# Patient Record
Sex: Female | Born: 1955 | Race: White | Hispanic: No | Marital: Married | Smoking: Never smoker
Health system: Southern US, Community
[De-identification: ages and names within clinical notes are randomized; demographics above are authoritative.]

## PROBLEM LIST (undated history)

## (undated) DIAGNOSIS — R87619 Unspecified abnormal cytological findings in specimens from cervix uteri: Secondary | ICD-10-CM

## (undated) DIAGNOSIS — I10 Essential (primary) hypertension: Secondary | ICD-10-CM

## (undated) DIAGNOSIS — E785 Hyperlipidemia, unspecified: Secondary | ICD-10-CM

## (undated) DIAGNOSIS — K861 Other chronic pancreatitis: Secondary | ICD-10-CM

## (undated) DIAGNOSIS — I491 Atrial premature depolarization: Secondary | ICD-10-CM

## (undated) DIAGNOSIS — K219 Gastro-esophageal reflux disease without esophagitis: Secondary | ICD-10-CM

## (undated) HISTORY — DX: Hyperlipidemia, unspecified: E78.5

## (undated) HISTORY — DX: Gastro-esophageal reflux disease without esophagitis: K21.9

## (undated) HISTORY — DX: Other chronic pancreatitis: K86.1

## (undated) HISTORY — PX: CERVICAL CONE BIOPSY: SUR198

## (undated) HISTORY — DX: Essential (primary) hypertension: I10

## (undated) HISTORY — DX: Atrial premature depolarization: I49.1

## (undated) HISTORY — DX: Unspecified abnormal cytological findings in specimens from cervix uteri: R87.619

---

## 1998-12-12 ENCOUNTER — Encounter: Admission: RE | Admit: 1998-12-12 | Discharge: 1998-12-12 | Payer: Self-pay | Admitting: Family Medicine

## 1998-12-12 ENCOUNTER — Encounter: Payer: Self-pay | Admitting: Family Medicine

## 2001-04-17 ENCOUNTER — Encounter: Admission: RE | Admit: 2001-04-17 | Discharge: 2001-04-17 | Payer: Self-pay | Admitting: Family Medicine

## 2001-04-17 ENCOUNTER — Encounter: Payer: Self-pay | Admitting: Family Medicine

## 2002-04-26 ENCOUNTER — Encounter: Payer: Self-pay | Admitting: Family Medicine

## 2002-04-26 ENCOUNTER — Encounter: Admission: RE | Admit: 2002-04-26 | Discharge: 2002-04-26 | Payer: Self-pay | Admitting: Family Medicine

## 2003-05-09 ENCOUNTER — Other Ambulatory Visit: Admission: RE | Admit: 2003-05-09 | Discharge: 2003-05-09 | Payer: Self-pay | Admitting: Family Medicine

## 2003-05-31 ENCOUNTER — Encounter: Admission: RE | Admit: 2003-05-31 | Discharge: 2003-05-31 | Payer: Self-pay | Admitting: Family Medicine

## 2004-01-30 ENCOUNTER — Ambulatory Visit (HOSPITAL_COMMUNITY): Admission: RE | Admit: 2004-01-30 | Discharge: 2004-01-30 | Payer: Self-pay | Admitting: Gastroenterology

## 2004-01-30 ENCOUNTER — Encounter (INDEPENDENT_AMBULATORY_CARE_PROVIDER_SITE_OTHER): Payer: Self-pay | Admitting: Specialist

## 2004-07-05 ENCOUNTER — Other Ambulatory Visit: Admission: RE | Admit: 2004-07-05 | Discharge: 2004-07-05 | Payer: Self-pay | Admitting: Gynecology

## 2005-03-12 ENCOUNTER — Encounter: Admission: RE | Admit: 2005-03-12 | Discharge: 2005-03-12 | Payer: Self-pay | Admitting: Family Medicine

## 2005-09-04 ENCOUNTER — Other Ambulatory Visit: Admission: RE | Admit: 2005-09-04 | Discharge: 2005-09-04 | Payer: Self-pay | Admitting: Family Medicine

## 2006-03-18 ENCOUNTER — Encounter: Admission: RE | Admit: 2006-03-18 | Discharge: 2006-03-18 | Payer: Self-pay | Admitting: Family Medicine

## 2006-09-02 ENCOUNTER — Other Ambulatory Visit: Admission: RE | Admit: 2006-09-02 | Discharge: 2006-09-02 | Payer: Self-pay | Admitting: Family Medicine

## 2007-04-21 ENCOUNTER — Encounter: Admission: RE | Admit: 2007-04-21 | Discharge: 2007-04-21 | Payer: Self-pay | Admitting: Family Medicine

## 2007-07-09 ENCOUNTER — Encounter: Admission: RE | Admit: 2007-07-09 | Discharge: 2007-07-09 | Payer: Self-pay | Admitting: Gastroenterology

## 2007-07-13 ENCOUNTER — Ambulatory Visit (HOSPITAL_COMMUNITY): Admission: RE | Admit: 2007-07-13 | Discharge: 2007-07-13 | Payer: Self-pay | Admitting: Gastroenterology

## 2007-11-05 ENCOUNTER — Other Ambulatory Visit: Admission: RE | Admit: 2007-11-05 | Discharge: 2007-11-05 | Payer: Self-pay | Admitting: Family Medicine

## 2008-04-26 ENCOUNTER — Encounter: Admission: RE | Admit: 2008-04-26 | Discharge: 2008-04-26 | Payer: Self-pay | Admitting: Family Medicine

## 2008-12-01 ENCOUNTER — Encounter: Admission: RE | Admit: 2008-12-01 | Discharge: 2008-12-01 | Payer: Self-pay | Admitting: Family Medicine

## 2009-01-16 ENCOUNTER — Other Ambulatory Visit: Admission: RE | Admit: 2009-01-16 | Discharge: 2009-01-16 | Payer: Self-pay | Admitting: Family Medicine

## 2009-05-17 ENCOUNTER — Encounter: Admission: RE | Admit: 2009-05-17 | Discharge: 2009-05-17 | Payer: Self-pay | Admitting: Family Medicine

## 2009-10-09 ENCOUNTER — Emergency Department (HOSPITAL_BASED_OUTPATIENT_CLINIC_OR_DEPARTMENT_OTHER): Admission: EM | Admit: 2009-10-09 | Discharge: 2009-10-09 | Payer: Self-pay | Admitting: Emergency Medicine

## 2010-04-02 ENCOUNTER — Other Ambulatory Visit: Payer: Self-pay | Admitting: Dermatology

## 2010-04-24 ENCOUNTER — Other Ambulatory Visit: Payer: Self-pay | Admitting: Family Medicine

## 2010-04-24 DIAGNOSIS — Z1231 Encounter for screening mammogram for malignant neoplasm of breast: Secondary | ICD-10-CM

## 2010-05-10 ENCOUNTER — Ambulatory Visit
Admission: RE | Admit: 2010-05-10 | Discharge: 2010-05-10 | Disposition: A | Discharge status: Home / Self Care | Payer: Commercial Indemnity | Source: Ambulatory Visit | Attending: Family Medicine | Admitting: Family Medicine

## 2010-05-10 DIAGNOSIS — Z1231 Encounter for screening mammogram for malignant neoplasm of breast: Secondary | ICD-10-CM

## 2010-05-11 LAB — DIFFERENTIAL
Basophils Absolute: 0 10*3/uL (ref 0.0–0.1)
Basophils Relative: 1 % (ref 0–1)
Eosinophils Absolute: 0 10*3/uL (ref 0.0–0.7)
Eosinophils Relative: 1 % (ref 0–5)
Lymphocytes Relative: 39 % (ref 12–46)
Lymphs Abs: 1.4 10*3/uL (ref 0.7–4.0)
Monocytes Absolute: 0.3 10*3/uL (ref 0.1–1.0)
Monocytes Relative: 7 % (ref 3–12)
Neutro Abs: 2 10*3/uL (ref 1.7–7.7)
Neutrophils Relative %: 53 % (ref 43–77)

## 2010-05-11 LAB — BASIC METABOLIC PANEL
BUN: 15 mg/dL (ref 6–23)
CO2: 20 mEq/L (ref 19–32)
Calcium: 9.7 mg/dL (ref 8.4–10.5)
Chloride: 107 mEq/L (ref 96–112)
Creatinine, Ser: 0.9 mg/dL (ref 0.4–1.2)
GFR calc Af Amer: >60 mL/min (ref >60)
GFR calc non Af Amer: >60 mL/min (ref >60)
Glucose, Bld: 92 mg/dL (ref 70–99)
Potassium: 3.2 mEq/L — ABNORMAL LOW (ref 3.5–5.1)
Sodium: 145 mEq/L (ref 135–145)

## 2010-05-11 LAB — CBC
HCT: 37.9 % (ref 36.0–46.0)
Hemoglobin: 13.1 g/dL (ref 12.0–15.0)
MCH: 31.4 pg (ref 26.0–34.0)
MCHC: 34.6 g/dL (ref 30.0–36.0)
MCV: 90.9 fL (ref 78.0–100.0)
Platelets: 148 10*3/uL — ABNORMAL LOW (ref 150–400)
RBC: 4.17 MIL/uL (ref 3.87–5.11)
RDW: 12.4 % (ref 11.5–15.5)
WBC: 3.7 10*3/uL — ABNORMAL LOW (ref 4.0–10.5)

## 2010-05-11 LAB — POCT CARDIAC MARKERS
CKMB, poc: <1 ng/mL — ABNORMAL LOW (ref 1.0–8.0)
CKMB, poc: <1 ng/mL — ABNORMAL LOW (ref 1.0–8.0)
Myoglobin, poc: 50.4 ng/mL (ref 12–200)
Myoglobin, poc: 62.8 ng/mL (ref 12–200)
Troponin i, poc: <0.05 ng/mL (ref 0.00–0.09)
Troponin i, poc: <0.05 ng/mL (ref 0.00–0.09)

## 2010-05-11 LAB — GLUCOSE, CAPILLARY: Glucose-Capillary: 100 mg/dL — ABNORMAL HIGH (ref 70–99)

## 2010-05-11 LAB — D-DIMER, QUANTITATIVE: D-Dimer, Quant: 0.32 ug/mL-FEU (ref 0.00–0.48)

## 2010-05-31 ENCOUNTER — Other Ambulatory Visit: Payer: Self-pay | Admitting: Family Medicine

## 2010-05-31 ENCOUNTER — Other Ambulatory Visit (HOSPITAL_COMMUNITY)
Admission: RE | Admit: 2010-05-31 | Discharge: 2010-05-31 | Disposition: A | Discharge status: Home / Self Care | Payer: Commercial Indemnity | Source: Ambulatory Visit | Attending: Family Medicine | Admitting: Family Medicine

## 2010-05-31 DIAGNOSIS — Z1159 Encounter for screening for other viral diseases: Secondary | ICD-10-CM | POA: Insufficient documentation

## 2010-05-31 DIAGNOSIS — Z124 Encounter for screening for malignant neoplasm of cervix: Secondary | ICD-10-CM | POA: Insufficient documentation

## 2010-06-12 ENCOUNTER — Other Ambulatory Visit: Payer: Self-pay | Admitting: Family Medicine

## 2010-06-12 ENCOUNTER — Ambulatory Visit
Admission: RE | Admit: 2010-06-12 | Discharge: 2010-06-12 | Disposition: A | Discharge status: Home / Self Care | Payer: Commercial Indemnity | Source: Ambulatory Visit | Attending: Family Medicine | Admitting: Family Medicine

## 2010-06-28 ENCOUNTER — Other Ambulatory Visit: Payer: Self-pay | Admitting: Family Medicine

## 2010-06-28 DIAGNOSIS — R1013 Epigastric pain: Secondary | ICD-10-CM

## 2010-07-03 ENCOUNTER — Ambulatory Visit
Admission: RE | Admit: 2010-07-03 | Discharge: 2010-07-03 | Disposition: A | Discharge status: Home / Self Care | Payer: Commercial Indemnity | Source: Ambulatory Visit | Attending: Family Medicine | Admitting: Family Medicine

## 2010-07-03 DIAGNOSIS — R1013 Epigastric pain: Secondary | ICD-10-CM

## 2010-07-03 MED ORDER — IOHEXOL 300 MG/ML  SOLN
100.0000 mL | Freq: Once | INTRAMUSCULAR | Status: AC | PRN
  Administered 2010-07-03: 100 mL via INTRAVENOUS

## 2010-07-04 ENCOUNTER — Other Ambulatory Visit (HOSPITAL_COMMUNITY): Payer: Self-pay | Admitting: Family Medicine

## 2010-07-04 DIAGNOSIS — R109 Unspecified abdominal pain: Secondary | ICD-10-CM

## 2010-07-10 NOTE — Op Note (Signed)
Felicia Obrien, Felicia Obrien           ACCOUNT NO.:  [id number]   MEDICAL RECORD NO.:  [id number]          PATIENT TYPE:  AMB   LOCATION:  ENDO                         FACILITY:  [facility]   PHYSICIAN:  Graylin Shiver, M.D.   DATE OF BIRTH:  [birth year]   DATE OF PROCEDURE:  07/13/2007  DATE OF DISCHARGE:                               OPERATIVE REPORT   INDICATIONS FOR PROCEDURE:  Dysphagia to solid foods, abnormal barium  swallow showing a Schatzki's ring which obstructed a 13-mm barium  tablet.   Informed consent was obtained after explanation of the risks of  bleeding, infection and perforation.   PREMEDICATION:  1. Fentanyl 100 mcg IV.  2. Versed 10 mg IV.   PROCEDURE:  With the patient in the left lateral decubitus position, the  Pentax gastroscope passed into the esophagus.  It was advanced down the  esophagus and then into the stomach and into the duodenum.  The second  portion and bulb of the duodenum looked normal.  The stomach mucosa  looked normal.  The esophagus was inspected.  There was a Schatzki's  ring.  This was at about 36 cm.  An endoscopic balloon dilator was  advanced down the scope.  It was appropriately placed at the level of  the ring.  It was dilated to 15 then 16.5 then 18 mm.  The balloon was  held in place at each level for 1 minute.  The balloon was then  deflated.  There was some blood noted at the site of the dilatation but  no other abnormalities.  The rest of the esophagus looked normal.  She  tolerated the procedure well without complications.   IMPRESSION:  Distal esophageal Schatzki's ring dilated to 18 mm.   PLAN:  We will observe the response to the dilatation.           ______________________________  Graylin Shiver, M.D.     SFG/MEDQ  D:  07/13/2007  T:  07/13/2007  Job:  [id number]   cc:   Caryn Bee L. Little, M.D.  Fax: [phone]

## 2010-07-13 NOTE — Op Note (Signed)
NAMELAKEIDRA, Felicia Obrien           ACCOUNT NO.:  [id number]   MEDICAL RECORD NO.:  [id number]          PATIENT TYPE:  AMB   LOCATION:  ENDO                         FACILITY:  [facility]   PHYSICIAN:  Graylin Shiver, M.D.   DATE OF BIRTH:  Dec 27, 1955   DATE OF PROCEDURE:  01/30/2004  DATE OF DISCHARGE:                                 OPERATIVE REPORT   PROCEDURE:  Colonoscopy with polypectomy and biopsy.   SURGEON:   INDICATIONS FOR PROCEDURE:  Rectal bleeding and family history of colon  polyps.   Informed consent was obtained after explanation of the risks of bleeding,  infection and perforation.   PREMEDICATION:  Fentanyl 100 mcg IV and Versed 10 mg IV.   DESCRIPTION OF PROCEDURE:  With the patient in the left lateral decubitus  position, a rectal examination was performed.  No masses were felt.  There  were external hemorrhoidal tags.  The Olympus colonoscope was inserted into  the rectum and advanced into the sigmoid colon.  In the mid sigmoid colon,  there was a 5 mm sessile polyp which was snared with a mini snare and  removed by snare cautery technique.  Cautery site looked good and the polyp  was retrieved.  In the proximal sigmoid, there was a small 3 mm polyp  biopsied off with cold forceps.  The scope was advanced around  the colon to  the cecum.  The cecal landmarks were identified.  The cecum and ascending  colon looked normal.  The transverse colon looked normal.  The descending  colon looked normal.  The rectum looked normal.  The scope was retroflexed  in the rectum.  No abnormalities were seen.  The scope was removed.  The  patient tolerated the procedure well without complications.   IMPRESSION:  1.  External hemorrhoids.  2.  Two small polyps in the sigmoid which were removed.   PLAN:  The pathology will be checked.      SFG/MEDQ  D:  01/30/2004  T:  01/30/2004  Job:  [id number]   cc:   Caryn Bee L. Little, M.D.  [address]  [place]  [place]  [address]  Fax: [phone]

## 2010-07-24 ENCOUNTER — Other Ambulatory Visit (HOSPITAL_COMMUNITY): Payer: Commercial Indemnity

## 2011-06-04 ENCOUNTER — Other Ambulatory Visit: Payer: Self-pay | Admitting: Family Medicine

## 2011-06-04 DIAGNOSIS — Z1231 Encounter for screening mammogram for malignant neoplasm of breast: Secondary | ICD-10-CM

## 2011-07-03 ENCOUNTER — Ambulatory Visit: Payer: Commercial Indemnity

## 2011-07-16 ENCOUNTER — Ambulatory Visit
Admission: RE | Admit: 2011-07-16 | Discharge: 2011-07-16 | Disposition: A | Discharge status: Home / Self Care | Payer: Commercial Indemnity | Source: Ambulatory Visit | Attending: Family Medicine | Admitting: Family Medicine

## 2011-07-16 DIAGNOSIS — Z1231 Encounter for screening mammogram for malignant neoplasm of breast: Secondary | ICD-10-CM

## 2012-07-23 ENCOUNTER — Other Ambulatory Visit: Payer: Self-pay

## 2012-07-23 DIAGNOSIS — Z1231 Encounter for screening mammogram for malignant neoplasm of breast: Secondary | ICD-10-CM

## 2012-08-26 ENCOUNTER — Ambulatory Visit
Admission: RE | Admit: 2012-08-26 | Discharge: 2012-08-26 | Disposition: A | Discharge status: Home / Self Care | Payer: Commercial Indemnity | Source: Ambulatory Visit

## 2012-08-26 DIAGNOSIS — Z1231 Encounter for screening mammogram for malignant neoplasm of breast: Secondary | ICD-10-CM

## 2013-06-01 ENCOUNTER — Encounter: Payer: Commercial Indemnity | Admitting: Interventional Cardiology

## 2013-08-18 ENCOUNTER — Other Ambulatory Visit: Payer: Self-pay | Admitting: Family Medicine

## 2013-08-18 DIAGNOSIS — N644 Mastodynia: Secondary | ICD-10-CM

## 2013-08-26 ENCOUNTER — Ambulatory Visit
Admission: RE | Admit: 2013-08-26 | Discharge: 2013-08-26 | Disposition: A | Discharge status: Home / Self Care | Payer: Managed Care, Other (non HMO) | Source: Ambulatory Visit | Attending: Family Medicine | Admitting: Family Medicine

## 2013-08-26 ENCOUNTER — Encounter (INDEPENDENT_AMBULATORY_CARE_PROVIDER_SITE_OTHER): Payer: Self-pay

## 2013-08-26 DIAGNOSIS — N644 Mastodynia: Secondary | ICD-10-CM

## 2013-12-24 ENCOUNTER — Other Ambulatory Visit: Payer: Self-pay | Admitting: Family Medicine

## 2013-12-24 DIAGNOSIS — N63 Unspecified lump in unspecified breast: Secondary | ICD-10-CM

## 2014-03-01 ENCOUNTER — Ambulatory Visit
Admission: RE | Admit: 2014-03-01 | Discharge: 2014-03-01 | Disposition: A | Discharge status: Home / Self Care | Payer: Managed Care, Other (non HMO) | Source: Ambulatory Visit | Attending: Family Medicine | Admitting: Family Medicine

## 2014-03-01 DIAGNOSIS — N63 Unspecified lump in unspecified breast: Secondary | ICD-10-CM

## 2014-12-22 ENCOUNTER — Other Ambulatory Visit: Payer: Self-pay

## 2014-12-22 DIAGNOSIS — Z1231 Encounter for screening mammogram for malignant neoplasm of breast: Secondary | ICD-10-CM

## 2015-01-13 ENCOUNTER — Ambulatory Visit
Admission: RE | Admit: 2015-01-13 | Discharge: 2015-01-13 | Disposition: A | Discharge status: Home / Self Care | Payer: Managed Care, Other (non HMO) | Source: Ambulatory Visit

## 2015-01-13 DIAGNOSIS — Z1231 Encounter for screening mammogram for malignant neoplasm of breast: Secondary | ICD-10-CM

## 2015-02-02 ENCOUNTER — Emergency Department (HOSPITAL_BASED_OUTPATIENT_CLINIC_OR_DEPARTMENT_OTHER): Payer: Managed Care, Other (non HMO)

## 2015-02-02 ENCOUNTER — Emergency Department (HOSPITAL_BASED_OUTPATIENT_CLINIC_OR_DEPARTMENT_OTHER)
Admission: EM | Admit: 2015-02-02 | Discharge: 2015-02-02 | Disposition: A | Discharge status: Home / Self Care | Payer: Managed Care, Other (non HMO) | Attending: Emergency Medicine | Admitting: Emergency Medicine

## 2015-02-02 ENCOUNTER — Encounter (HOSPITAL_BASED_OUTPATIENT_CLINIC_OR_DEPARTMENT_OTHER): Payer: Self-pay

## 2015-02-02 DIAGNOSIS — L729 Follicular cyst of the skin and subcutaneous tissue, unspecified: Secondary | ICD-10-CM

## 2015-02-02 DIAGNOSIS — Z8719 Personal history of other diseases of the digestive system: Secondary | ICD-10-CM | POA: Diagnosis not present

## 2015-02-02 DIAGNOSIS — Z7982 Long term (current) use of aspirin: Secondary | ICD-10-CM | POA: Insufficient documentation

## 2015-02-02 DIAGNOSIS — Z79899 Other long term (current) drug therapy: Secondary | ICD-10-CM | POA: Insufficient documentation

## 2015-02-02 DIAGNOSIS — M79605 Pain in left leg: Secondary | ICD-10-CM | POA: Diagnosis present

## 2015-02-02 DIAGNOSIS — E785 Hyperlipidemia, unspecified: Secondary | ICD-10-CM | POA: Diagnosis not present

## 2015-02-02 DIAGNOSIS — I10 Essential (primary) hypertension: Secondary | ICD-10-CM | POA: Insufficient documentation

## 2015-02-02 LAB — CBC WITH DIFFERENTIAL/PLATELET
Basophils Absolute: 0 10*3/uL (ref 0.0–0.1)
Basophils Relative: 0 %
Eosinophils Absolute: 0 10*3/uL (ref 0.0–0.7)
Eosinophils Relative: 1 %
HCT: 37.5 % (ref 36.0–46.0)
Hemoglobin: 12.4 g/dL (ref 12.0–15.0)
Lymphocytes Relative: 32 %
Lymphs Abs: 1.4 10*3/uL (ref 0.7–4.0)
MCH: 30.5 pg (ref 26.0–34.0)
MCHC: 33.1 g/dL (ref 30.0–36.0)
MCV: 92.1 fL (ref 78.0–100.0)
Monocytes Absolute: 0.3 10*3/uL (ref 0.1–1.0)
Monocytes Relative: 7 %
Neutro Abs: 2.5 10*3/uL (ref 1.7–7.7)
Neutrophils Relative %: 60 %
Platelets: 146 10*3/uL — ABNORMAL LOW (ref 150–400)
RBC: 4.07 MIL/uL (ref 3.87–5.11)
RDW: 12.2 % (ref 11.5–15.5)
WBC: 4.2 10*3/uL (ref 4.0–10.5)

## 2015-02-02 LAB — BASIC METABOLIC PANEL
Anion gap: 6 (ref 5–15)
BUN: 17 mg/dL (ref 6–20)
CO2: 26 mmol/L (ref 22–32)
Calcium: 9.4 mg/dL (ref 8.9–10.3)
Chloride: 108 mmol/L (ref 101–111)
Creatinine, Ser: 0.72 mg/dL (ref 0.44–1.00)
GFR calc Af Amer: >60 mL/min (ref >60)
GFR calc non Af Amer: >60 mL/min (ref >60)
Glucose, Bld: 98 mg/dL (ref 65–99)
Potassium: 3.9 mmol/L (ref 3.5–5.1)
Sodium: 140 mmol/L (ref 135–145)

## 2015-02-02 MED ORDER — DOXYCYCLINE HYCLATE 100 MG PO CAPS: 100.0000 mg | ORAL_CAPSULE | Freq: Two times a day (BID) | ORAL | Status: DC

## 2015-02-02 NOTE — ED Provider Notes (Signed)
CSN: [id number]     Arrival date & time 02/02/15  1558 History   First MD Initiated Contact with Patient 02/02/15 1627     Chief Complaint  Patient presents with  . Leg Pain     (Consider location/radiation/quality/duration/timing/severity/associated sxs/prior Treatment) Patient is a 59 y.o. female presenting with leg pain. The history is provided by the patient.  Leg Pain Associated symptoms: no fever    patient with a complaint of 2 bumps or nodules on her left leg one on the anterior part of the leg get a part on the medial part of the leg. Once been present for over a week the others been present for about 4 days. Denies any foot or ankle swelling. There is some redness to the 2 bumps. Patient is not sure what they're from. Never anything like this before. Denies any fevers. No joint aches. Mildly uncomfortable but no severe pain. In general to an ache and occasionally is sharp pain. No shortness of breath no chest pain.  Past Medical History  Diagnosis Date  . Hyperlipidemia   . PAC (premature atrial contraction)   . Hypertension   . GERD (gastroesophageal reflux disease)   . Abnormal Pap smear of cervix    Past Surgical History  Procedure Laterality Date  . Cervical cone biopsy     Family History  Problem Relation Age of Onset  . Cancer Mother   . Hyperlipidemia Father   . Hypertension Father   . Hyperlipidemia Sister   . Hyperlipidemia Brother    Social History  Substance Use Topics  . Smoking status: Never Smoker   . Smokeless tobacco: None  . Alcohol Use: No   OB History    No data available     Review of Systems  Constitutional: Negative for fever.  HENT: Negative for congestion.   Eyes: Negative for redness.  Cardiovascular: Positive for leg swelling. Negative for chest pain.  Gastrointestinal: Negative for abdominal pain.  Genitourinary: Negative for dysuria.  Musculoskeletal: Negative for joint swelling.  Skin: Negative for rash.  Neurological:  Negative for headaches.  Hematological: Does not bruise/bleed easily.  Psychiatric/Behavioral: Negative for confusion.      Allergies  Atorvastatin  Home Medications   Prior to Admission medications   Medication Sig Start Date End Date Taking? Authorizing Provider  ALPRAZolam Prudy Feeler) 0.25 MG tablet Take 0.25 mg by mouth at bedtime as needed for anxiety.    Historical Provider, MD  aspirin 81 MG tablet Take 81 mg by mouth daily.    Historical Provider, MD  atenolol (TENORMIN) 50 MG tablet Take 50 mg by mouth daily.    Historical Provider, MD  atorvastatin (LIPITOR) 10 MG tablet Take 10 mg by mouth daily.    Historical Provider, MD  Cholecalciferol (VITAMIN D) 2000 UNITS CAPS Take by mouth.    Historical Provider, MD  doxycycline (VIBRAMYCIN) 100 MG capsule Take 1 capsule (100 mg total) by mouth 2 (two) times daily. 02/02/15   Vanetta Mulders, MD  RA KRILL OIL CAPS Take by mouth.    Historical Provider, MD   BP 156/67 mmHg  Pulse 78  Temp(Src) 97.9 F (36.6 C) (Oral)  Resp 18  Ht  (1.575 m)  Wt 66.679 kg  BMI 26.88 kg/m2  SpO2 100% Physical Exam  Constitutional: She is oriented to person, place, and time. She appears well-developed and well-nourished. No distress.  HENT:  Head: Normocephalic and atraumatic.  Mouth/Throat: Oropharynx is clear and moist.  Eyes: Conjunctivae and EOM  are normal. Pupils are equal, round, and reactive to light.  Neck: Normal range of motion.  Cardiovascular: Normal rate, regular rhythm and normal heart sounds.   No murmur heard. Pulmonary/Chest: Effort normal and breath sounds normal. No respiratory distress.  Abdominal: Soft. Bowel sounds are normal. There is no tenderness.  Musculoskeletal: Normal range of motion. She exhibits tenderness.  2 areas of erythema measuring about 2 cm one is anterior shin the other is medial aspect of the calf. Both have a 1 cm nodule no fluctuance. Slight tenderness. Slight increase in warmth. No evidence of any  varicose veins. Dorsalis pedis pulse distally on the left leg is 2+. Sensation intact. No foot or ankle swelling.  Neurological: She is alert and oriented to person, place, and time. No cranial nerve deficit. She exhibits normal muscle tone. Coordination normal.  Skin: Skin is warm. There is erythema.  Nursing note and vitals reviewed.   ED Course  Procedures (including critical care time) Labs Review Labs Reviewed  CBC WITH DIFFERENTIAL/PLATELET - Abnormal; Notable for the following:    Platelets 146 (*)    All other components within normal limits  BASIC METABOLIC PANEL    Imaging Review [place] Venous Img Lower Unilateral Left  02/02/2015  CLINICAL DATA:  Two new lumps noted on left lower extremity. Initial encounter. EXAM: LEFT LOWER EXTREMITY VENOUS DOPPLER ULTRASOUND TECHNIQUE: Gray-scale sonography with graded compression, as well as color Doppler and duplex ultrasound were performed to evaluate the lower extremity deep venous systems from the level of the common femoral vein and including the common femoral, femoral, profunda femoral, popliteal and calf veins including the posterior tibial, peroneal and gastrocnemius veins when visible. The superficial great saphenous vein was also interrogated. Spectral Doppler was utilized to evaluate flow at rest and with distal augmentation maneuvers in the common femoral, femoral and popliteal veins. COMPARISON:  None. FINDINGS: Contralateral Common Femoral Vein: Respiratory phasicity is normal and symmetric with the symptomatic side. No evidence of thrombus. Normal compressibility. Common Femoral Vein: No evidence of thrombus. Normal compressibility, respiratory phasicity and response to augmentation. Saphenofemoral Junction: No evidence of thrombus. Normal compressibility and flow on color Doppler imaging. Profunda Femoral Vein: No evidence of thrombus. Normal compressibility and flow on color Doppler imaging. Femoral Vein: No evidence of thrombus. Normal  compressibility, respiratory phasicity and response to augmentation. Popliteal Vein: No evidence of thrombus. Normal compressibility, respiratory phasicity and response to augmentation. Calf Veins: No evidence of thrombus. Normal compressibility and flow on color Doppler imaging. Superficial Great Saphenous Vein: No evidence of thrombus. Normal compressibility and flow on color Doppler imaging. Venous Reflux:  None. Other Findings: There are two small mildly heterogeneous hypoechoic masses noted within the subcutaneous fat at the distal anterior lower leg and at the medial aspect of the ankle, measuring 0.6 cm and 0.5 cm. These are nonspecific and demonstrate no associated blood flow on limited Doppler evaluation. IMPRESSION: 1. No evidence of deep venous thrombosis. 2. Two small mildly heterogeneous hypoechoic masses noted within the subcutaneous fat at the distal anterior lower leg and at the medial aspect of the ankle, measuring 0.6 cm and 0.5 cm. These are nonspecific and demonstrate no associated blood flow. Somewhat complex lipomas could have such an appearance, or small pustules could also present in this manner. If the patient's symptoms persist or worsen, MRI could be considered for further evaluation. Electronically Signed   By: Roanna RaiderJeffery  Chang M.D.   On: 02/02/2015 19:12   I have personally reviewed and evaluated these  images and lab results as part of my medical decision-making.   EKG Interpretation None      MDM   Final diagnoses:  Skin cyst    Ultrasound shows no evidence of any deep vein problems or even superficial vein problems. The areas of erythema and nodular areas seem to be in the skin. Would recommend a seven-day course of doxycycline to see if it clears up. And follow-up with regular doctor. To create needs follow-up if they do not resolve or if they get worse. Labs also today without any significant abnormalities.  The nodules may be secondarily infected. Could represent an  inflamed skin cyst. Mild cellulitis noted nothing extensive. No fluctuance.    Vanetta Mulders, MD 02/02/15 [phone]

## 2015-02-02 NOTE — ED Notes (Signed)
Patient transported to Ultrasound 

## 2015-02-02 NOTE — ED Notes (Signed)
C/o 2 "bumps" to left LE-one area x 1-2 weeks-the other 4 days-NAD-steady gait in heels

## 2015-02-02 NOTE — Discharge Instructions (Signed)
Ultrasound of the left leg shows no evidence of any deep vein thrombosis or deep vein abnormalities also shows no evidence of any superficial vein problems at the location of the nodules. They feel that they're in the skin. Could be some mild secondary infection will treat with an antibiotic. Follow-up with your regular doctor would be important. Return for any new or worse symptoms.

## 2015-10-23 ENCOUNTER — Other Ambulatory Visit: Payer: Self-pay | Admitting: Gastroenterology

## 2015-10-23 DIAGNOSIS — R1013 Epigastric pain: Secondary | ICD-10-CM

## 2015-10-24 ENCOUNTER — Other Ambulatory Visit: Payer: Self-pay | Admitting: Gastroenterology

## 2015-10-24 ENCOUNTER — Ambulatory Visit
Admission: RE | Admit: 2015-10-24 | Discharge: 2015-10-24 | Disposition: A | Discharge status: Home / Self Care | Payer: Managed Care, Other (non HMO) | Source: Ambulatory Visit | Attending: Gastroenterology | Admitting: Gastroenterology

## 2015-10-24 DIAGNOSIS — K8689 Other specified diseases of pancreas: Secondary | ICD-10-CM

## 2015-10-24 DIAGNOSIS — R1013 Epigastric pain: Secondary | ICD-10-CM

## 2015-10-26 ENCOUNTER — Other Ambulatory Visit (HOSPITAL_COMMUNITY)
Admission: RE | Admit: 2015-10-26 | Discharge: 2015-10-26 | Disposition: A | Discharge status: Home / Self Care | Payer: Managed Care, Other (non HMO) | Source: Ambulatory Visit | Attending: Family Medicine | Admitting: Family Medicine

## 2015-10-26 ENCOUNTER — Other Ambulatory Visit: Payer: Self-pay | Admitting: Family Medicine

## 2015-10-26 DIAGNOSIS — Z124 Encounter for screening for malignant neoplasm of cervix: Secondary | ICD-10-CM | POA: Diagnosis present

## 2015-10-26 DIAGNOSIS — Z1151 Encounter for screening for human papillomavirus (HPV): Secondary | ICD-10-CM | POA: Diagnosis present

## 2015-10-27 LAB — CYTOLOGY - PAP

## 2015-11-05 ENCOUNTER — Ambulatory Visit
Admission: RE | Admit: 2015-11-05 | Discharge: 2015-11-05 | Disposition: A | Discharge status: Home / Self Care | Payer: Managed Care, Other (non HMO) | Source: Ambulatory Visit | Attending: Gastroenterology | Admitting: Gastroenterology

## 2015-11-05 DIAGNOSIS — K8689 Other specified diseases of pancreas: Secondary | ICD-10-CM

## 2015-11-05 MED ORDER — GADOBENATE DIMEGLUMINE 529 MG/ML IV SOLN
14.0000 mL | Freq: Once | INTRAVENOUS | Status: AC | PRN
  Administered 2015-11-05: 14 mL via INTRAVENOUS

## 2015-11-08 ENCOUNTER — Other Ambulatory Visit: Payer: Managed Care, Other (non HMO)

## 2015-12-20 ENCOUNTER — Inpatient Hospital Stay (HOSPITAL_COMMUNITY)
Admission: EM | Admit: 2015-12-20 | Discharge: 2015-12-25 | DRG: 393 | Disposition: A | Discharge status: Home / Self Care | Payer: Managed Care, Other (non HMO) | Attending: Family Medicine | Admitting: Family Medicine

## 2015-12-20 ENCOUNTER — Encounter (HOSPITAL_COMMUNITY): Payer: Self-pay

## 2015-12-20 ENCOUNTER — Emergency Department (HOSPITAL_COMMUNITY): Payer: Managed Care, Other (non HMO)

## 2015-12-20 DIAGNOSIS — R079 Chest pain, unspecified: Secondary | ICD-10-CM | POA: Diagnosis present

## 2015-12-20 DIAGNOSIS — R0789 Other chest pain: Secondary | ICD-10-CM | POA: Diagnosis present

## 2015-12-20 DIAGNOSIS — I10 Essential (primary) hypertension: Secondary | ICD-10-CM | POA: Diagnosis present

## 2015-12-20 DIAGNOSIS — R1011 Right upper quadrant pain: Secondary | ICD-10-CM

## 2015-12-20 DIAGNOSIS — Z7982 Long term (current) use of aspirin: Secondary | ICD-10-CM

## 2015-12-20 DIAGNOSIS — Z8249 Family history of ischemic heart disease and other diseases of the circulatory system: Secondary | ICD-10-CM

## 2015-12-20 DIAGNOSIS — R001 Bradycardia, unspecified: Secondary | ICD-10-CM | POA: Diagnosis not present

## 2015-12-20 DIAGNOSIS — Y838 Other surgical procedures as the cause of abnormal reaction of the patient, or of later complication, without mention of misadventure at the time of the procedure: Secondary | ICD-10-CM | POA: Diagnosis present

## 2015-12-20 DIAGNOSIS — E785 Hyperlipidemia, unspecified: Secondary | ICD-10-CM | POA: Diagnosis present

## 2015-12-20 DIAGNOSIS — K859 Acute pancreatitis without necrosis or infection, unspecified: Secondary | ICD-10-CM | POA: Diagnosis present

## 2015-12-20 DIAGNOSIS — Q453 Other congenital malformations of pancreas and pancreatic duct: Secondary | ICD-10-CM

## 2015-12-20 DIAGNOSIS — Z881 Allergy status to other antibiotic agents status: Secondary | ICD-10-CM

## 2015-12-20 DIAGNOSIS — I959 Hypotension, unspecified: Secondary | ICD-10-CM | POA: Diagnosis not present

## 2015-12-20 DIAGNOSIS — K9189 Other postprocedural complications and disorders of digestive system: Secondary | ICD-10-CM | POA: Diagnosis not present

## 2015-12-20 DIAGNOSIS — Z79899 Other long term (current) drug therapy: Secondary | ICD-10-CM

## 2015-12-20 DIAGNOSIS — Z86010 Personal history of colonic polyps: Secondary | ICD-10-CM

## 2015-12-20 DIAGNOSIS — K219 Gastro-esophageal reflux disease without esophagitis: Secondary | ICD-10-CM | POA: Diagnosis present

## 2015-12-20 DIAGNOSIS — K858 Other acute pancreatitis without necrosis or infection: Secondary | ICD-10-CM | POA: Diagnosis not present

## 2015-12-20 LAB — URINALYSIS, ROUTINE W REFLEX MICROSCOPIC
Bilirubin Urine: NEGATIVE
Glucose, UA: NEGATIVE mg/dL
Hgb urine dipstick: NEGATIVE
Ketones, ur: 40 mg/dL — AB
Leukocytes, UA: NEGATIVE
Nitrite: NEGATIVE
Protein, ur: NEGATIVE mg/dL
Specific Gravity, Urine: >1.046 — ABNORMAL HIGH (ref 1.005–1.030)
pH: 6 (ref 5.0–8.0)

## 2015-12-20 LAB — COMPREHENSIVE METABOLIC PANEL
ALT: 45 U/L (ref 14–54)
AST: 61 U/L — ABNORMAL HIGH (ref 15–41)
Albumin: 4 g/dL (ref 3.5–5.0)
Alkaline Phosphatase: 57 U/L (ref 38–126)
Anion gap: 11 (ref 5–15)
BUN: 9 mg/dL (ref 6–20)
CO2: 21 mmol/L — ABNORMAL LOW (ref 22–32)
Calcium: 9.7 mg/dL (ref 8.9–10.3)
Chloride: 108 mmol/L (ref 101–111)
Creatinine, Ser: 0.85 mg/dL (ref 0.44–1.00)
GFR calc Af Amer: >60 mL/min (ref >60)
GFR calc non Af Amer: >60 mL/min (ref >60)
Glucose, Bld: 107 mg/dL — ABNORMAL HIGH (ref 65–99)
Potassium: 3.7 mmol/L (ref 3.5–5.1)
Sodium: 140 mmol/L (ref 135–145)
Total Bilirubin: 1.1 mg/dL (ref 0.3–1.2)
Total Protein: 6.9 g/dL (ref 6.5–8.1)

## 2015-12-20 LAB — CBC
HCT: 37.4 % (ref 36.0–46.0)
Hemoglobin: 13 g/dL (ref 12.0–15.0)
MCH: 31.2 pg (ref 26.0–34.0)
MCHC: 34.8 g/dL (ref 30.0–36.0)
MCV: 89.7 fL (ref 78.0–100.0)
Platelets: 132 10*3/uL — ABNORMAL LOW (ref 150–400)
RBC: 4.17 MIL/uL (ref 3.87–5.11)
RDW: 12.6 % (ref 11.5–15.5)
WBC: 6.2 10*3/uL (ref 4.0–10.5)

## 2015-12-20 LAB — I-STAT TROPONIN, ED
Troponin i, poc: 0 ng/mL (ref 0.00–0.08)
Troponin i, poc: 0 ng/mL (ref 0.00–0.08)

## 2015-12-20 LAB — LIPASE, BLOOD: Lipase: 373 U/L — ABNORMAL HIGH (ref 11–51)

## 2015-12-20 LAB — TROPONIN I: Troponin I: <0.03 ng/mL (ref <0.03)

## 2015-12-20 MED ORDER — ALPRAZOLAM 0.25 MG PO TABS
0.2500 mg | ORAL_TABLET | Freq: Every evening | ORAL | Status: DC | PRN
Start: 2015-12-20 — End: 2015-12-25

## 2015-12-20 MED ORDER — ASPIRIN EC 81 MG PO TBEC: 81.0000 mg | DELAYED_RELEASE_TABLET | Freq: Every day | ORAL | Status: DC

## 2015-12-20 MED ORDER — ACETAMINOPHEN 325 MG PO TABS: 650.0000 mg | ORAL_TABLET | ORAL | Status: DC | PRN

## 2015-12-20 MED ORDER — IOPAMIDOL (ISOVUE-300) INJECTION 61%
INTRAVENOUS | Status: AC
  Administered 2015-12-20: 100 mL
  Filled 2015-12-20: qty 100

## 2015-12-20 MED ORDER — MORPHINE SULFATE (PF) 4 MG/ML IV SOLN
2.0000 mg | Freq: Once | INTRAVENOUS | Status: AC
  Administered 2015-12-20: 2 mg via INTRAVENOUS
  Filled 2015-12-20: qty 1

## 2015-12-20 MED ORDER — ONDANSETRON HCL 4 MG/2ML IJ SOLN
4.0000 mg | Freq: Once | INTRAMUSCULAR | Status: AC
  Administered 2015-12-20: 4 mg via INTRAVENOUS
  Filled 2015-12-20: qty 2

## 2015-12-20 MED ORDER — SODIUM CHLORIDE 0.9 % IV SOLN
INTRAVENOUS | Status: DC
  Administered 2015-12-20 – 2015-12-23 (×3): via INTRAVENOUS

## 2015-12-20 MED ORDER — ORAL CARE MOUTH RINSE
15.0000 mL | Freq: Two times a day (BID) | OROMUCOSAL | Status: DC
  Administered 2015-12-21 – 2015-12-23 (×3): 15 mL via OROMUCOSAL

## 2015-12-20 MED ORDER — MORPHINE SULFATE (PF) 2 MG/ML IV SOLN: 2.0000 mg | INTRAVENOUS | Status: DC | PRN

## 2015-12-20 MED ORDER — SODIUM CHLORIDE 0.9 % IV BOLUS (SEPSIS)
1000.0000 mL | Freq: Once | INTRAVENOUS | Status: AC
  Administered 2015-12-20: 1000 mL via INTRAVENOUS

## 2015-12-20 MED ORDER — ONDANSETRON HCL 4 MG/2ML IJ SOLN
4.0000 mg | Freq: Four times a day (QID) | INTRAMUSCULAR | Status: DC | PRN
  Administered 2015-12-22 (×3): 4 mg via INTRAVENOUS
  Filled 2015-12-20 (×3): qty 2

## 2015-12-20 MED ORDER — CHLORHEXIDINE GLUCONATE 0.12 % MT SOLN
15.0000 mL | Freq: Two times a day (BID) | OROMUCOSAL | Status: DC
  Administered 2015-12-21 – 2015-12-24 (×3): 15 mL via OROMUCOSAL
  Filled 2015-12-20 (×3): qty 15

## 2015-12-20 MED ORDER — ATORVASTATIN CALCIUM 10 MG PO TABS
10.0000 mg | ORAL_TABLET | Freq: Every day | ORAL | Status: DC
Start: 2015-12-21 — End: 2015-12-22
  Administered 2015-12-22: 10 mg via ORAL
  Filled 2015-12-20: qty 1

## 2015-12-20 NOTE — ED Notes (Signed)
Attempted to call report

## 2015-12-20 NOTE — ED Provider Notes (Signed)
By signing my name below, I, Octavia Heirrianna Nassar, attest that this documentation has been prepared under the direction and in the presence of Glynn OctaveStephen Rancour, MD.  Electronically Signed: Octavia HeirArianna Nassar, ED Scribe. 12/20/15. 5:13 PM.   HPI Comments: Felicia PieriniBarbara Obrien is a 60 y.o. female who has a PMhx of HTN and high cholesterol presents to the Emergency Department complaining of sudden onset, gradual worsening, moderate RUQ and epigastric abdominal pain ~ 3 hours ago. She has been having associated nausea and central chest pressure intermittently for ~ 1 hour. Pt had an ERCP done this morning by Dr[facility] Sh[facility]uteBurbridge at [facility] due to an abnormality on her MRI after having intermittent abdominal pain for a long period of time. Pt said her abdominal pain started ~ 2 hours after her procedure was done. She was told she had a possible pancreatic divisum. Denies fever, diarrhea, vomiting, abnormal vaginal discharge, vaginal bleeding, hx of cholecystectomy, hx of appendectomy, hx of MI, or hx of stents placed. Pt is not on any anticoagulants.    PE:  CTAB RRR Abdomen: RLQ and epigastric tenderness with guarding  Patient with epigastric and right upper quadrant pain with nausea after having [facility]CP done [facility] at [facility]. Denies vomiting or fever. Ultrasound done in August shows no gallstones.  Will treat symptoms, hydrate. Obtain imaging and d/w GI.  I personally performed the services described in this documentation, which was scribed in my presence. The recorded information has been reviewed and is accurate.    Glynn OctaveStephen Rancour, MD 12/20/15 [phone]

## 2015-12-20 NOTE — H&P (Signed)
Felicia Obrien ZOX:[id number] DOB: [birth year] DOA: 12/20/2015     PCP:  [facility]:Gi at [facility]   Dr. Madilyn Fireman Patient coming from:  home Lives  With family    Chief Complaint:  Epigastric pain and nausea  HPI: Felicia Obrien is a 60 y.o. female with medical history significant of HTN, HLD, Colon polyps, Pancreatic divisum (on MRICP).      Presented with epigastric pain severe after undergoing ERCP at [facility] today. Patient was sent to evaluation today [facility] by Dr. Madilyn Fireman in consultation for management of pancreatic Divisum. After undergoing procedure today patient developed nausea vomiting and epigastric abdominal pain she [facility] pain seems to be worse with eating. Nothing seems to make it better.   Review of notes appears to be that cannulation of minor pancreatic duct was unsuccessful. Results are as follows:         patient was discharged home on pain medication and antibiotics she was told there is 10-20% of acute pancreatitis following procedure patient's husband called GI at [facility] who recommended patient to come to [facility] patient having no bowel movements she's been endorse intermittent chest pressure and left not associated shortness of breath or coughing no fevers or chills no back pain no headaches. She reports episode of chest tightness when her abdominal pain was the worse it lasted 15 min. She reports having stress test done many years ago thinks it was negative.      Regarding pertinent Chronic problems: Patient has been seen by GI in the past secondary to intermittent epigastric pain Her workup in the past included an EGD and a Colonoscopy. He have had in the past elevated amylase and lipase level When her pain came back MRI was ordered that suggest pancreatic Divisum that . Examination has persistent lipase elevation above 1100 She has histories of the  esophageal dilatation past has been doing  well    IN ER:  Temp (24hrs), Avg:98.2 F (36.8 C), Min:98.2 F (36.8 C), Max:98.2 F (36.8 C)   Respirations 21 pulse 49 blood pressure 106/63 Troponin 0.00 Lipase 373 Cr  0.85 WBC 6.2 hemoglobin 13  CT of abdomen showing suspected pancreatic divisum out prominence of the dorsal pancreatic duct mass or atrophy Following Medications were ordered in ER: Medications  sodium chloride 0.9 % bolus 1,000 mL (0 mLs Intravenous Stopped 12/20/15 2012)  ondansetron (ZOFRAN) injection 4 mg (4 mg Intravenous Given 12/20/15 1713)  morphine 4 MG/ML injection 2 mg (2 mg Intravenous Given 12/20/15 1714)  iopamidol (ISOVUE-300) 61 % injection (100 mLs  Contrast Given 12/20/15 1733)     ER provider discussed case with:  [facility], [facility]  Hospitalist was called for admission for epigastric pain/possibly pancreatitis and chest pain  Review of Systems:    Pertinent positives include:  chest pain abdominal pain, nausea,  Constitutional:  No weight loss, night sweats, Fevers, chills, fatigue, weight loss  HEENT:  No headaches, Difficulty swallowing,Tooth/dental problems,Sore throat,  No sneezing, itching, ear ache, nasal congestion, post nasal drip,  Cardio-vascular:  No, Orthopnea, PND, anasarca, dizziness, palpitations.no Bilateral lower extremity swelling  GI:  No heartburn, indigestion,  vomiting, diarrhea, change in bowel habits, loss of appetite, melena, blood in stool, hematemesis Resp:  no shortness of breath at rest. No dyspnea on exertion, No excess mucus, no productive cough, No non-productive cough, No coughing up of blood.No change in color of mucus.No wheezing. Skin:  no rash or lesions. No jaundice GU:  no dysuria, change in color of urine, no urgency or frequency. No straining to urinate.  No flank pain.  Musculoskeletal:  No joint pain or no joint swelling. No decreased range of motion. No back pain.  Psych:  No change in mood or affect. No depression or anxiety. No  memory loss.  Neuro: no localizing neurological complaints, no tingling, no weakness, no double vision, no gait abnormality, no slurred speech, no confusion  As per HPI otherwise 10 point review of systems negative.   Past Medical History: Past Medical History:  Diagnosis Date  . Abnormal Pap smear of cervix   . GERD (gastroesophageal reflux disease)   . Hyperlipidemia   . Hypertension   . PAC (premature atrial contraction)    Past Surgical History:  Procedure Laterality Date  . CERVICAL CONE BIOPSY       Social History:  Ambulatory independently      reports that she has never smoked. She has never used smokeless tobacco. She reports that she does not drink alcohol or use drugs.  Allergies:   Allergies  Allergen Reactions  . Atorvastatin Other (See Comments)    Muscle cramps with dose over 10 mg        Family History:   Family History  Problem Relation Age of Onset  . Cancer Mother   . Hyperlipidemia Father   . Hypertension Father   . Hyperlipidemia Sister   . Hyperlipidemia Brother     Medications: Prior to Admission medications   Medication Sig Start Date End Date Taking? Authorizing Provider  ALPRAZolam Prudy Feeler(XANAX) 0.25 MG tablet Take 0.25 mg by mouth at bedtime as needed for anxiety.   Yes Historical Provider, MD  Ascorbic Acid (VITA-C PO) Take 1 tablet by mouth daily.   Yes Historical Provider, MD  aspirin 81 MG tablet Take 81 mg by mouth daily.   Yes Historical Provider, MD  atenolol (TENORMIN) 50 MG tablet Take 50 mg by mouth daily.   Yes Historical Provider, MD  atorvastatin (LIPITOR) 10 MG tablet Take 10 mg by mouth daily.   Yes Historical Provider, MD  B Complex Vitamins (VITAMIN B COMPLEX PO) Take 1 tablet by mouth daily.   Yes Historical Provider, MD  Cholecalciferol (VITAMIN D) 2000 UNITS CAPS Take 2,000 Units by mouth daily.    Yes Historical Provider, MD  Multiple Vitamin (MULTI-VITAMINS) TABS Take 1 tablet by mouth daily.   Yes Historical  Provider, MD  Probiotic Product (PROBIOTIC DAILY) CAPS Take 1 capsule by mouth daily.   Yes Historical Provider, MD  RA KRILL OIL CAPS Take 1 capsule by mouth daily.    Yes Historical Provider, MD  doxycycline (VIBRAMYCIN) 100 MG capsule Take 1 capsule (100 mg total) by mouth 2 (two) times daily. Patient not taking: Reported on 12/20/2015 02/02/15   Vanetta MuldersScott Zackowski, MD    Physical Exam: Patient Vitals for the past 24 hrs:  BP Temp Temp src Pulse Resp SpO2  12/20/15 2000 106/63 - - (!) 49 21 100 %  12/20/15 1930 118/75 - - (!) 45 15 99 %  12/20/15 1900 108/55 - - (!) 47 13 98 %  12/20/15 1845 - - - (!) 51 20 99 %  12/20/15 1837 137/56 - - (!) 51 15 100 %  12/20/15 1645 - - - (!) 48 14 100 %  12/20/15 1621 - 98.2 F (36.8 C) Oral - - -  12/20/15 1620 142/72 - - (!) 51 - 100 %  12/20/15 1600 111/59 - - Marland Kitchen(!)  48 - 99 %  12/20/15 1530 130/64 - - (!) 49 - 100 %  12/20/15 1458 143/62 - - 77 16 100 %    1. General:  in No Acute distress 2. Psychological: Alert and  Oriented 3. Head/ENT:    Dry Mucous Membranes                          Head Non traumatic, neck supple                          Normal   Dentition 4. SKIN:   decreased Skin turgor,  Skin clean Dry and intact no rash 5. Heart: Regular rate and rhythm no    Murmur, Rub or gallop 6. Lungs:  Clear to auscultation bilaterally, no wheezes or crackles   7. Abdomen: Soft,  Epigastrium tender, Non distended 8. Lower extremities: no clubbing, cyanosis, or edema 9. Neurologically Grossly intact, moving all 4 extremities equally  10. MSK: Normal range of motion   body mass index is unknown because there is no height or weight on file.  Labs on Admission:   Labs on Admission: I have personally reviewed following labs and imaging studies  CBC:  Recent Labs Lab 12/20/15 1511  WBC 6.2  HGB 13.0  HCT 37.4  MCV 89.7  PLT 132*   Basic Metabolic Panel:  Recent Labs Lab 12/20/15 1511  NA 140  K 3.7  CL 108  CO2 21*  GLUCOSE  107*  BUN 9  CREATININE 0.85  CALCIUM 9.7   GFR: CrCl cannot be calculated (Unknown ideal weight.). Liver Function Tests:  Recent Labs Lab 12/20/15 1511  AST 61*  ALT 45  ALKPHOS 57  BILITOT 1.1  PROT 6.9  ALBUMIN 4.0    Recent Labs Lab 12/20/15 1511  LIPASE 373*   No results for input(s): AMMONIA in the last 168 hours. Coagulation Profile: No results for input(s): INR, PROTIME in the last 168 hours. Cardiac Enzymes: No results for input(s): CKTOTAL, CKMB, CKMBINDEX, TROPONINI in the last 168 hours. BNP (last 3 results) No results for input(s): PROBNP in the last 8760 hours. HbA1C: No results for input(s): HGBA1C in the last 72 hours. CBG: No results for input(s): GLUCAP in the last 168 hours. Lipid Profile: No results for input(s): CHOL, HDL, LDLCALC, TRIG, CHOLHDL, LDLDIRECT in the last 72 hours. Thyroid Function Tests: No results for input(s): TSH, T4TOTAL, FREET4, T3FREE, THYROIDAB in the last 72 hours. Anemia Panel: No results for input(s): VITAMINB12, FOLATE, FERRITIN, TIBC, IRON, RETICCTPCT in the last 72 hours. Urine analysis:    Component Value Date/Time   COLORURINE YELLOW 12/20/2015 1833   APPEARANCEUR CLEAR 12/20/2015 1833   LABSPEC >1.046 (H) 12/20/2015 1833   PHURINE 6.0 12/20/2015 1833   GLUCOSEU NEGATIVE 12/20/2015 1833   HGBUR NEGATIVE 12/20/2015 1833   BILIRUBINUR NEGATIVE 12/20/2015 1833   KETONESUR 40 (A) 12/20/2015 1833   PROTEINUR NEGATIVE 12/20/2015 1833   NITRITE NEGATIVE 12/20/2015 1833   LEUKOCYTESUR NEGATIVE 12/20/2015 1833   Sepsis Labs: @LABRCNTIP (procalcitonin:4,lacticidven:4) )No results found for this or any previous visit (from the past 240 hour(s)).    UA  no evidence of UTI    No results found for: HGBA1C  CrCl cannot be calculated (Unknown ideal weight.).  BNP (last 3 results) No results for input(s): PROBNP in the last 8760 hours.   ECG REPORT  Independently reviewed Rate: 55  Rhythm: Sinus rhythm ST&T  Change: No acute ischemic changes   QTC 416  There were no vitals filed for this visit.   Cultures: No results found for: SDES, SPECREQUEST, CULT, REPTSTATUS   Radiological Exams on Admission: Ct Abdomen Pelvis W Contrast  Result Date: 12/20/2015 CLINICAL DATA:  Chronic right upper quadrant abdominal pain, increased today. Nausea. Status post ERCP today. EXAM: CT ABDOMEN AND PELVIS WITH CONTRAST TECHNIQUE: Multidetector CT imaging of the abdomen and pelvis was performed using the standard protocol following bolus administration of intravenous contrast. CONTRAST:  ISOVUE-300 IOPAMIDOL (ISOVUE-300) INJECTION 61% COMPARISON:  MRI abdomen dated 11/05/2015 FINDINGS: Lower chest: Lung bases clear. Hepatobiliary: Liver is within normal limits. Mild periportal edema. Vicarious excretion of contrast in the gallbladder. Pancreas: Suspected pancreatic divisum with mild prominence of the dorsal pancreatic duct, measuring 6 mm (series 2/ image 32). No associated pancreatic mass or atrophy. Spleen: Within normal limits. Adrenals/Urinary Tract: Adrenal glands are within normal limits. 4 mm right upper pole renal cyst (series 2/ image 32). Left kidney is within normal limits. No hydronephrosis. Bladder is within normal limits. Stomach/Bowel: Stomach is within normal limits. No evidence of bowel obstruction. Normal appendix (series 2/ image 56). Vascular/Lymphatic: Atherosclerotic calcifications of the abdominal aorta and branch vessels. No evidence of abdominal aortic aneurysm. No suspicious abdominopelvic lymphadenopathy. Reproductive: Uterus is within normal limits. Left ovary is within normal limits.  No right adnexal mass. Other: Trace pelvic ascites (series 2/image 70). Musculoskeletal: Very mild degenerative changes of the lower thoracic spine. IMPRESSION: Suspected pancreatic divisum with stable mild prominence of the dorsal pancreatic duct. Vicarious excretion of contrast in the gallbladder. Mild  periportal edema. Electronically Signed   By: Charline Bills M.D.   On: 12/20/2015 17:56   Dg Abdomen Acute W/chest  Result Date: 12/20/2015 CLINICAL DATA:  Right upper quadrant pain after endoscopy today. EXAM: DG ABDOMEN ACUTE W/ 1V CHEST COMPARISON:  None. FINDINGS: There is no evidence of dilated bowel loops or free intraperitoneal air. No radiopaque calculi or other significant radiographic abnormality is seen. Contrast is seen within both renal collecting systems and bladder without evidence of obstruction. Opacification of the gallbladder is also seen presumably from same day endoscopic procedure. Heart size and mediastinal contours are within normal limits. Both lungs are clear. IMPRESSION: Negative abdominal radiographs.  No acute cardiopulmonary disease. Electronically Signed   By: Tollie Eth M.D.   On: 12/20/2015 18:35    Chart has been reviewed    Assessment/Plan  60 y.o. female with medical history significant of HTN, HLD, Colon polyps, Pancreatic divisum (on MRICP). Being admitted for possible mild pancreatitis and chest pain    Present on Admission: . Pancreatitis clinically post ERCP, appreciate Gi consult keep NPO, administer IVF . Hypertension - hold Atenolol for tonight . Chest pain  - - given risk factors will admit, monitor on telemetry, cycle cardiac enzymes, obtain serial ECG. Further risk stratify with lipid panel, hgA1C, obtain TSH. Make sure patient is on Aspirin. Further treatment based on the currently pending results.   . Bradycardia hold atenolol would restart at a lower dose if able to to tolerate   Other plan as per orders.  DVT prophylaxis:  SCD   Code Status:  FULL CODE  as per patient    Family Communication:   Family   at  Bedside  plan of care was discussed with  Husband,    Disposition Plan:    To home once workup is complete and patient is stable  Consults called: [facility]   [place]  Admission status:    obs   Level of care    tele          I have spent a total of 56 min on this admission    Sharnetta Gielow 12/20/2015, 9:47 PM    [facility]  Pager [phone]   after 2 AM please page floor coverage PA If 7AM-7PM, please contact the day team taking care of the patient  [email or web address]  Password [email or web address]

## 2015-12-20 NOTE — Progress Notes (Signed)
New Admission Note:  Arrival Method: Stretcher from ED Mental Orientation: A&O x4 Telemetry: Tele box #15, CCMD notified Assessment: Completed Skin: Assessed with Clement Sayresathie Wicker, RN, intact. IV: Left Forearm, saline locked Pain: 0/10 Tubes: N/A Safety Measures: Safety Fall Prevention Plan discussed with patient Admission: Completed [address] Orientation: Patient has been orientated to the room, unit and the staff. Family: None at bedside  Orders have been reviewed and implemented. Will continue to monitor the patient. Call light has been placed within reach.  Rivka BarbaraZenab Mahmoud BSN, RN  Phone Number: [phone]

## 2015-12-20 NOTE — ED Triage Notes (Signed)
[facility] and was told she has a pancreatic divisum. She reports abd pain and nausea.

## 2015-12-20 NOTE — ED Provider Notes (Signed)
[facility]-EMERGENCY DEPT Provider Note   CSN: [id number] Arrival date & time: 12/20/15  1452     History   Chief Complaint Chief Complaint  Patient presents with  . Pancreatitis    HPI Felicia Obrien is a 60 y.o. female.  Felicia Obrien is a 60 y.o. female with h/o HTN, HCHOL, GERD presents to ED with complaint of abdominal pain. Patient underwent ERCP today at [facility] for chronic intermittent abdominal pain x 5 years. ERCP noted pancreatic divisum and impression notes state cannulation of minor pancreatic duct unsuccessful. Patient was discharged home with pain medication and ABX. She was told there is a 10-20% chance of acute pancreatitis following procedure. Patient and husband notified Dr. Shana Chute, who completed ERCP, and told to come to the ED for evaluation. She presents now with worsening epigastric RUQ abdominal pain. Pain is non-radiating, constant, 9/10, dull ache with sharp pains. She endorses associated nausea, no vomiting. She has not had a bowel movement yet. Patient also endorses intermittent chest pressure in left side of chest. Denies SOB or cough. She denies fever, trouble swallowing, shortness of breath, cough, neck pain, back pain, LOC, or headaches.       Past Medical History:  Diagnosis Date  . Abnormal Pap smear of cervix   . GERD (gastroesophageal reflux disease)   . Hyperlipidemia   . Hypertension   . PAC (premature atrial contraction)     Patient Active Problem List   Diagnosis Date Noted  . Pancreatitis 12/20/2015  . Hypertension 12/20/2015    Past Surgical History:  Procedure Laterality Date  . CERVICAL CONE BIOPSY      OB History    No data available       Home Medications    Prior to Admission medications   Medication Sig Start Date End Date Taking? Authorizing Provider  ALPRAZolam Prudy Feeler) 0.25 MG tablet Take 0.25 mg by mouth at bedtime as needed for anxiety.   Yes Historical Provider, MD  Ascorbic Acid (VITA-C PO) Take 1 tablet by  mouth daily.   Yes Historical Provider, MD  aspirin 81 MG tablet Take 81 mg by mouth daily.   Yes Historical Provider, MD  atenolol (TENORMIN) 50 MG tablet Take 50 mg by mouth daily.   Yes Historical Provider, MD  atorvastatin (LIPITOR) 10 MG tablet Take 10 mg by mouth daily.   Yes Historical Provider, MD  B Complex Vitamins (VITAMIN B COMPLEX PO) Take 1 tablet by mouth daily.   Yes Historical Provider, MD  Cholecalciferol (VITAMIN D) 2000 UNITS CAPS Take 2,000 Units by mouth daily.    Yes Historical Provider, MD  Multiple Vitamin (MULTI-VITAMINS) TABS Take 1 tablet by mouth daily.   Yes Historical Provider, MD  Probiotic Product (PROBIOTIC DAILY) CAPS Take 1 capsule by mouth daily.   Yes Historical Provider, MD  RA KRILL OIL CAPS Take 1 capsule by mouth daily.    Yes Historical Provider, MD  doxycycline (VIBRAMYCIN) 100 MG capsule Take 1 capsule (100 mg total) by mouth 2 (two) times daily. Patient not taking: Reported on 12/20/2015 02/02/15   Vanetta Mulders, MD    Family History Family History  Problem Relation Age of Onset  . Cancer Mother   . Hyperlipidemia Father   . Hypertension Father   . Hyperlipidemia Sister   . Hyperlipidemia Brother     Social History Social History  Substance Use Topics  . Smoking status: Never Smoker  . Smokeless tobacco: Never Used  . Alcohol use No  Allergies   Atorvastatin   Review of Systems Review of Systems  Constitutional: Negative for fever.  HENT: Negative for trouble swallowing.   Eyes: Negative for visual disturbance.  Respiratory: Negative for cough and shortness of breath.   Cardiovascular: Positive for chest pain and palpitations. Negative for leg swelling.  Gastrointestinal: Positive for abdominal pain and nausea. Negative for vomiting.  Genitourinary: Negative for dysuria and hematuria.  Musculoskeletal: Negative for back pain.  Skin: Negative for rash.  Neurological: Negative for syncope and headaches.     Physical  Exam Updated Vital Signs BP 106/63   Pulse (!) 49   Temp 98.2 F (36.8 C) (Oral)   Resp 21   SpO2 100%   Physical Exam  Constitutional: She appears well-developed and well-nourished. No distress.  HENT:  Head: Normocephalic and atraumatic.  Mouth/Throat: Oropharynx is clear and moist. No oropharyngeal exudate.  Eyes: Conjunctivae and EOM are normal. Pupils are equal, round, and reactive to light. Right eye exhibits no discharge. Left eye exhibits no discharge. No scleral icterus.  Neck: Normal range of motion and phonation normal. Neck supple. No neck rigidity. Normal range of motion present.  Cardiovascular: Normal rate, regular rhythm, normal heart sounds and intact distal pulses.   No murmur heard. Pulmonary/Chest: Effort normal and breath sounds normal. No stridor. No respiratory distress. She has no wheezes. She has no rales.  Abdominal: Soft. She exhibits no distension. Bowel sounds are decreased. There is tenderness in the right upper quadrant and epigastric area. There is guarding. There is no rigidity and no rebound.  Musculoskeletal: Normal range of motion.  Lymphadenopathy:    She has no cervical adenopathy.  Neurological: She is alert. She is not disoriented. Coordination and gait normal. GCS eye subscore is 4. GCS verbal subscore is 5. GCS motor subscore is 6.  Skin: Skin is warm and dry. She is not diaphoretic.  Psychiatric: She has a normal mood and affect. Her behavior is normal.     ED Treatments / Results  Labs (all labs ordered are listed, but only abnormal results are displayed) Labs Reviewed  LIPASE, BLOOD - Abnormal; Notable for the following:       Result Value   Lipase 373 (*)    All other components within normal limits  COMPREHENSIVE METABOLIC PANEL - Abnormal; Notable for the following:    CO2 21 (*)    Glucose, Bld 107 (*)    AST 61 (*)    All other components within normal limits  CBC - Abnormal; Notable for the following:    Platelets 132 (*)      All other components within normal limits  URINALYSIS, ROUTINE W REFLEX MICROSCOPIC (NOT AT [facility]) - Abnormal; Notable for the following:    Specific Gravity, Urine >1.046 (*)    Ketones, ur 40 (*)    All other components within normal limits  I-STAT TROPOININ, ED  I-STAT TROPOININ, ED    EKG  EKG Interpretation  Date/Time:  Wednesday December 20 2015 16:38:38 EDT Ventricular Rate:  55 PR Interval:    QRS Duration: 102 QT Interval:  434 QTC Calculation: 416 R Axis:   44 Text Interpretation:  Sinus rhythm Low voltage, precordial leads Nonspecific ST abnormality Confirmed by Manus GunningANCOUR  MD, Jeannett SeniorSTEPHEN [id number]) on 12/20/2015 5:10:10 PM Also confirmed by Manus GunningANCOUR  MD, STEPHEN [phone]), editor [place], Cala BradfordKIMBERLY [phone])  on 12/20/2015 5:13:56 PM       Radiology Ct Abdomen Pelvis W Contrast  Result Date: 12/20/2015 CLINICAL DATA:  Chronic  right upper quadrant abdominal pain, increased today. Nausea. Status post ERCP today. EXAM: CT ABDOMEN AND PELVIS WITH CONTRAST TECHNIQUE: Multidetector CT imaging of the abdomen and pelvis was performed using the standard protocol following bolus administration of intravenous contrast. CONTRAST:  ISOVUE-300 IOPAMIDOL (ISOVUE-300) INJECTION 61% COMPARISON:  MRI abdomen dated 11/05/2015 FINDINGS: Lower chest: Lung bases clear. Hepatobiliary: Liver is within normal limits. Mild periportal edema. Vicarious excretion of contrast in the gallbladder. Pancreas: Suspected pancreatic divisum with mild prominence of the dorsal pancreatic duct, measuring 6 mm (series 2/ image 32). No associated pancreatic mass or atrophy. Spleen: Within normal limits. Adrenals/Urinary Tract: Adrenal glands are within normal limits. 4 mm right upper pole renal cyst (series 2/ image 32). Left kidney is within normal limits. No hydronephrosis. Bladder is within normal limits. Stomach/Bowel: Stomach is within normal limits. No evidence of bowel obstruction. Normal appendix (series 2/ image 56).  Vascular/Lymphatic: Atherosclerotic calcifications of the abdominal aorta and branch vessels. No evidence of abdominal aortic aneurysm. No suspicious abdominopelvic lymphadenopathy. Reproductive: Uterus is within normal limits. Left ovary is within normal limits.  No right adnexal mass. Other: Trace pelvic ascites (series 2/image 70). Musculoskeletal: Very mild degenerative changes of the lower thoracic spine. IMPRESSION: Suspected pancreatic divisum with stable mild prominence of the dorsal pancreatic duct. Vicarious excretion of contrast in the gallbladder. Mild periportal edema. Electronically Signed   By: Charline Bills M.D.   On: 12/20/2015 17:56   Dg Abdomen Acute W/chest  Result Date: 12/20/2015 CLINICAL DATA:  Right upper quadrant pain after endoscopy today. EXAM: DG ABDOMEN ACUTE W/ 1V CHEST COMPARISON:  None. FINDINGS: There is no evidence of dilated bowel loops or free intraperitoneal air. No radiopaque calculi or other significant radiographic abnormality is seen. Contrast is seen within both renal collecting systems and bladder without evidence of obstruction. Opacification of the gallbladder is also seen presumably from same day endoscopic procedure. Heart size and mediastinal contours are within normal limits. Both lungs are clear. IMPRESSION: Negative abdominal radiographs.  No acute cardiopulmonary disease. Electronically Signed   By: Tollie Eth M.D.   On: 12/20/2015 18:35    Procedures Procedures (including critical care time)  Medications Ordered in ED Medications  sodium chloride 0.9 % bolus 1,000 mL (0 mLs Intravenous Stopped 12/20/15 2012)  ondansetron (ZOFRAN) injection 4 mg (4 mg Intravenous Given 12/20/15 1713)  morphine 4 MG/ML injection 2 mg (2 mg Intravenous Given 12/20/15 1714)  iopamidol (ISOVUE-300) 61 % injection (100 mLs  Contrast Given 12/20/15 1733)     Initial Impression / Assessment and Plan / ED Course  I have reviewed the triage vital signs and the  nursing notes.  Pertinent labs & imaging results that were available during my care of the patient were reviewed by me and considered in my medical decision making (see chart for details).  Clinical Course  Value Comment By Time  CT Abdomen Pelvis W Contrast Reviewed  Lona Kettle, PA-C 10/25 1900  DG Abdomen Acute W/Chest Normal cardiac silhouette. No evidence of consolidation, effusion, or PTX. No free air under diaphragm.  Lona Kettle, [place] 10/25 1905   On re-evaluation patient endorses improvement in pain.  Lona Kettle, [place] 10/25 2044    Patient presents to ED with complaint of abdominal pain and nausea onset today s/p ERCP; of note patient also complained of chest pain onset today, intermittent, described as a pressure. Patient is afebrile and non-toxic appearing in NAD. VSS. Physical exam remarkable for TTP of epigastric and  RUQ with mild guarding. No peritoneal signs. No rigidity. IVF and pain medication initiated. Will check labs, EKG, and CXR. Given recent procedure will order CT abd/pelvis to r/o post-procedure complications. Discussed patient with Dr. Manus Gunning who also saw patient, agrees with plan.   Labs remarkable for elevated lipase - most likely consistent with acute pancreatitis secondary to ERCP. CBC and CMP grossly nml. Delta trop negative. EKG shows sinus rhythm with non-specific ST abnormality. Acute abd/chest nml. CT abd/pelvis remarkable for pancreatic divisum with stable mild dorsal pancreatic prominence. Will consult Dr. Shana Chute of [facility] who completed procedure today for further management.   Spoke with Dr. Shana Chute, gastroenterology, recommends admission for acute pancreatitis secondary to ERCP. Will consult hospitalist for admission for further management of pancreatitis.   8:58 PM: Spoke with Dr. Adela Glimpse of [facility], greatly appreciate her time. Agree to admit patient for further management of pancreatitis most likely secondary to recent ERCP.  Thank you for your continued care of this patient.   9:48 PM: Spoke with Dr. Levora Angel of GI, greatly appreciate his time and input. Recommend IVF, NPO, and pain medication. Will see pt in AM. T  Final Clinical Impressions(s) / ED Diagnoses   Final diagnoses:  RUQ pain  Acute pancreatitis without infection or necrosis, unspecified pancreatitis type    New Prescriptions New Prescriptions   No medications on file     Lona Kettle, PA-C 12/20/15 [address] [place], PA-C 12/20/15 2149    Glynn Octave, MD 12/20/15 2340

## 2015-12-21 ENCOUNTER — Observation Stay (HOSPITAL_BASED_OUTPATIENT_CLINIC_OR_DEPARTMENT_OTHER): Payer: Managed Care, Other (non HMO)

## 2015-12-21 DIAGNOSIS — R001 Bradycardia, unspecified: Secondary | ICD-10-CM

## 2015-12-21 DIAGNOSIS — K858 Other acute pancreatitis without necrosis or infection: Secondary | ICD-10-CM | POA: Diagnosis not present

## 2015-12-21 DIAGNOSIS — Z79899 Other long term (current) drug therapy: Secondary | ICD-10-CM | POA: Diagnosis not present

## 2015-12-21 DIAGNOSIS — Z8249 Family history of ischemic heart disease and other diseases of the circulatory system: Secondary | ICD-10-CM | POA: Diagnosis not present

## 2015-12-21 DIAGNOSIS — Z881 Allergy status to other antibiotic agents status: Secondary | ICD-10-CM | POA: Diagnosis not present

## 2015-12-21 DIAGNOSIS — R079 Chest pain, unspecified: Secondary | ICD-10-CM

## 2015-12-21 DIAGNOSIS — R0789 Other chest pain: Secondary | ICD-10-CM | POA: Diagnosis present

## 2015-12-21 DIAGNOSIS — K219 Gastro-esophageal reflux disease without esophagitis: Secondary | ICD-10-CM | POA: Diagnosis present

## 2015-12-21 DIAGNOSIS — K9189 Other postprocedural complications and disorders of digestive system: Secondary | ICD-10-CM | POA: Diagnosis present

## 2015-12-21 DIAGNOSIS — K859 Acute pancreatitis without necrosis or infection, unspecified: Secondary | ICD-10-CM | POA: Diagnosis present

## 2015-12-21 DIAGNOSIS — I10 Essential (primary) hypertension: Secondary | ICD-10-CM | POA: Diagnosis present

## 2015-12-21 DIAGNOSIS — Z86010 Personal history of colonic polyps: Secondary | ICD-10-CM | POA: Diagnosis not present

## 2015-12-21 DIAGNOSIS — R1011 Right upper quadrant pain: Secondary | ICD-10-CM | POA: Diagnosis present

## 2015-12-21 DIAGNOSIS — Q453 Other congenital malformations of pancreas and pancreatic duct: Secondary | ICD-10-CM | POA: Diagnosis not present

## 2015-12-21 DIAGNOSIS — I959 Hypotension, unspecified: Secondary | ICD-10-CM | POA: Diagnosis not present

## 2015-12-21 DIAGNOSIS — Z7982 Long term (current) use of aspirin: Secondary | ICD-10-CM | POA: Diagnosis not present

## 2015-12-21 DIAGNOSIS — Y838 Other surgical procedures as the cause of abnormal reaction of the patient, or of later complication, without mention of misadventure at the time of the procedure: Secondary | ICD-10-CM | POA: Diagnosis present

## 2015-12-21 DIAGNOSIS — E785 Hyperlipidemia, unspecified: Secondary | ICD-10-CM | POA: Diagnosis present

## 2015-12-21 LAB — LIPASE, BLOOD: Lipase: 312 U/L — ABNORMAL HIGH (ref 11–51)

## 2015-12-21 LAB — TROPONIN I
Troponin I: <0.03 ng/mL (ref <0.03)
Troponin I: <0.03 ng/mL (ref <0.03)

## 2015-12-21 LAB — ECHOCARDIOGRAM COMPLETE
Height: 62 in
Weight: 2384.5 oz

## 2015-12-21 NOTE — Progress Notes (Signed)
  Echocardiogram 2D Echocardiogram has been performed.  Cathie BeamsGREGORY, ANGELA 12/21/2015, 10:38 AM

## 2015-12-21 NOTE — Consult Note (Signed)
[facility] Reason for consult: pancreatitis Referring Physician: [facility] hospitalist. PCP: Dr. Hulan Fess. Primary G.I.: Dr. Teena Irani  Felicia Obrien is an 60 y.o. female.  HPI: 60 year old woman whom recently developed abdominal pain and has seen Dr. Amedeo Plenty for this. She's had elevated lipase and amylase on several episodes and MRCP suggested pancreas divisum. Her initial episode was in her 84s but she has gotten progressively worse. MRCP clearly suggested pancreas divisum in the patient was subsequently referred to Dr Mont Dutton at [facility] for definitive treatment. There was question of stricturing of the [email or web address] MRCP and it was felt that a minor [place] me and ductal dilatation would be appropriate. She had ERCP yesterday by Dr.Burbridge at [facility]. She was clearly found to have pancreas divisum. Unfortunately the minor papilla could not be cannulated despite the use of a precut sphincterotomy 04 mm using the real-life. The plan was to have the patient's return at a later date to repeat the procedure. The ventral pancreatic duct confirmed a complete pancreas divisum. A cholangiogram was noted to be normal with no lesions and no CBD stones. The patient returned home to [facility] and develop severe epigastric pain and nausea and vomiting was admitted to the hospital. CT showed slight swelling of the pancreas but no abscess or anything of this nature. Her lipase was elevated it 312. The patient remains in PO and is receiving Xanax and morphine. She reports that she does feel a bit better. She is due to go back in one week to have her ERCP repeated at [facility].  Past Medical History:  Diagnosis Date  . Abnormal Pap smear of cervix   . GERD (gastroesophageal reflux disease)   . Hyperlipidemia   . Hypertension   . PAC (premature atrial contraction)     Past Surgical History:  Procedure Laterality Date  . CERVICAL CONE BIOPSY      Family History   Problem Relation Age of Onset  . Cancer Mother   . Hyperlipidemia Father   . Hypertension Father   . Hyperlipidemia Sister   . Hyperlipidemia Brother     Social History:  reports that she has never smoked. She has never used smokeless tobacco. She reports that she does not drink alcohol or use drugs.  Allergies:  Allergies  Allergen Reactions  . Atorvastatin Other (See Comments)    Muscle cramps with dose over 10 mg     Medications; Prior to Admission medications   Medication Sig Start Date End Date Taking? Authorizing Provider  ALPRAZolam Duanne Moron) 0.25 MG tablet Take 0.25 mg by mouth at bedtime as needed for anxiety.   Yes Historical Provider, MD  Ascorbic Acid (VITA-C PO) Take 1 tablet by mouth daily.   Yes Historical Provider, MD  aspirin 81 MG tablet Take 81 mg by mouth daily.   Yes Historical Provider, MD  atenolol (TENORMIN) 50 MG tablet Take 50 mg by mouth daily.   Yes Historical Provider, MD  atorvastatin (LIPITOR) 10 MG tablet Take 10 mg by mouth daily.   Yes Historical Provider, MD  B Complex Vitamins (VITAMIN B COMPLEX PO) Take 1 tablet by mouth daily.   Yes Historical Provider, MD  Cholecalciferol (VITAMIN D) 2000 UNITS CAPS Take 2,000 Units by mouth daily.    Yes Historical Provider, MD  Multiple Vitamin (MULTI-VITAMINS) TABS Take 1 tablet by mouth daily.   Yes Historical Provider, MD  Probiotic Product (PROBIOTIC DAILY) CAPS Take 1 capsule by mouth daily.   Yes Historical Provider, MD  RA KRILL OIL CAPS Take 1 capsule by mouth daily.    Yes Historical Provider, MD  doxycycline (VIBRAMYCIN) 100 MG capsule Take 1 capsule (100 mg total) by mouth 2 (two) times daily. Patient not taking: Reported on 12/20/2015 02/02/15   Fredia Sorrow, MD   . aspirin EC  81 mg Oral Daily  . atorvastatin  10 mg Oral Daily  . chlorhexidine  15 mL Mouth Rinse BID  . mouth rinse  15 mL Mouth Rinse q12n4p   PRN Meds acetaminophen, ALPRAZolam, morphine injection, ondansetron (ZOFRAN)  IV Results for orders placed or performed during the hospital encounter of 12/20/15 (from the past 48 hour(s))  Lipase, blood     Status: Abnormal   Collection Time: 12/20/15  3:11 PM  Result Value Ref Range   Lipase 373 (H) 11 - 51 U/L  Comprehensive metabolic panel     Status: Abnormal   Collection Time: 12/20/15  3:11 PM  Result Value Ref Range   Sodium 140 135 - 145 mmol/L   Potassium 3.7 3.5 - 5.1 mmol/L   Chloride 108 101 - 111 mmol/L   CO2 21 (L) 22 - 32 mmol/L   Glucose, Bld 107 (H) 65 - 99 mg/dL   BUN 9 6 - 20 mg/dL   Creatinine, Ser 0.85 0.44 - 1.00 mg/dL   Calcium 9.7 8.9 - 10.3 mg/dL   Total Protein 6.9 6.5 - 8.1 g/dL   Albumin 4.0 3.5 - 5.0 g/dL   AST 61 (H) 15 - 41 U/L   ALT 45 14 - 54 U/L   Alkaline Phosphatase 57 38 - 126 U/L   Total Bilirubin 1.1 0.3 - 1.2 mg/dL   GFR calc non Af Amer >60 >60 mL/min   GFR calc Af Amer >60 >60 mL/min    Comment: (NOTE) The eGFR has been calculated using the CKD EPI equation. This calculation has not been validated in all clinical situations. eGFR's persistently <60 mL/min signify possible Chronic Kidney Disease.    Anion gap 11 5 - 15  CBC     Status: Abnormal   Collection Time: 12/20/15  3:11 PM  Result Value Ref Range   WBC 6.2 4.0 - 10.5 K/uL   RBC 4.17 3.87 - 5.11 MIL/uL   Hemoglobin 13.0 12.0 - 15.0 g/dL   HCT 37.4 36.0 - 46.0 %   MCV 89.7 78.0 - 100.0 fL   MCH 31.2 26.0 - 34.0 pg   MCHC 34.8 30.0 - 36.0 g/dL   RDW 12.6 11.5 - 15.5 %   Platelets 132 (L) 150 - 400 K/uL  I-stat troponin, ED     Status: None   Collection Time: 12/20/15  5:10 PM  Result Value Ref Range   Troponin i, poc 0.00 0.00 - 0.08 ng/mL   Comment 3            Comment: Due to the release kinetics of cTnI, a negative result within the first hours of the onset of symptoms does not rule out myocardial infarction with certainty. If myocardial infarction is still suspected, repeat the test at appropriate intervals.   Urinalysis, Routine w  reflex microscopic     Status: Abnormal   Collection Time: 12/20/15  6:33 PM  Result Value Ref Range   Color, Urine YELLOW YELLOW   APPearance CLEAR CLEAR   Specific Gravity, Urine >1.046 (H) 1.005 - 1.030   pH 6.0 5.0 - 8.0   Glucose, UA NEGATIVE NEGATIVE mg/dL   Hgb urine dipstick NEGATIVE NEGATIVE  Bilirubin Urine NEGATIVE NEGATIVE   Ketones, ur 40 (A) NEGATIVE mg/dL   Protein, ur NEGATIVE NEGATIVE mg/dL   Nitrite NEGATIVE NEGATIVE   Leukocytes, UA NEGATIVE NEGATIVE    Comment: MICROSCOPIC NOT DONE ON URINES WITH NEGATIVE PROTEIN, BLOOD, LEUKOCYTES, NITRITE, OR GLUCOSE <1000 mg/dL.  I-stat troponin, ED     Status: None   Collection Time: 12/20/15  7:55 PM  Result Value Ref Range   Troponin i, poc 0.00 0.00 - 0.08 ng/mL   Comment 3            Comment: Due to the release kinetics of cTnI, a negative result within the first hours of the onset of symptoms does not rule out myocardial infarction with certainty. If myocardial infarction is still suspected, repeat the test at appropriate intervals.   Troponin I-serum (0, 3, 6 hours)     Status: None   Collection Time: 12/20/15 10:45 PM  Result Value Ref Range   Troponin I <0.03 <0.03 ng/mL  Troponin I-serum (0, 3, 6 hours)     Status: None   Collection Time: 12/21/15  2:01 AM  Result Value Ref Range   Troponin I <0.03 <0.03 ng/mL  Troponin I-serum (0, 3, 6 hours)     Status: None   Collection Time: 12/21/15  4:49 AM  Result Value Ref Range   Troponin I <0.03 <0.03 ng/mL  Lipase, blood     Status: Abnormal   Collection Time: 12/21/15  4:49 AM  Result Value Ref Range   Lipase 312 (H) 11 - 51 U/L    Ct Abdomen Pelvis W Contrast  Result Date: 12/20/2015 CLINICAL DATA:  Chronic right upper quadrant abdominal pain, increased today. Nausea. Status post ERCP today. EXAM: CT ABDOMEN AND PELVIS WITH CONTRAST TECHNIQUE: Multidetector CT imaging of the abdomen and pelvis was performed using the standard protocol following bolus  administration of intravenous contrast. CONTRAST:  ISOVUE-300 IOPAMIDOL (ISOVUE-300) INJECTION 61% COMPARISON:  MRI abdomen dated 11/05/2015 FINDINGS: Lower chest: Lung bases clear. Hepatobiliary: Liver is within normal limits. Mild periportal edema. Vicarious excretion of contrast in the gallbladder. Pancreas: Suspected pancreatic divisum with mild prominence of the dorsal pancreatic duct, measuring 6 mm (series 2/ image 32). No associated pancreatic mass or atrophy. Spleen: Within normal limits. Adrenals/Urinary Tract: Adrenal glands are within normal limits. 4 mm right upper pole renal cyst (series 2/ image 32). Left kidney is within normal limits. No hydronephrosis. Bladder is within normal limits. Stomach/Bowel: Stomach is within normal limits. No evidence of bowel obstruction. Normal appendix (series 2/ image 56). Vascular/Lymphatic: Atherosclerotic calcifications of the abdominal aorta and branch vessels. No evidence of abdominal aortic aneurysm. No suspicious abdominopelvic lymphadenopathy. Reproductive: Uterus is within normal limits. Left ovary is within normal limits.  No right adnexal mass. Other: Trace pelvic ascites (series 2/image 70). Musculoskeletal: Very mild degenerative changes of the lower thoracic spine. IMPRESSION: Suspected pancreatic divisum with stable mild prominence of the dorsal pancreatic duct. Vicarious excretion of contrast in the gallbladder. Mild periportal edema. Electronically Signed   By: Charline Bills M.D.   On: 12/20/2015 17:56   Dg Abdomen Acute W/chest  Result Date: 12/20/2015 CLINICAL DATA:  Right upper quadrant pain after endoscopy today. EXAM: DG ABDOMEN ACUTE W/ 1V CHEST COMPARISON:  None. FINDINGS: There is no evidence of dilated bowel loops or free intraperitoneal air. No radiopaque calculi or other significant radiographic abnormality is seen. Contrast is seen within both renal collecting systems and bladder without evidence of obstruction. Opacification  of the  gallbladder is also seen presumably from same day endoscopic procedure. Heart size and mediastinal contours are within normal limits. Both lungs are clear. IMPRESSION: Negative abdominal radiographs.  No acute cardiopulmonary disease. Electronically Signed   By: Ashley Royalty M.D.   On: 12/20/2015 18:35               Blood pressure (!) 105/54, pulse (!) 51, temperature 98.4 F (36.9 C), temperature source Oral, resp. rate 16, height '5\' 2"'$  (1.575 m), weight 67.6 kg (149 lb 0.5 oz), SpO2 98 %.  Physical exam:   General-- pleasant white female no acute distress ENT-- nonicteric Neck-- supple with no lymphadenopathy Heart-- regular rate and rhythm that murmurs are gallops Lungs-- clear Abdomen-- a few scattered bowel sounds with mild epigastric tenderness. Psych-- alert and oriented somewhat groggy from pain medications.   Assessment: 1. Post ERCP pancreatitis 2. 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 She is due to go back to [facility] in about a week for a repeat procedure. Hopefully we will be able to discharge her in a few days on clear liquids with some pancreatic enzymes and oral pain medications. We will follow her with you.   EDWARDS JR,JAMES L 12/21/2015, 10:13 AM   This note was created using voice recognition software and minor errors may Have occurred unintentionally. Pager: [phone] If no answer or after hours call [phone]

## 2015-12-21 NOTE — Progress Notes (Signed)
Felicia Obrien BJY:[id number] DOB: 1955/12/07 DOA: 12/20/2015 PCP: No PCP Per Patient  Brief narrative: 60 y/o ? ? Erythema nodosum Hypertension Hyperlipidemia 11 2012 patient had 2012  midepigastric pain status post referral to [facility] gastroenterology-recent MRI with persistent elevation of lipase prompted ERCP performed 12/21/2015 There was an attempt at cannulation of minor pancreatic duct there were no stones seen in the main or common bile duct and it was felt that she could have a reattempt at the same 4 minus pectoral arrhythmias and outpatient She started having nausea vomiting abdominal pain and was admitted with pancreatitis and 10/26    Past medical history-As per Problem list Chart reviewed as below-   Consultants:  none  Procedures:  nonoe  Antibiotics:  none   Subjective   Well, no issues tol diet clear liquids No cp No sob    Objective    Interim History:   Telemetry:    Objective: Vitals:   12/20/15 2000 12/20/15 2100 12/20/15 2228 12/21/15 0620  BP: 106/63 (!) 121/50 138/63 (!) 102/46  Pulse: (!) 49 (!) 48 (!) 48 (!) 54  Resp: 21 16 16 16   Temp:   98.9 F (37.2 C) 99.1 F (37.3 C)  TempSrc:   Oral Oral  SpO2: 100% 99% 99% 98%  Weight:   67.6 kg (149 lb 0.5 oz)   Height:   5\' 2"  (1.575 m)     Intake/Output Summary (Last 24 hours) at 12/21/15 0744 Last data filed at 12/21/15 0622  Gross per 24 hour  Intake          1318.33 ml  Output              400 ml  Net           918.33 ml    Exam:  General: eomi ncat Cardiovascular: s1 s 2no m/r/g Respiratory: clear no added sound Abdomen: soft, slight epig tenderness Skin no le edema Neuro intact  Data Reviewed: Basic Metabolic Panel:  Recent Labs Lab 12/20/15 1511  NA 140  K 3.7  CL 108  CO2 21*  GLUCOSE 107*  BUN 9  CREATININE 0.85  CALCIUM 9.7   Liver Function Tests:  Recent Labs Lab 12/20/15 1511  AST 61*  ALT 45  ALKPHOS 57  BILITOT 1.1  PROT 6.9    ALBUMIN 4.0    Recent Labs Lab 12/20/15 1511 12/21/15 0449  LIPASE 373* 312*   No results for input(s): AMMONIA in the last 168 hours. CBC:  Recent Labs Lab 12/20/15 1511  WBC 6.2  HGB 13.0  HCT 37.4  MCV 89.7  PLT 132*   Cardiac Enzymes:  Recent Labs Lab 12/20/15 2245 12/21/15 0201 12/21/15 0449  TROPONINI <0.03 <0.03 <0.03   BNP: Invalid input(s): POCBNP CBG: No results for input(s): GLUCAP in the last 168 hours.  No results found for this or any previous visit (from the past 240 hour(s)).   Studies:              All Imaging reviewed and is as per above notation   Scheduled Meds: . aspirin EC  81 mg Oral Daily  . atorvastatin  10 mg Oral Daily  . chlorhexidine  15 mL Mouth Rinse BID  . mouth rinse  15 mL Mouth Rinse q12n4p   Continuous Infusions: . sodium chloride 100 mL/hr at 12/20/15 2249     Assessment/Plan:  Post-ERCP pancreatitis-advance to Clear liquid diet.  If able to manage this, will advance to full liquid  and then potential reg diet tomorrow 10/27 Htn-Atenolol held as slight hypotension.  Also some bradycardia.  Might revisit as OP HLD-cont atorvastatin   expect can probably d/c in 24-48 hr dependant on diet toleracne and pain  Pleas KochJai Samtani, MD  [facility] Pager [phone] 12/21/2015, 7:44 AM    LOS: 0 days

## 2015-12-22 DIAGNOSIS — K858 Other acute pancreatitis without necrosis or infection: Secondary | ICD-10-CM

## 2015-12-22 LAB — COMPREHENSIVE METABOLIC PANEL
ALT: 88 U/L — ABNORMAL HIGH (ref 14–54)
AST: 59 U/L — ABNORMAL HIGH (ref 15–41)
Albumin: 3.2 g/dL — ABNORMAL LOW (ref 3.5–5.0)
Alkaline Phosphatase: 55 U/L (ref 38–126)
Anion gap: 10 (ref 5–15)
BUN: 8 mg/dL (ref 6–20)
CO2: 19 mmol/L — ABNORMAL LOW (ref 22–32)
Calcium: 8.8 mg/dL — ABNORMAL LOW (ref 8.9–10.3)
Chloride: 111 mmol/L (ref 101–111)
Creatinine, Ser: 0.78 mg/dL (ref 0.44–1.00)
GFR calc Af Amer: >60 mL/min (ref >60)
GFR calc non Af Amer: >60 mL/min (ref >60)
Glucose, Bld: 63 mg/dL — ABNORMAL LOW (ref 65–99)
Potassium: 4.1 mmol/L (ref 3.5–5.1)
Sodium: 140 mmol/L (ref 135–145)
Total Bilirubin: 1 mg/dL (ref 0.3–1.2)
Total Protein: 5.8 g/dL — ABNORMAL LOW (ref 6.5–8.1)

## 2015-12-22 LAB — LIPASE, BLOOD: Lipase: 691 U/L — ABNORMAL HIGH (ref 11–51)

## 2015-12-22 MED ORDER — PANCRELIPASE (LIP-PROT-AMYL) 36000-114000 UNITS PO CPEP
36000.0000 [IU] | ORAL_CAPSULE | Freq: Three times a day (TID) | ORAL | Status: DC
  Administered 2015-12-22 – 2015-12-23 (×5): 36000 [IU] via ORAL
  Filled 2015-12-22 (×5): qty 1

## 2015-12-22 MED ORDER — ATENOLOL 25 MG PO TABS
25.0000 mg | ORAL_TABLET | Freq: Every day | ORAL | Status: DC
  Administered 2015-12-22 – 2015-12-24 (×3): 25 mg via ORAL
  Filled 2015-12-22 (×3): qty 1

## 2015-12-22 NOTE — Progress Notes (Signed)
Felicia Obrien UJW:[id number] DOB: 07/29/1955 DOA: 12/20/2015 PCP: No PCP Per Patient  Brief narrative: 60 y/o ? ? Erythema nodosum Hypertension Hyperlipidemia 11 2012 patient had 2012  midepigastric pain status post referral to [facility] gastroenterology-recent MRI with persistent elevation of lipase prompted ERCP performed 12/21/2015 There was an attempt at cannulation of minor pancreatic duct there were no stones seen in the main or common bile duct and it was felt that she could have a reattempt at the same 4 minus pectoral arrhythmias and outpatient She started having nausea vomiting abdominal pain and was admitted with pancreatitis and 10/26    Past medical history-As per Problem list Chart reviewed as below-   Consultants:  none  Procedures:  nonoe  Antibiotics:  none   Subjective   Some abd discomfort early am Needed ant-nausea meds No stool nor gas since yeseterday No cp    Objective    Interim History:   Telemetry:    Objective: Vitals:   12/21/15 2042 12/21/15 2046 12/22/15 0440 12/22/15 0942  BP:  (!) 121/52 (!) 112/42 (!) 145/71  Pulse:  68 74 81  Resp:  19 19 18   Temp:  98.8 F (37.1 C) 98.4 F (36.9 C) 98.3 F (36.8 C)  TempSrc: Oral Oral  Oral  SpO2:  99% 95% 99%  Weight:  73 kg (160 lb 15 oz)    Height:        Intake/Output Summary (Last 24 hours) at 12/22/15 1124 Last data filed at 12/22/15 0200  Gross per 24 hour  Intake              240 ml  Output             1000 ml  Net             -760 ml    Exam:  General: eomi ncat Cardiovascular: s1 s 2no m/r/g Respiratory: clear no added sound Abdomen: soft, slight epig tenderness Skin no le edema Neuro intact  Data Reviewed: Basic Metabolic Panel:  Recent Labs Lab 12/20/15 1511 12/22/15 0512  NA 140 140  K 3.7 4.1  CL 108 111  CO2 21* 19*  GLUCOSE 107* 63*  BUN 9 8  CREATININE 0.85 0.78  CALCIUM 9.7 8.8*   Liver Function Tests:  Recent Labs Lab  12/20/15 1511 12/22/15 0512  AST 61* 59*  ALT 45 88*  ALKPHOS 57 55  BILITOT 1.1 1.0  PROT 6.9 5.8*  ALBUMIN 4.0 3.2*    Recent Labs Lab 12/20/15 1511 12/21/15 0449 12/22/15 0512  LIPASE 373* 312* 691*   No results for input(s): AMMONIA in the last 168 hours. CBC:  Recent Labs Lab 12/20/15 1511  WBC 6.2  HGB 13.0  HCT 37.4  MCV 89.7  PLT 132*   Cardiac Enzymes:  Recent Labs Lab 12/20/15 2245 12/21/15 0201 12/21/15 0449  TROPONINI <0.03 <0.03 <0.03   BNP: Invalid input(s): POCBNP CBG: No results for input(s): GLUCAP in the last 168 hours.  No results found for this or any previous visit (from the past 240 hour(s)).   Studies:              All Imaging reviewed and is as per above notation   Scheduled Meds: . atenolol  25 mg Oral Daily  . chlorhexidine  15 mL Mouth Rinse BID  . lipase/protease/amylase  36,000 Units Oral TID AC  . mouth rinse  15 mL Mouth Rinse q12n4p   Continuous Infusions: . sodium chloride 50  mL/hr at 12/22/15 1118     Assessment/Plan:  Post-ERCP pancreatitis-advance to Clear liquid diet. Lipase 300-600.  GI addd CReon.  Monitor abd discomoft and get AXR if no stool/gas in am.  Cut back IV saliuneto 50cc/h.  Rpt labs am Htn-Atenolol 50  to 25 daily as hypotension.  Also some bradycardia.   HLD-hold atorvastatin   expect can probably d/c in 24-48 hr dependant /diet toleracne and pain  Pleas KochJai Samtani, MD  [facility] Pager [phone] 12/22/2015, 11:24 AM    LOS: 1 day

## 2015-12-22 NOTE — Progress Notes (Signed)
[facility] PROGRESS NOTE Subjective patient still having significant nausea but not much pain. No pain medicine and 24 hours.  Objective: Vital signs in last 24 hours: Temp:  [98.4 F (36.9 C)-98.8 F (37.1 C)] 98.4 F (36.9 C) (10/27 0440) Pulse Rate:  [61-74] 74 (10/27 0440) Resp:  [16-19] 19 (10/27 0440) BP: (112-121)/(42-54) 112/42 (10/27 0440) SpO2:  [95 %-99 %] 95 % (10/27 0440) Weight:  [73 kg (160 lb 15 oz)] 73 kg (160 lb 15 oz) (10/26 2046) Last BM Date: 12/19/15  Intake/Output from previous day: 10/26 0701 - 10/27 0700 In: 240 [P.O.:240] Out: 1000 [Urine:1000] Intake/Output this shift: No intake/output data recorded.  PE: General-- alert no significant distress  Abdomen-- moderate epigastric tenderness  Lab Results:  Recent Labs  12/20/15 1511  WBC 6.2  HGB 13.0  HCT 37.4  PLT 132*   BMET  Recent Labs  12/20/15 1511 12/22/15 0512  NA 140 140  K 3.7 4.1  CL 108 111  CO2 21* 19*  CREATININE 0.85 0.78   LFT  Recent Labs  12/20/15 1511 12/22/15 0512  PROT 6.9 5.8*  AST 61* 59*  ALT 45 88*  ALKPHOS 57 55  BILITOT 1.1 1.0   PT/INR No results for input(s): LABPROT, INR in the last 72 hours. PANCREAS  Recent Labs  12/20/15 1511 12/21/15 0449 12/22/15 0512  LIPASE 373* 312* 691*         Studies/Results: Ct Abdomen Pelvis W Contrast  Result Date: 12/20/2015 CLINICAL DATA:  Chronic right upper quadrant abdominal pain, increased today. Nausea. Status post ERCP today. EXAM: CT ABDOMEN AND PELVIS WITH CONTRAST TECHNIQUE: Multidetector CT imaging of the abdomen and pelvis was performed using the standard protocol following bolus administration of intravenous contrast. CONTRAST:  ISOVUE-300 IOPAMIDOL (ISOVUE-300) INJECTION 61% COMPARISON:  MRI abdomen dated 11/05/2015 FINDINGS: Lower chest: Lung bases clear. Hepatobiliary: Liver is within normal limits. Mild periportal edema. Vicarious excretion of contrast in the  gallbladder. Pancreas: Suspected pancreatic divisum with mild prominence of the dorsal pancreatic duct, measuring 6 mm (series 2/ image 32). No associated pancreatic mass or atrophy. Spleen: Within normal limits. Adrenals/Urinary Tract: Adrenal glands are within normal limits. 4 mm right upper pole renal cyst (series 2/ image 32). Left kidney is within normal limits. No hydronephrosis. Bladder is within normal limits. Stomach/Bowel: Stomach is within normal limits. No evidence of bowel obstruction. Normal appendix (series 2/ image 56). Vascular/Lymphatic: Atherosclerotic calcifications of the abdominal aorta and branch vessels. No evidence of abdominal aortic aneurysm. No suspicious abdominopelvic lymphadenopathy. Reproductive: Uterus is within normal limits. Left ovary is within normal limits.  No right adnexal mass. Other: Trace pelvic ascites (series 2/image 70). Musculoskeletal: Very mild degenerative changes of the lower thoracic spine. IMPRESSION: Suspected pancreatic divisum with stable mild prominence of the dorsal pancreatic duct. Vicarious excretion of contrast in the gallbladder. Mild periportal edema. Electronically Signed   By: Charline Bills M.D.   On: 12/20/2015 17:56   Dg Abdomen Acute W/chest  Result Date: 12/20/2015 CLINICAL DATA:  Right upper quadrant pain after endoscopy today. EXAM: DG ABDOMEN ACUTE W/ 1V CHEST COMPARISON:  None. FINDINGS: There is no evidence of dilated bowel loops or free intraperitoneal air. No radiopaque calculi or other significant radiographic abnormality is seen. Contrast is seen within both renal collecting systems and bladder without evidence of obstruction. Opacification of the gallbladder is also seen presumably from same day endoscopic procedure. Heart size and mediastinal contours are within normal limits. Both lungs are clear. IMPRESSION:  Negative abdominal radiographs.  No acute cardiopulmonary disease. Electronically Signed   By: Tollie Ethavid  Kwon M.D.   On:  12/20/2015 18:35    Medications: I have reviewed the patient's current medications.  Assessment/Plan: 1. Post ERCP pancreatitis. 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 She is due to have another procedure in about 10 more days. Concerning the her lipase is increased but her pain seems to have decreased. She is tolerating clear liquids. We will check lipase in the morning and I will go ahead and add pancreatic enzyme before meals to see if this will help. Dr. Dulce Sellarutlaw will check over the weekend.   EDWARDS JR,JAMES L 12/22/2015, 9:20 AM  This note was created using voice recognition software. Minor errors may Have occurred unintentionally.  Pager: [phone] If no answer or after hours call [phone]

## 2015-12-23 LAB — LIPASE, BLOOD
Lipase: 2359 U/L — ABNORMAL HIGH (ref 11–51)
Lipase: 243 U/L — ABNORMAL HIGH (ref 11–51)

## 2015-12-23 LAB — COMPREHENSIVE METABOLIC PANEL
ALT: 67 U/L — ABNORMAL HIGH (ref 14–54)
AST: 42 U/L — ABNORMAL HIGH (ref 15–41)
Albumin: 3.5 g/dL (ref 3.5–5.0)
Alkaline Phosphatase: 55 U/L (ref 38–126)
Anion gap: 8 (ref 5–15)
BUN: <5 mg/dL — ABNORMAL LOW (ref 6–20)
CO2: 24 mmol/L (ref 22–32)
Calcium: 9.3 mg/dL (ref 8.9–10.3)
Chloride: 109 mmol/L (ref 101–111)
Creatinine, Ser: 0.81 mg/dL (ref 0.44–1.00)
GFR calc Af Amer: >60 mL/min (ref >60)
GFR calc non Af Amer: >60 mL/min (ref >60)
Glucose, Bld: 112 mg/dL — ABNORMAL HIGH (ref 65–99)
Potassium: 3.7 mmol/L (ref 3.5–5.1)
Sodium: 141 mmol/L (ref 135–145)
Total Bilirubin: 1.1 mg/dL (ref 0.3–1.2)
Total Protein: 6.2 g/dL — ABNORMAL LOW (ref 6.5–8.1)

## 2015-12-23 NOTE — Progress Notes (Signed)
Donn PieriniBarbara Egley AOZ:[id number] DOB: 1955/03/21 DOA: 12/20/2015 PCP: No PCP Per Patient  Brief narrative: 60 y/o ? ? Erythema nodosum Hypertension Hyperlipidemia 11 2012 patient had 2012  midepigastric pain status [facility]-recent MRI with persistent elevation of lipase prompted ERCP performed 12/21/2015 There was an attempt at cannulation of minor pancreatic duct there were no stones seen in the main or common bile duct and it was felt that she could have a reattempt at the same 4 minus pectoral arrhythmias and outpatient She started having nausea vomiting abdominal pain and was admitted with pancreatitis and 10/26  Her lipase has trended up despite supportive measures such as fluids and bowel rest but she has not had any further pain   Past medical history-As per Problem list Chart reviewed as below-   Consultants:  none  Procedures:  nonoe  Antibiotics:  none   Subjective   No pain No nausea Tolerating clear liquids very well without issue Some flatus No cp   Objective    Interim History:   Telemetry:    Objective: Vitals:   12/22/15 0942 12/22/15 1700 12/22/15 2200 12/23/15 0618  BP: (!) 145/71 121/61 124/81 (!) 111/54  Pulse: 81 72 70 (!) 58  Resp: 18 18 16 16   Temp: 98.3 F (36.8 C) 97.8 F (36.6 C) 98.7 F (37.1 C) 98.2 F (36.8 C)  TempSrc: Oral Oral Oral Oral  SpO2: 99% 100% 98% 99%  Weight:      Height:        Intake/Output Summary (Last 24 hours) at 12/23/15 0844 Last data filed at 12/23/15 0618  Gross per 24 hour  Intake          4144.17 ml  Output             3650 ml  Net           494.17 ml    Exam:  General: eomi ncat Cardiovascular: s1 s 2no m/r/g Respiratory: clear no added sound Abdomen: soft, slight epig tenderness, no rebound no gaurding, BS are present Skin no le edema Neuro intact  Data Reviewed: Basic Metabolic Panel:  Recent Labs Lab 12/20/15 1511 12/22/15 0512  NA 140 140   K 3.7 4.1  CL 108 111  CO2 21* 19*  GLUCOSE 107* 63*  BUN 9 8  CREATININE 0.85 0.78  CALCIUM 9.7 8.8*   Liver Function Tests:  Recent Labs Lab 12/20/15 1511 12/22/15 0512  AST 61* 59*  ALT 45 88*  ALKPHOS 57 55  BILITOT 1.1 1.0  PROT 6.9 5.8*  ALBUMIN 4.0 3.2*    Recent Labs Lab 12/20/15 1511 12/21/15 0449 12/22/15 0512 12/23/15 0525  LIPASE 373* 312* 691* 2,359*   No results for input(s): AMMONIA in the last 168 hours. CBC:  Recent Labs Lab 12/20/15 1511  WBC 6.2  HGB 13.0  HCT 37.4  MCV 89.7  PLT 132*   Cardiac Enzymes:  Recent Labs Lab 12/20/15 2245 12/21/15 0201 12/21/15 0449  TROPONINI <0.03 <0.03 <0.03   BNP: Invalid input(s): POCBNP CBG: No results for input(s): GLUCAP in the last 168 hours.  No results found for this or any previous visit (from the past 240 hour(s)).   Studies:              All Imaging reviewed and is as per above notation   Scheduled Meds: . atenolol  25 mg Oral Daily  . chlorhexidine  15 mL Mouth Rinse BID  . lipase/protease/amylase  36,000  Units Oral TID AC  . mouth rinse  15 mL Mouth Rinse q12n4p   Continuous Infusions: . sodium chloride 50 mL/hr at 12/22/15 1118     Assessment/Plan:  Post-ERCP pancreatitis-advance to Clear liquid diet. Lipase 300-600--->2000 range.  GI addd CReon.  We will; monitor her lipase again this pm 10/28.  CMET has been ordered for this morning and is pending-if there is persistent elevation of lipase, may benefit from possible [facility]-will discuss later today with GI I did initially cut back her IVF to 50 cc/h but in view of rising lipase have increased rate to 100 cc/h Htn-Atenolol 50  to 25 daily as hypotension.  Also some bradycardia.   HLD-hold atorvastatin   dispo unclear  Pleas KochJai Samtani, MD  [facility] Pager [phone] 12/23/2015, 8:44 AM    LOS: 2 days

## 2015-12-23 NOTE — Progress Notes (Signed)
Subjective: Lingering post-prandial epigastric abdominal pain, even with clear liquids.  Objective: Vital signs in last 24 hours: Temp:  [97.8 F (36.6 C)-98.7 F (37.1 C)] 98.2 F (36.8 C) (10/28 1000) Pulse Rate:  [58-72] 62 (10/28 1000) Resp:  [16-18] 16 (10/28 1000) BP: (111-129)/(54-81) 129/61 (10/28 1000) SpO2:  [96 %-100 %] 96 % (10/28 1000) Weight change:  Last BM Date: 12/19/15  PE: GEN:  NAD ABD:  Mild epigastric tenderness, hypoactive bowel sounds, no peritonitis  Lab Results: CBC    Component Value Date/Time   WBC 6.2 12/20/2015 1511   RBC 4.17 12/20/2015 1511   HGB 13.0 12/20/2015 1511   HCT 37.4 12/20/2015 1511   PLT 132 (L) 12/20/2015 1511   MCV 89.7 12/20/2015 1511   MCH 31.2 12/20/2015 1511   MCHC 34.8 12/20/2015 1511   RDW 12.6 12/20/2015 1511   LYMPHSABS 1.4 02/02/2015 1730   MONOABS 0.3 02/02/2015 1730   EOSABS 0.0 02/02/2015 1730   BASOSABS 0.0 02/02/2015 1730   CMP     Component Value Date/Time   NA 141 12/23/2015 0853   K 3.7 12/23/2015 0853   CL 109 12/23/2015 0853   CO2 24 12/23/2015 0853   GLUCOSE 112 (H) 12/23/2015 0853   BUN <5 (L) 12/23/2015 0853   CREATININE 0.81 12/23/2015 0853   CALCIUM 9.3 12/23/2015 0853   PROT 6.2 (L) 12/23/2015 0853   ALBUMIN 3.5 12/23/2015 0853   AST 42 (H) 12/23/2015 0853   ALT 67 (H) 12/23/2015 0853   ALKPHOS 55 12/23/2015 0853   BILITOT 1.1 12/23/2015 0853   GFRNONAA >60 12/23/2015 0853   GFRAA >60 12/23/2015 0853   Assessment:  1.  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 2.  Post-ERCP pancreatitis, from what sounds like [facility].  Slowly improving.  Plan:  1.  Clear liquids today still; do not advance.  Perhaps we'll try to advance to full liquids tomorrow, depending on how she does over the next 12 hours or so. 2.  Don't see clear benefit from pancreatic enzymes; will stop them. 3.  Told patient that we  shouldn't get too concerned about the actual lipase value.  Clinical status is much more indicative of overall situation, once the diagnosis of pancreatitis has been made (typically once lipase is over 3 x ULN). 4.  Intravenous fluids and judicious analgesics. 5.  OOBTC, ambulate halls as tolerated. 6.  [facility] will follow.   Freddy JakschOUTLAW,WILLIAM M 12/23/2015, 4:20 PM   Pager [phone] If no answer or after 5 PM call [phone]

## 2015-12-24 LAB — COMPREHENSIVE METABOLIC PANEL
ALT: 50 U/L (ref 14–54)
AST: 28 U/L (ref 15–41)
Albumin: 3.3 g/dL — ABNORMAL LOW (ref 3.5–5.0)
Alkaline Phosphatase: 49 U/L (ref 38–126)
Anion gap: 5 (ref 5–15)
BUN: <5 mg/dL — ABNORMAL LOW (ref 6–20)
CO2: 25 mmol/L (ref 22–32)
Calcium: 9 mg/dL (ref 8.9–10.3)
Chloride: 108 mmol/L (ref 101–111)
Creatinine, Ser: 0.65 mg/dL (ref 0.44–1.00)
GFR calc Af Amer: >60 mL/min (ref >60)
GFR calc non Af Amer: >60 mL/min (ref >60)
Glucose, Bld: 141 mg/dL — ABNORMAL HIGH (ref 65–99)
Potassium: 3.6 mmol/L (ref 3.5–5.1)
Sodium: 138 mmol/L (ref 135–145)
Total Bilirubin: 0.6 mg/dL (ref 0.3–1.2)
Total Protein: 5.9 g/dL — ABNORMAL LOW (ref 6.5–8.1)

## 2015-12-24 LAB — LIPASE, BLOOD: Lipase: 267 U/L — ABNORMAL HIGH (ref 11–51)

## 2015-12-24 MED ORDER — ONDANSETRON 4 MG PO TBDP: 4.0000 mg | ORAL_TABLET | Freq: Three times a day (TID) | ORAL | Status: DC | PRN

## 2015-12-24 NOTE — Progress Notes (Signed)
Subjective: Abdominal pain improving. Tolerating clear liquids. No analgesic requirements for couple days.  Objective: Vital signs in last 24 hours: Temp:  [98.2 F (36.8 C)-98.8 F (37.1 C)] 98.2 F (36.8 C) (10/29 1009) Pulse Rate:  [60-73] 60 (10/29 1009) Resp:  [16-17] 17 (10/29 1009) BP: (125-159)/(54-67) 159/61 (10/29 1009) SpO2:  [96 %-100 %] 100 % (10/29 1009) Weight:  [66.7 kg (147 lb)] 66.7 kg (147 lb) (10/28 2134) Weight change:  Last BM Date: 12/19/15  PE: GEN:  NAD ABD:  Soft, mild epigastric tenderness, no peritonitis, hypoactive but present bowel sounds  Lab Results: CBC    Component Value Date/Time   WBC 6.2 12/20/2015 1511   RBC 4.17 12/20/2015 1511   HGB 13.0 12/20/2015 1511   HCT 37.4 12/20/2015 1511   PLT 132 (L) 12/20/2015 1511   MCV 89.7 12/20/2015 1511   MCH 31.2 12/20/2015 1511   MCHC 34.8 12/20/2015 1511   RDW 12.6 12/20/2015 1511   LYMPHSABS 1.4 02/02/2015 1730   MONOABS 0.3 02/02/2015 1730   EOSABS 0.0 02/02/2015 1730   BASOSABS 0.0 02/02/2015 1730   CMP     Component Value Date/Time   NA 138 12/24/2015 0941   K 3.6 12/24/2015 0941   CL 108 12/24/2015 0941   CO2 25 12/24/2015 0941   GLUCOSE 141 (H) 12/24/2015 0941   BUN <5 (L) 12/24/2015 0941   CREATININE 0.65 12/24/2015 0941   CALCIUM 9.0 12/24/2015 0941   PROT 5.9 (L) 12/24/2015 0941   ALBUMIN 3.3 (L) 12/24/2015 0941   AST 28 12/24/2015 0941   ALT 50 12/24/2015 0941   ALKPHOS 49 12/24/2015 0941   BILITOT 0.6 12/24/2015 0941   GFRNONAA >60 12/24/2015 0941   GFRAA >60 12/24/2015 0941   Assessment:  1.  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 2.  Post-ERCP pancreatitis, from what sounds [facility].  Improving.  Plan:  1.  Advance diet to no lactose full liquids; if tolerates full liquids for lunch, will consider low fat no lactose soft mechanical diet for dinner. 2.  Decrease IV  fluids to 50 cc/hr.  If tolerates soft mechanical diet, would consider stopping IV fluids entirely. 3. OOBTC, ambulate halls as tolerated. 4.  Ondansetron ODT 4 mg SL every 8 hours prn. 5.  If tolerates advance diet to soft mechanical and pain controlled adequately on oral regimen, would consider discharge home tomorrow from GI perspective with [facility] 6.  [facility] will follow.   Felicia Obrien,WILLIAM M 12/24/2015, 11:51 AM   Pager [phone] If no answer or after 5 PM call [phone]

## 2015-12-24 NOTE — Progress Notes (Signed)
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 Hypertension Hyperlipidemia 11 2012 patient had 2012  midepigastric pain status post [facility]-recent MRI with persistent elevation of lipase prompted ERCP performed 12/21/2015 There was an attempt at cannulation of minor pancreatic duct there were no stones seen in the main or common bile duct and it was felt that she could have a reattempt at the same 4 minus pectoral arrhythmias and outpatient She started having nausea vomiting abdominal pain and was admitted with pancreatitis and 10/26  Her lipase has trended up despite supportive measures such as fluids and bowel rest but she has not had any further pain   Past medical history-As per Problem list Chart reviewed as below-   Consultants:  none  Procedures:  nonoe  Antibiotics:  none   Subjective   No pain/ Mild n  No vomit +stool No fever    Objective    Interim History:   Telemetry:    Objective: Vitals:   12/23/15 1648 12/23/15 2134 12/24/15 0604 12/24/15 1009  BP: (!) 141/67 (!) 125/55 (!) 132/54 (!) 159/61  Pulse: 73 67 60 60  Resp: 16 16 16 17   Temp: 98.8 F (37.1 C) 98.3 F (36.8 C) 98.6 F (37 C) 98.2 F (36.8 C)  TempSrc: Oral Oral Oral Oral  SpO2: 96% 97% 98% 100%  Weight:  66.7 kg (147 lb)    Height:        Intake/Output Summary (Last 24 hours) at 12/24/15 1236 Last data filed at 12/24/15 1035  Gross per 24 hour  Intake          2808.33 ml  Output             2251 ml  Net           557.33 ml    Exam:  General: eomi ncat Cardiovascular: s1 s 2no m/r/g Respiratory: clear no added sound Abdomen: soft, no epig tenderness, no rebound no gaurding, BS are present Skin no le edema Neuro intact  Data Reviewed: Basic Metabolic Panel:  Recent Labs Lab 12/20/15 1511 12/22/15 0512 12/23/15 0853 12/24/15 0941  NA 140  140 141 138  K 3.7 4.1 3.7 3.6  CL 108 111 109 108  CO2 21* 19* 24 25  GLUCOSE 107* 63* 112* 141*  BUN 9 8 <5* <5*  CREATININE 0.85 0.78 0.81 0.65  CALCIUM 9.7 8.8* 9.3 9.0   Liver Function Tests:  Recent Labs Lab 12/20/15 1511 12/22/15 0512 12/23/15 0853 12/24/15 0941  AST 61* 59* 42* 28  ALT 45 88* 67* 50  ALKPHOS 57 55 55 49  BILITOT 1.1 1.0 1.1 0.6  PROT 6.9 5.8* 6.2* 5.9*  ALBUMIN 4.0 3.2* 3.5 3.3*    Recent Labs Lab 12/21/15 0449 12/22/15 0512 12/23/15 0525 12/23/15 1356 12/24/15 0941  LIPASE 312* 691* 2,359* 243* 267*   No results for input(s): AMMONIA in the last 168 hours. CBC:  Recent Labs Lab 12/20/15 1511  WBC 6.2  HGB 13.0  HCT 37.4  MCV 89.7  PLT 132*   Cardiac Enzymes:  Recent Labs Lab 12/20/15 2245 12/21/15 0201 12/21/15 0449  TROPONINI <0.03 <0.03 <0.03   BNP: Invalid input(s): POCBNP CBG: No results for input(s): GLUCAP in the last 168 hours.  No results found for this or any previous visit (from the past 240 hour(s)).   Studies:  All Imaging reviewed and is as per above notation   Scheduled Meds: . atenolol  25 mg Oral Daily  . chlorhexidine  15 mL Mouth Rinse BID  . lipase/protease/amylase  36,000 Units Oral TID AC  . mouth rinse  15 mL Mouth Rinse q12n4p   Continuous Infusions: . sodium chloride 50 mL/hr (12/24/15 1234)     Assessment/Plan:  Post-ERCP pancreatitis-advance to Clear liquid diet. Lipase 300-600--->2000 range [probably inaccurate reporting-subsequently has dropped to 200 range].  Expectant management and GI graduated diet today-- back to GI at [facility] for intervention cut back her IVF to 50 cc/h and d/c in am Htn-Atenolol 50  to 25 daily as hypotension.  Also some bradycardia.   HLD-hold atorvastatin   dispo likely home am D/w husband Appreciate gi input  Pleas Koch, MD  [facility] Pager [phone] 12/24/2015, 12:36 PM    LOS: 3 days

## 2015-12-25 LAB — COMPREHENSIVE METABOLIC PANEL
ALT: 41 U/L (ref 14–54)
AST: 25 U/L (ref 15–41)
Albumin: 3.3 g/dL — ABNORMAL LOW (ref 3.5–5.0)
Alkaline Phosphatase: 34 U/L — ABNORMAL LOW (ref 38–126)
Anion gap: 6 (ref 5–15)
BUN: <5 mg/dL — ABNORMAL LOW (ref 6–20)
CO2: 29 mmol/L (ref 22–32)
Calcium: 9.5 mg/dL (ref 8.9–10.3)
Chloride: 107 mmol/L (ref 101–111)
Creatinine, Ser: 0.71 mg/dL (ref 0.44–1.00)
GFR calc Af Amer: >60 mL/min (ref >60)
GFR calc non Af Amer: >60 mL/min (ref >60)
Glucose, Bld: 92 mg/dL (ref 65–99)
Potassium: 4 mmol/L (ref 3.5–5.1)
Sodium: 142 mmol/L (ref 135–145)
Total Bilirubin: 0.7 mg/dL (ref 0.3–1.2)
Total Protein: 5.9 g/dL — ABNORMAL LOW (ref 6.5–8.1)

## 2015-12-25 LAB — LIPASE, BLOOD: Lipase: 263 U/L — ABNORMAL HIGH (ref 11–51)

## 2015-12-25 MED ORDER — ONDANSETRON 4 MG PO TBDP: 4.0000 mg | ORAL_TABLET | Freq: Three times a day (TID) | ORAL | 0 refills | Status: DC | PRN

## 2015-12-25 MED ORDER — ATENOLOL 25 MG PO TABS: 50.0000 mg | ORAL_TABLET | Freq: Every day | ORAL | Status: DC

## 2015-12-25 NOTE — Progress Notes (Signed)
Donn PieriniBarbara Woolever to be D/C'd home per MD order.  Discussed prescriptions and follow up appointments with the patient. Prescriptions given to patient, medication list explained in detail. Pt verbalized understanding.    Medication List    STOP taking these medications   doxycycline 100 MG capsule Commonly known as:  VIBRAMYCIN     TAKE these medications   ALPRAZolam 0.25 MG tablet Commonly known as:  XANAX Take 0.25 mg by mouth at bedtime as needed for anxiety.   aspirin 81 MG tablet Take 81 mg by mouth daily.   atenolol 25 MG tablet Commonly known as:  TENORMIN Take 2 tablets (50 mg total) by mouth daily. What changed:  medication strength   atorvastatin 10 MG tablet Commonly known as:  LIPITOR Take 10 mg by mouth daily.   MULTI-VITAMINS Tabs Take 1 tablet by mouth daily.   ondansetron 4 MG disintegrating tablet Commonly known as:  ZOFRAN-ODT Take 1 tablet (4 mg total) by mouth every 8 (eight) hours as needed for nausea or vomiting.   PROBIOTIC DAILY Caps Take 1 capsule by mouth daily.   RA KRILL OIL Caps Take 1 capsule by mouth daily.   VITA-C PO Take 1 tablet by mouth daily.   VITAMIN B COMPLEX PO Take 1 tablet by mouth daily.   Vitamin D 2000 units Caps Take 2,000 Units by mouth daily.       Vitals:   12/25/15 0641 12/25/15 1009  BP: (!) 110/46 125/70  Pulse: 63 67  Resp: 16 16  Temp: 99 F (37.2 C) 98.3 F (36.8 C)    Skin clean, dry and intact without evidence of skin break down, no evidence of skin tears noted. IV catheter discontinued intact. Site without signs and symptoms of complications. Dressing and pressure applied. Pt denies pain at this time. No complaints noted.  An After Visit Summary was printed and given to the patient. Patient escorted via ambulation, and D/C home via private auto.  Britt BologneseAnisha Mabe BSN, RN

## 2015-12-25 NOTE — Progress Notes (Signed)
Subjective: Abdominal pain much improved.  Objective: Vital signs in last 24 hours: Temp:  [98.2 F (36.8 C)-99 F (37.2 C)] 99 F (37.2 C) (10/30 0641) Pulse Rate:  [58-63] 63 (10/30 0641) Resp:  [16-18] 16 (10/30 0641) BP: (110-159)/(46-64) 110/46 (10/30 0641) SpO2:  [97 %-100 %] 97 % (10/30 0641) Weight change:  Last BM Date: 12/23/15  PE: GEN:  NAD  Lab Results: CBC    Component Value Date/Time   WBC 6.2 12/20/2015 1511   RBC 4.17 12/20/2015 1511   HGB 13.0 12/20/2015 1511   HCT 37.4 12/20/2015 1511   PLT 132 (L) 12/20/2015 1511   MCV 89.7 12/20/2015 1511   MCH 31.2 12/20/2015 1511   MCHC 34.8 12/20/2015 1511   RDW 12.6 12/20/2015 1511   LYMPHSABS 1.4 02/02/2015 1730   MONOABS 0.3 02/02/2015 1730   EOSABS 0.0 02/02/2015 1730   BASOSABS 0.0 02/02/2015 1730   CMP     Component Value Date/Time   NA 142 12/25/2015 0628   K 4.0 12/25/2015 0628   CL 107 12/25/2015 0628   CO2 29 12/25/2015 0628   GLUCOSE 92 12/25/2015 0628   BUN <5 (L) 12/25/2015 0628   CREATININE 0.71 12/25/2015 0628   CALCIUM 9.5 12/25/2015 0628   PROT 5.9 (L) 12/25/2015 0628   ALBUMIN 3.3 (L) 12/25/2015 0628   AST 25 12/25/2015 0628   ALT 41 12/25/2015 0628   ALKPHOS 34 (L) 12/25/2015 0628   BILITOT 0.7 12/25/2015 0628   GFRNONAA >60 12/25/2015 0628   GFRAA >60 12/25/2015 19140628   Assessment:  1. 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 2. Post-ERCP pancreatitis, from what sounds like free hand minor needle knife papill[facility]. Improving.  Plan:  1.  Patient has minimal pain, no analgesic needs, and is tolerating low fat soft diet. 2.  OK to discharge home today from GI perspective.  She can use me as her local GI point person, but will need to get primary direction from [facility]Burbridge [facility], who did her recent [facility]. 3.  [facility] will sign-off; please call with questions; thank you for the consultation.   Freddy JakschOUTLAW,WILLIAM  M 12/25/2015, 8:54 AM   Pager [phone] If no answer or after 5 PM call [phone]

## 2015-12-25 NOTE — Discharge Summary (Signed)
Physician Discharge Summary  Donn PieriniBarbara Carby ZOX:[id number] DOB: [birth year] DOA: 12/20/2015  PCP: No PCP Per Patient  Admit date: 12/20/2015 Discharge date: 12/25/2015  Time spent: 30 minutes  Recommendations for Outpatient Follow-up:  1. [facility] 2. Get follow up CMET and lipase 1 week 3. Zofran ODT Rx given on d/c 4. 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 Hypertension Hyperlipidemia 11 2012 patient had 2012  midepigastric pain status post referral [facility]-recent MRI with persistent elevation of lipase prompted ERCP performed 12/21/2015 There was an attempt at cannulation of minor pancreatic duct there were no stones seen in the main or common bile duct and it was felt that she could have a reattempt at the same 4 minus pectoral arrhythmias and outpatient She started having nausea vomiting abdominal pain and was admitted with pancreatitis and 10/26  Her lipase has trended up despite supportive measures such as fluids and bowel rest but she has not had any further pain  Hospital Course:  Post-ERCP pancreatitis-Clear liquid diet-->sof diet was tol well. Lipase stable in 200 range on d/c--[facility] an follow up sometime next week Htn-Atenolol 50  to 25 daily as hypotension.  Also some bradycardia.   HLD-hold atorvastatin   [facility] Consultations:   Discharge Exam: Vitals:   12/24/15 2057 12/25/15 0641  BP: 131/64 (!) 110/46  Pulse: 60 63  Resp: 16 16  Temp: 98.6 F (37 C) 99 F (37.2 C)   A little quesy this am otherwsie is well No n/v General: eomi  ncat Cardiovascular: s1 s 2no m/r/g Respiratory: clear no added sound  Discharge Instructions   Discharge Instructions    Diet - low sodium heart healthy    Complete by:  As directed    Discharge instructions    Complete by:  As directed    [facility] We will Rx for you a limited amount of zofran to help with nausea Continue all of your other meds.  The only change is Atenolol which w ehave dropped hte dose of   Increase activity slowly    Complete by:  As directed      Current Discharge Medication List    START taking these medications   Details  ondansetron (ZOFRAN-ODT) 4 MG disintegrating tablet Take 1 tablet (4 mg total) by mouth every 8 (eight) hours as needed for nausea or vomiting. Qty: 20 tablet, Refills: 0      CONTINUE these medications which have CHANGED   Details  atenolol (TENORMIN) 25 MG tablet Take 2 tablets (50 mg total) by mouth daily.      CONTINUE these medications which have NOT CHANGED   Details  ALPRAZolam (XANAX) 0.25 MG tablet Take 0.25 mg by mouth at bedtime as needed for anxiety.    Ascorbic Acid (VITA-C PO) Take 1 tablet by mouth daily.    aspirin 81 MG tablet Take 81 mg by mouth daily.    atorvastatin (LIPITOR) 10 MG tablet Take 10 mg by mouth daily.    B Complex Vitamins (VITAMIN  B COMPLEX PO) Take 1 tablet by mouth daily.    Cholecalciferol (VITAMIN D) 2000 UNITS CAPS Take 2,000 Units by mouth daily.     Multiple Vitamin (MULTI-VITAMINS) TABS Take 1 tablet by mouth daily.    Probiotic Product (PROBIOTIC DAILY) CAPS Take 1 capsule by mouth daily.    RA KRILL OIL CAPS Take 1 capsule by mouth daily.       STOP taking these medications     doxycycline (VIBRAMYCIN) 100 MG capsule        Allergies  Allergen Reactions  . Atorvastatin Other (See Comments)    Muscle cramps with dose over 10 mg       The results of significant diagnostics from this hospitalization (including imaging, microbiology,  ancillary and laboratory) are listed below for reference.    Significant Diagnostic Studies: Ct Abdomen Pelvis W Contrast  Result Date: 12/20/2015 CLINICAL DATA:  Chronic right upper quadrant abdominal pain, increased today. Nausea. Status post ERCP today. EXAM: CT ABDOMEN AND PELVIS WITH CONTRAST TECHNIQUE: Multidetector CT imaging of the abdomen and pelvis was performed using the standard protocol following bolus administration of intravenous contrast. CONTRAST:  ISOVUE-300 IOPAMIDOL (ISOVUE-300) INJECTION 61% COMPARISON:  MRI abdomen dated 11/05/2015 FINDINGS: Lower chest: Lung bases clear. Hepatobiliary: Liver is within normal limits. Mild periportal edema. Vicarious excretion of contrast in the gallbladder. Pancreas: Suspected pancreatic divisum with mild prominence of the dorsal pancreatic duct, measuring 6 mm (series 2/ image 32). No associated pancreatic mass or atrophy. Spleen: Within normal limits. Adrenals/Urinary Tract: Adrenal glands are within normal limits. 4 mm right upper pole renal cyst (series 2/ image 32). Left kidney is within normal limits. No hydronephrosis. Bladder is within normal limits. Stomach/Bowel: Stomach is within normal limits. No evidence of bowel obstruction. Normal appendix (series 2/ image 56). Vascular/Lymphatic: Atherosclerotic calcifications of the abdominal aorta and branch vessels. No evidence of abdominal aortic aneurysm. No suspicious abdominopelvic lymphadenopathy. Reproductive: Uterus is within normal limits. Left ovary is within normal limits.  No right adnexal mass. Other: Trace pelvic ascites (series 2/image 70). Musculoskeletal: Very mild degenerative changes of the lower thoracic spine. IMPRESSION: Suspected pancreatic divisum with stable mild prominence of the dorsal pancreatic duct. Vicarious excretion of contrast in the gallbladder. Mild periportal edema. Electronically Signed   By: Charline Bills M.D.   On: 12/20/2015 17:56   Dg Abdomen Acute  W/chest  Result Date: 12/20/2015 CLINICAL DATA:  Right upper quadrant pain after endoscopy today. EXAM: DG ABDOMEN ACUTE W/ 1V CHEST COMPARISON:  None. FINDINGS: There is no evidence of dilated bowel loops or free intraperitoneal air. No radiopaque calculi or other significant radiographic abnormality is seen. Contrast is seen within both renal collecting systems and bladder without evidence of obstruction. Opacification of the gallbladder is also seen presumably from same day endoscopic procedure. Heart size and mediastinal contours are within normal limits. Both lungs are clear. IMPRESSION: Negative abdominal radiographs.  No acute cardiopulmonary disease. Electronically Signed   By: Tollie Eth M.D.   On: 12/20/2015 18:35    Microbiology: No results found for this or any previous visit (from the past 240 hour(s)).   Labs: Basic Metabolic Panel:  Recent Labs Lab 12/20/15 1511 12/22/15 0512 12/23/15 0853 12/24/15 0941 12/25/15 0628  NA 140 140 141 138 142  K 3.7 4.1 3.7 3.6 4.0  CL 108 111 109 108 107  CO2 21* 19* 24 25 29   GLUCOSE 107* 63* 112* 141* 92  BUN 9 8 <5* <5* <5*  CREATININE  0.85 0.78 0.81 0.65 0.71  CALCIUM 9.7 8.8* 9.3 9.0 9.5   Liver Function Tests:  Recent Labs Lab 12/20/15 1511 12/22/15 0512 12/23/15 0853 12/24/15 0941 12/25/15 0628  AST 61* 59* 42* 28 25  ALT 45 88* 67* 50 41  ALKPHOS 57 55 55 49 34*  BILITOT 1.1 1.0 1.1 0.6 0.7  PROT 6.9 5.8* 6.2* 5.9* 5.9*  ALBUMIN 4.0 3.2* 3.5 3.3* 3.3*    Recent Labs Lab 12/21/15 0449 12/22/15 0512 12/23/15 0525 12/23/15 1356 12/24/15 0941  LIPASE 312* 691* 2,359* 243* 267*   No results for input(s): AMMONIA in the last 168 hours. CBC:  Recent Labs Lab 12/20/15 1511  WBC 6.2  HGB 13.0  HCT 37.4  MCV 89.7  PLT 132*   Cardiac Enzymes:  Recent Labs Lab 12/20/15 2245 12/21/15 0201 12/21/15 0449  TROPONINI <0.03 <0.03 <0.03   BNP: BNP (last 3 results) No results for input(s): BNP in the  last 8760 hours.  ProBNP (last 3 results) No results for input(s): PROBNP in the last 8760 hours.  CBG: No results for input(s): GLUCAP in the last 168 hours.     SignedRhetta Mura:  SAMTANI, JAI-GURMUKH MD   [facility] 12/25/2015, 8:32 AM

## 2016-01-07 ENCOUNTER — Inpatient Hospital Stay (HOSPITAL_COMMUNITY)
Admission: EM | Admit: 2016-01-07 | Discharge: 2016-01-16 | DRG: 439 | Disposition: A | Discharge status: Home / Self Care | Payer: Managed Care, Other (non HMO) | Attending: Internal Medicine | Admitting: Internal Medicine

## 2016-01-07 ENCOUNTER — Encounter (HOSPITAL_COMMUNITY): Payer: Self-pay | Admitting: *Deleted

## 2016-01-07 DIAGNOSIS — Z8249 Family history of ischemic heart disease and other diseases of the circulatory system: Secondary | ICD-10-CM | POA: Diagnosis not present

## 2016-01-07 DIAGNOSIS — E860 Dehydration: Secondary | ICD-10-CM | POA: Diagnosis present

## 2016-01-07 DIAGNOSIS — E8729 Other acidosis: Secondary | ICD-10-CM | POA: Diagnosis present

## 2016-01-07 DIAGNOSIS — R1011 Right upper quadrant pain: Secondary | ICD-10-CM | POA: Diagnosis not present

## 2016-01-07 DIAGNOSIS — K9189 Other postprocedural complications and disorders of digestive system: Secondary | ICD-10-CM | POA: Diagnosis present

## 2016-01-07 DIAGNOSIS — Z7982 Long term (current) use of aspirin: Secondary | ICD-10-CM

## 2016-01-07 DIAGNOSIS — I10 Essential (primary) hypertension: Secondary | ICD-10-CM | POA: Diagnosis present

## 2016-01-07 DIAGNOSIS — Q453 Other congenital malformations of pancreas and pancreatic duct: Secondary | ICD-10-CM

## 2016-01-07 DIAGNOSIS — K861 Other chronic pancreatitis: Secondary | ICD-10-CM | POA: Diagnosis present

## 2016-01-07 DIAGNOSIS — Z9889 Other specified postprocedural states: Secondary | ICD-10-CM

## 2016-01-07 DIAGNOSIS — E785 Hyperlipidemia, unspecified: Secondary | ICD-10-CM | POA: Diagnosis present

## 2016-01-07 DIAGNOSIS — K8500 Idiopathic acute pancreatitis without necrosis or infection: Secondary | ICD-10-CM | POA: Diagnosis not present

## 2016-01-07 DIAGNOSIS — K863 Pseudocyst of pancreas: Secondary | ICD-10-CM | POA: Diagnosis present

## 2016-01-07 DIAGNOSIS — E872 Acidosis: Secondary | ICD-10-CM | POA: Diagnosis present

## 2016-01-07 DIAGNOSIS — K859 Acute pancreatitis without necrosis or infection, unspecified: Principal | ICD-10-CM | POA: Diagnosis present

## 2016-01-07 DIAGNOSIS — Z79899 Other long term (current) drug therapy: Secondary | ICD-10-CM | POA: Diagnosis not present

## 2016-01-07 DIAGNOSIS — K853 Drug induced acute pancreatitis without necrosis or infection: Secondary | ICD-10-CM | POA: Diagnosis not present

## 2016-01-07 DIAGNOSIS — D649 Anemia, unspecified: Secondary | ICD-10-CM | POA: Diagnosis not present

## 2016-01-07 DIAGNOSIS — K858 Other acute pancreatitis without necrosis or infection: Secondary | ICD-10-CM | POA: Diagnosis not present

## 2016-01-07 DIAGNOSIS — K219 Gastro-esophageal reflux disease without esophagitis: Secondary | ICD-10-CM | POA: Diagnosis present

## 2016-01-07 DIAGNOSIS — I491 Atrial premature depolarization: Secondary | ICD-10-CM | POA: Diagnosis present

## 2016-01-07 DIAGNOSIS — E876 Hypokalemia: Secondary | ICD-10-CM | POA: Diagnosis present

## 2016-01-07 DIAGNOSIS — D696 Thrombocytopenia, unspecified: Secondary | ICD-10-CM | POA: Diagnosis present

## 2016-01-07 DIAGNOSIS — E782 Mixed hyperlipidemia: Secondary | ICD-10-CM | POA: Diagnosis not present

## 2016-01-07 LAB — COMPREHENSIVE METABOLIC PANEL
ALT: 20 U/L (ref 14–54)
AST: 28 U/L (ref 15–41)
Albumin: 4.1 g/dL (ref 3.5–5.0)
Alkaline Phosphatase: 60 U/L (ref 38–126)
Anion gap: 17 — ABNORMAL HIGH (ref 5–15)
BUN: 12 mg/dL (ref 6–20)
CO2: 20 mmol/L — ABNORMAL LOW (ref 22–32)
Calcium: 10 mg/dL (ref 8.9–10.3)
Chloride: 102 mmol/L (ref 101–111)
Creatinine, Ser: 0.94 mg/dL (ref 0.44–1.00)
GFR calc Af Amer: >60 mL/min (ref >60)
GFR calc non Af Amer: >60 mL/min (ref >60)
Glucose, Bld: 101 mg/dL — ABNORMAL HIGH (ref 65–99)
Potassium: 3.7 mmol/L (ref 3.5–5.1)
Sodium: 139 mmol/L (ref 135–145)
Total Bilirubin: 1.5 mg/dL — ABNORMAL HIGH (ref 0.3–1.2)
Total Protein: 7.4 g/dL (ref 6.5–8.1)

## 2016-01-07 LAB — CBC WITH DIFFERENTIAL/PLATELET
Basophils Absolute: 0 10*3/uL (ref 0.0–0.1)
Basophils Relative: 0 %
Eosinophils Absolute: 0 10*3/uL (ref 0.0–0.7)
Eosinophils Relative: 0 %
HCT: 39.1 % (ref 36.0–46.0)
Hemoglobin: 13.4 g/dL (ref 12.0–15.0)
Lymphocytes Relative: 12 %
Lymphs Abs: 1 10*3/uL (ref 0.7–4.0)
MCH: 30.8 pg (ref 26.0–34.0)
MCHC: 34.3 g/dL (ref 30.0–36.0)
MCV: 89.9 fL (ref 78.0–100.0)
Monocytes Absolute: 0.5 10*3/uL (ref 0.1–1.0)
Monocytes Relative: 7 %
Neutro Abs: 6.6 10*3/uL (ref 1.7–7.7)
Neutrophils Relative %: 81 %
Platelets: 192 10*3/uL (ref 150–400)
RBC: 4.35 MIL/uL (ref 3.87–5.11)
RDW: 12.6 % (ref 11.5–15.5)
WBC: 8.1 10*3/uL (ref 4.0–10.5)

## 2016-01-07 LAB — LIPASE, BLOOD: Lipase: 1079 U/L — ABNORMAL HIGH (ref 11–51)

## 2016-01-07 LAB — MAGNESIUM: Magnesium: 1.7 mg/dL (ref 1.7–2.4)

## 2016-01-07 LAB — PHOSPHORUS: Phosphorus: 3.9 mg/dL (ref 2.5–4.6)

## 2016-01-07 MED ORDER — PROMETHAZINE HCL 25 MG/ML IJ SOLN
25.0000 mg | Freq: Four times a day (QID) | INTRAMUSCULAR | Status: DC | PRN
  Administered 2016-01-10 – 2016-01-12 (×8): 25 mg via INTRAVENOUS
  Filled 2016-01-07 (×9): qty 1

## 2016-01-07 MED ORDER — SODIUM CHLORIDE 0.9 % IV SOLN
1000.0000 mL | INTRAVENOUS | Status: DC
  Administered 2016-01-07 (×2): 1000 mL via INTRAVENOUS

## 2016-01-07 MED ORDER — DIPHENHYDRAMINE HCL 50 MG/ML IJ SOLN: 12.5000 mg | Freq: Four times a day (QID) | INTRAMUSCULAR | Status: DC | PRN

## 2016-01-07 MED ORDER — MORPHINE SULFATE (PF) 4 MG/ML IV SOLN
4.0000 mg | Freq: Once | INTRAVENOUS | Status: AC
  Administered 2016-01-07: 4 mg via INTRAVENOUS
  Filled 2016-01-07: qty 1

## 2016-01-07 MED ORDER — SODIUM CHLORIDE 0.9 % IV SOLN
1000.0000 mL | INTRAVENOUS | Status: DC
  Administered 2016-01-07 – 2016-01-08 (×3): 1000 mL via INTRAVENOUS

## 2016-01-07 MED ORDER — ACETAMINOPHEN 650 MG RE SUPP: 650.0000 mg | Freq: Four times a day (QID) | RECTAL | Status: DC | PRN

## 2016-01-07 MED ORDER — ACETAMINOPHEN 325 MG PO TABS
650.0000 mg | ORAL_TABLET | Freq: Four times a day (QID) | ORAL | Status: DC | PRN
  Filled 2016-01-07: qty 2

## 2016-01-07 MED ORDER — SODIUM CHLORIDE 0.9% FLUSH: 9.0000 mL | INTRAVENOUS | Status: DC | PRN

## 2016-01-07 MED ORDER — DIPHENHYDRAMINE HCL 12.5 MG/5ML PO ELIX: 12.5000 mg | ORAL_SOLUTION | Freq: Four times a day (QID) | ORAL | Status: DC | PRN

## 2016-01-07 MED ORDER — METOCLOPRAMIDE HCL 5 MG/ML IJ SOLN
10.0000 mg | Freq: Once | INTRAMUSCULAR | Status: AC
  Administered 2016-01-07: 10 mg via INTRAVENOUS
  Filled 2016-01-07: qty 2

## 2016-01-07 MED ORDER — ONDANSETRON HCL 4 MG/2ML IJ SOLN: 4.0000 mg | Freq: Four times a day (QID) | INTRAMUSCULAR | Status: DC | PRN

## 2016-01-07 MED ORDER — SODIUM CHLORIDE 0.9 % IV SOLN
1000.0000 mL | Freq: Once | INTRAVENOUS | Status: AC
  Administered 2016-01-07: 1000 mL via INTRAVENOUS

## 2016-01-07 MED ORDER — HYDRALAZINE HCL 20 MG/ML IJ SOLN: 10.0000 mg | Freq: Three times a day (TID) | INTRAMUSCULAR | Status: DC | PRN

## 2016-01-07 MED ORDER — DIPHENHYDRAMINE HCL 50 MG/ML IJ SOLN
25.0000 mg | Freq: Once | INTRAMUSCULAR | Status: AC
  Administered 2016-01-07: 25 mg via INTRAVENOUS
  Filled 2016-01-07: qty 1

## 2016-01-07 MED ORDER — ONDANSETRON HCL 4 MG/2ML IJ SOLN
4.0000 mg | Freq: Once | INTRAMUSCULAR | Status: AC
  Administered 2016-01-07: 4 mg via INTRAVENOUS
  Filled 2016-01-07: qty 2

## 2016-01-07 MED ORDER — ONDANSETRON HCL 4 MG/2ML IJ SOLN
4.0000 mg | Freq: Four times a day (QID) | INTRAMUSCULAR | Status: DC
  Administered 2016-01-07 – 2016-01-15 (×31): 4 mg via INTRAVENOUS
  Filled 2016-01-07 (×31): qty 2

## 2016-01-07 MED ORDER — MORPHINE SULFATE 2 MG/ML IV SOLN
INTRAVENOUS | Status: DC
  Administered 2016-01-07: 3 mg via INTRAVENOUS
  Administered 2016-01-07: 11:00:00 via INTRAVENOUS
  Administered 2016-01-07 – 2016-01-08 (×3): 0 mg via INTRAVENOUS
  Filled 2016-01-07: qty 25

## 2016-01-07 MED ORDER — ALPRAZOLAM 0.25 MG PO TABS
0.2500 mg | ORAL_TABLET | Freq: Every evening | ORAL | Status: DC | PRN
Start: 2016-01-07 — End: 2016-01-09
  Filled 2016-01-07: qty 1

## 2016-01-07 MED ORDER — ZOLPIDEM TARTRATE 5 MG PO TABS: 5.0000 mg | ORAL_TABLET | Freq: Every evening | ORAL | Status: DC | PRN

## 2016-01-07 MED ORDER — KETOROLAC TROMETHAMINE 15 MG/ML IJ SOLN: 15.0000 mg | Freq: Four times a day (QID) | INTRAMUSCULAR | Status: DC | PRN

## 2016-01-07 MED ORDER — NALOXONE HCL 0.4 MG/ML IJ SOLN: 0.4000 mg | INTRAMUSCULAR | Status: DC | PRN

## 2016-01-07 NOTE — H&P (Signed)
History and Physical    Felicia PieriniBarbara Obrien ZOX:[id number] DOB: February 17, 1956 DOA: 01/07/2016   PCP: Mickie HillierLITTLE,KEVIN LORNE, MD   Patient coming from/Resides with: Private residence/lives with husband  Admission status: Inpatient/medical floor -medically necessary to stay a minimum 2 midnights to rule out impending and/or unexpected changes in physiologic status that may differ from initial evaluation performed in the ER and/or at time of admission. Presents with acute, recurrent post-ERCP pancreatitis with significant abdominal pain. Have discussed with gastroenterology who recommends aggressive IV fluid hydration with supportive care/symptom management of pain and nausea. If condition worsens will need stat CT abdomen and pelvis and possible transfer [facility] for possible consideration for surgical consultation.  Chief Complaint: Abdominal pain with nausea and vomiting  HPI: Felicia PieriniBarbara Obrien is a 60 y.o. 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 She was recently hospitalized in October after undergoing [facility] and experiencing post pancreatitis ERCP. CT scan done at that time revealed slight swelling of the pancreas without abscess or pseudocyst. Her initial lipase was 312 and peaked at 2359. Patient was discharged [facility]. She has subsequently undergone [facility]. Unfortunately, as has occurred with previous attempts, they were unable to perform sphincterotomy. Since the procedure she has only ingested either water or ginger ale. Yesterday evening she attempted a Popsicle and developed significant abdominal pain followed by nausea and vomiting. She has not had any diarrhea. She reports she no longer takes pancreatic enzymes since her primary [facility] stated they would not be helpful given the etiology of her pancreatitis. She continues to have significant  abdominal pain. Of note she has upper lip edema that she reports was related to unintentional trauma during endoscopy on 11/10.  ED Course:  Vital Signs: BP 144/70   Pulse 87   Resp 22   Ht 5\' 2"  (1.575 m)   Wt 64.4 kg (142 lb)   SpO2 100%   BMI 25.97 kg/m  Lab data: Sodium 139, potassium 3.7, CO2 20, BUN 12, creatinine 0.94, glucose 101, anion gap 17, alkaline phosphatase 60, albumin 4.1, lipase 1079, AST 28, ALT 20, total protein 7.4, total bilirubin 1.5. White count 8100 with neutrophils 81% absolute neutrophils 6.6%, hemoglobin 13.4, platelets 192,000. Medications and treatments: Normal saline bolus 3 L, morphine 4 mg IV 3 doses, Zofran 4 mg IV 1, Reglan 10 mg IV 1, Benadryl 25 mg IV 1  Review of Systems:  In addition to the HPI above,  No Fever-chills, myalgias or other constitutional symptoms No Headache, changes with Vision or hearing, new weakness, tingling, numbness in any extremity, dizziness, dysarthria or word finding difficulty, gait disturbance or imbalance, tremors or seizure activity No problems swallowing food or Liquids, indigestion/reflux, choking or coughing while eating No Chest pain, Cough or Shortness of Breath, palpitations, orthopnea or DOE No melena,hematochezia, dark tarry stools, constipation No dysuria, malodorous urine, hematuria or flank pain No new skin rashes, lesions, masses or bruises, No new joint pains, aches, swelling or redness No recent unintentional weight gain or loss No polyuria, polydypsia or polyphagia   Past Medical History:  Diagnosis Date  . Abnormal Pap smear of cervix   . GERD (gastroesophageal reflux disease)   . Hyperlipidemia   . Hypertension   . PAC (premature atrial contraction)     Past Surgical History:  Procedure Laterality Date  . CERVICAL CONE BIOPSY      Social History  Social History  . Marital status: Married    Spouse name: N/A  . Number of children: N/A  . Years of education: N/A   Occupational  History  . Not on file.   Social History Main Topics  . Smoking status: Never Smoker  . Smokeless tobacco: Never Used  . Alcohol use No  . Drug use: No  . Sexual activity: Not on file   Other Topics Concern  . Not on file   Social History Narrative  . No narrative on file    Mobility: Without assistive devices Work history: Not obtained   Allergies  Allergen Reactions  . Atorvastatin Other (See Comments)    Muscle cramps with dose over 10 mg     Family History  Problem Relation Age of Onset  . Cancer Mother   . Hyperlipidemia Father   . Hypertension Father   . Hyperlipidemia Sister   . Hyperlipidemia Brother      Prior to Admission medications   Medication Sig Start Date End Date Taking? Authorizing Provider  ALPRAZolam Prudy Feeler) 0.25 MG tablet Take 0.25 mg by mouth at bedtime as needed for anxiety.   Yes Historical Provider, MD  Ascorbic Acid (VITA-C PO) Take 1 tablet by mouth daily.   Yes Historical Provider, MD  aspirin 81 MG tablet Take 81 mg by mouth daily.   Yes Historical Provider, MD  atenolol (TENORMIN) 25 MG tablet Take 2 tablets (50 mg total) by mouth daily. Patient taking differently: Take 25 mg by mouth daily.  12/25/15  Yes Rhetta Mura, MD  atorvastatin (LIPITOR) 10 MG tablet Take 10 mg by mouth daily.   Yes Historical Provider, MD  B Complex Vitamins (VITAMIN B COMPLEX PO) Take 1 tablet by mouth daily.   Yes Historical Provider, MD  Cholecalciferol (VITAMIN D) 2000 UNITS CAPS Take 2,000 Units by mouth daily.    Yes Historical Provider, MD  Multiple Vitamin (MULTI-VITAMINS) TABS Take 1 tablet by mouth daily.   Yes Historical Provider, MD  ondansetron (ZOFRAN-ODT) 4 MG disintegrating tablet Take 1 tablet (4 mg total) by mouth every 8 (eight) hours as needed for nausea or vomiting. 12/25/15  Yes Rhetta Mura, MD  Probiotic Product (PROBIOTIC DAILY) CAPS Take 1 capsule by mouth daily.   Yes Historical Provider, MD  RA KRILL OIL CAPS Take 1  capsule by mouth daily.    Yes Historical Provider, MD    Physical Exam: Vitals:   01/07/16 0612 01/07/16 0615 01/07/16 0800 01/07/16 0815  BP: (!) 100/47 (!) 107/51 135/58 144/70  Pulse: 66 73 89 87  Resp: 22     SpO2: 99% 100% 100% 100%  Weight:      Height:          Constitutional: NAD, calmBut has recently been given pain medications, uncomfortable Eyes: PERRL, lids and conjunctivae normal ENMT: Mucous membranes are dry. Posterior pharynx clear of any exudate or lesions. Normal dentition. Central portion of the upper lip edematous. Neck: normal, supple, no masses, no thyromegaly Respiratory: clear to auscultation bilaterally, no wheezing, no crackles. Normal respiratory effort. No accessory muscle use.  Cardiovascular: Regular rate and rhythm, no murmurs / rubs / gallops. No extremity edema. 2+ pedal pulses. No carotid bruits.  Abdomen: Somewhat diffusely tender with more focal tenderness in epigastrium and left upper quadrant with positive guarding and a degree of rebounding, no masses palpated. No obvious hepatosplenomegaly but exam limited by reports of pain. Bowel sounds positive.  Musculoskeletal: no clubbing / cyanosis. No joint deformity  upper and lower extremities. Good ROM, no contractures. Normal muscle tone.  Skin: no rashes, lesions, ulcers. No induration Neurologic: CN 2-12 grossly intact. Sensation intact, DTR normal. Strength 5/5 x all 4 extremities.  Psychiatric: Normal judgment and insight. Alert and oriented x 3. Normal mood.    Labs on Admission: I have personally reviewed following labs and imaging studies  CBC:  Recent Labs Lab 01/07/16 0300  WBC 8.1  NEUTROABS 6.6  HGB 13.4  HCT 39.1  MCV 89.9  PLT 192   Basic Metabolic Panel:  Recent Labs Lab 01/07/16 0300  NA 139  K 3.7  CL 102  CO2 20*  GLUCOSE 101*  BUN 12  CREATININE 0.94  CALCIUM 10.0   GFR: Estimated Creatinine Clearance: 56.8 mL/min (by C-G formula based on SCr of 0.94  mg/dL). Liver Function Tests:  Recent Labs Lab 01/07/16 0300  AST 28  ALT 20  ALKPHOS 60  BILITOT 1.5*  PROT 7.4  ALBUMIN 4.1    Recent Labs Lab 01/07/16 0300  LIPASE 1,079*   No results for input(s): AMMONIA in the last 168 hours. Coagulation Profile: No results for input(s): INR, PROTIME in the last 168 hours. Cardiac Enzymes: No results for input(s): CKTOTAL, CKMB, CKMBINDEX, TROPONINI in the last 168 hours. BNP (last 3 results) No results for input(s): PROBNP in the last 8760 hours. HbA1C: No results for input(s): HGBA1C in the last 72 hours. CBG: No results for input(s): GLUCAP in the last 168 hours. Lipid Profile: No results for input(s): CHOL, HDL, LDLCALC, TRIG, CHOLHDL, LDLDIRECT in the last 72 hours. Thyroid Function Tests: No results for input(s): TSH, T4TOTAL, FREET4, T3FREE, THYROIDAB in the last 72 hours. Anemia Panel: No results for input(s): VITAMINB12, FOLATE, FERRITIN, TIBC, IRON, RETICCTPCT in the last 72 hours. Urine analysis:    Component Value Date/Time   COLORURINE YELLOW 12/20/2015 1833   APPEARANCEUR CLEAR 12/20/2015 1833   LABSPEC >1.046 (H) 12/20/2015 1833   PHURINE 6.0 12/20/2015 1833   GLUCOSEU NEGATIVE 12/20/2015 1833   HGBUR NEGATIVE 12/20/2015 1833   BILIRUBINUR NEGATIVE 12/20/2015 1833   KETONESUR 40 (A) 12/20/2015 1833   PROTEINUR NEGATIVE 12/20/2015 1833   NITRITE NEGATIVE 12/20/2015 1833   LEUKOCYTESUR NEGATIVE 12/20/2015 1833   Sepsis Labs: @LABRCNTIP (procalcitonin:4,lacticidven:4) )No results found for this or any previous visit (from the past 240 hour(s)).   Radiological Exams on Admission: No results found.   Assessment/Plan Principal Problem:   Recurrent, acute Pancreatitis Status post endoscopic retrograde cholangiopancreatography /known Pancreas divisum -Patient with history of pancreatitis post prior ERCP that has responded to symptom management -NPO, IV fluids -Scheduled Zofran and prn Phenergan to treat  nausea and vomiting -PCA morphine -Lipase in a.m. -Discussed with GI/[facility], who was familiar with this patient, he recommended at this juncture no indication to pursue CT abdomen and pelvis and to focus on symptom management/supportive care-he will see in consultation -CT scan indications would be: Fever, worsened pain, increasing lipase, or leukocytosis -If patient worsens, consideration may be given to transferring patient to [facility] for further evaluation by her primary gastroenterologist and/or surgical team -Does have slight elevation in total bilirubin with other LFTs normal and this is more suggestive of volume depletion-repeat chemistries in a.m. -No hypo-or hyperglycemia noted  Active Problems:   Increased anion gap metabolic acidosis -Likely related to dehydration -Follow electrolytes    Dehydration, moderate -Normal saline IV fluids at 150 mL/h    Hypertension -Home Tenormin on hold and consider resuming on 11/13 if blood pressures remain stable  HLD (hyperlipidemia) -NPO so preadmission statin on hold       DVT prophylaxis: SCDs Code Status: Full Family Communication: Husband at bedside Disposition Plan: Anticipate discharge to home environment once medically stable-if condition worsens consideration will be given to possible transfer to her [facility]ology [facility] Consults called: Gastroenterology/[facility]    Russella DarELLIS,ALLISON L. ANP-BC [facility] Pager [phone]   If 7PM-7AM, please contact night-coverage [email or web address] Password [email or web address]  01/07/2016, 8:45 AM

## 2016-01-07 NOTE — ED Triage Notes (Signed)
[facility]  Increased pain 2 hours ago with n v  No diarrhea  Vomiting in triage  lmp none

## 2016-01-07 NOTE — ED Provider Notes (Signed)
[facility]ER[facility]ENCY DEPT Provider Note   CSN: [id number] Arrival date & time: 01/07/16  0236  By signing my name below, I, Felicia Obrien, attest that this documentation has been prepared under the direction and in the presence of Felicia Boozeavid Glick, MD. Electronically Signed: Morene CrockerKevin Obrien, Scribe. 01/07/16. 3:28 AM.  History   Chief Complaint Chief Complaint  Patient presents with  . Abdominal Pain   The history is provided by the patient. No language interpreter was used.   HPI Comments: Felicia Obrien is a 60 y.o. female with PMHx of GERD, HTN, HLD who presents to the Emergency Department complaining of constant, worsening RUQ abdominal pain that started 12 hours ago. She had a 2nd [facility]PR at [facility] 2 day ago and states she started to feel pain from the pancreatic region this afternoon. She has associated symptoms of nausea and vomiting. She reports taking Advil and zofran 4mg  13 hours ago with no relief. She denies fever, chills, and back pain. She notes her appetite has been limited in the past 2 days. 2 weeks ago, she was admitted to the hospital for 5 days after 1st ERCP.     Past Medical History:  Diagnosis Date  . Abnormal Pap smear of cervix   . GERD (gastroesophageal reflux disease)   . Hyperlipidemia   . Hypertension   . PAC (premature atrial contraction)     Patient Active Problem List   Diagnosis Date Noted  . Pancreatitis 12/20/2015  . Hypertension 12/20/2015  . Chest pain 12/20/2015  . Bradycardia 12/20/2015    Past Surgical History:  Procedure Laterality Date  . CERVICAL CONE BIOPSY      OB History    No data available       Home Medications    Prior to Admission medications   Medication Sig Start Date End Date Taking? Authorizing Provider  ALPRAZolam Prudy Feeler(XANAX) 0.25 MG tablet Take 0.25 mg by mouth at bedtime as needed for anxiety.    Historical Provider, MD  Ascorbic Acid (VITA-C PO) Take 1 tablet by mouth daily.    Historical Provider, MD  aspirin 81 MG  tablet Take 81 mg by mouth daily.    Historical Provider, MD  atenolol (TENORMIN) 25 MG tablet Take 2 tablets (50 mg total) by mouth daily. 12/25/15   Rhetta MuraJai-Gurmukh Samtani, MD  atorvastatin (LIPITOR) 10 MG tablet Take 10 mg by mouth daily.    Historical Provider, MD  B Complex Vitamins (VITAMIN B COMPLEX PO) Take 1 tablet by mouth daily.    Historical Provider, MD  Cholecalciferol (VITAMIN D) 2000 UNITS CAPS Take 2,000 Units by mouth daily.     Historical Provider, MD  Multiple Vitamin (MULTI-VITAMINS) TABS Take 1 tablet by mouth daily.    Historical Provider, MD  ondansetron (ZOFRAN-ODT) 4 MG disintegrating tablet Take 1 tablet (4 mg total) by mouth every 8 (eight) hours as needed for nausea or vomiting. 12/25/15   Rhetta MuraJai-Gurmukh Samtani, MD  Probiotic Product (PROBIOTIC DAILY) CAPS Take 1 capsule by mouth daily.    Historical Provider, MD  RA KRILL OIL CAPS Take 1 capsule by mouth daily.     Historical Provider, MD    Family History Family History  Problem Relation Age of Onset  . Cancer Mother   . Hyperlipidemia Father   . Hypertension Father   . Hyperlipidemia Sister   . Hyperlipidemia Brother     Social History Social History  Substance Use Topics  . Smoking status: Never Smoker  . Smokeless tobacco: Never Used  .  Alcohol use No     Allergies   Atorvastatin   Review of Systems Review of Systems  Constitutional: Negative for chills and fever.  Gastrointestinal: Positive for abdominal pain, nausea and vomiting.  Musculoskeletal: Negative for back pain.  All other systems reviewed and are negative.    Physical Exam Updated Vital Signs BP 134/72   Pulse 105   Resp 18   Wt 142 lb 8 oz (64.6 kg)   SpO2 97%   BMI 26.06 kg/m   Physical Exam  Constitutional: She is oriented to person, place, and time. She appears well-developed and well-nourished.  HENT:  Head: Normocephalic and atraumatic.  Eyes: EOM are normal. Pupils are equal, round, and reactive to light.  Neck:  Normal range of motion. Neck supple. No JVD present.  Cardiovascular: Normal rate, regular rhythm and normal heart sounds.   No murmur heard. Pulmonary/Chest: Effort normal and breath sounds normal. She has no wheezes. She has no rales. She exhibits no tenderness.  Abdominal: She exhibits no distension and no mass. There is tenderness. There is no guarding.  Moderate epigastric tenderness No rebound or gaurding Bowel sounds decreased  Musculoskeletal: Normal range of motion. She exhibits no edema.  Lymphadenopathy:    She has no cervical adenopathy.  Neurological: She is alert and oriented to person, place, and time. No cranial nerve deficit. She exhibits normal muscle tone. Coordination normal.  Skin: Skin is warm and dry. No rash noted.  Psychiatric: She has a normal mood and affect. Her behavior is normal. Judgment and thought content normal.  Nursing note and vitals reviewed.    ED Treatments / Results  DIAGNOSTIC STUDIES: Oxygen Saturation is 97% on RA, normal by my interpretation.    COORDINATION OF CARE: 2:58 AM Discussed treatment plan with pt at bedside and pt agreed to plan.  Labs (all labs ordered are listed, but only abnormal results are displayed) Labs Reviewed  COMPREHENSIVE METABOLIC PANEL - Abnormal; Notable for the following:       Result Value   CO2 20 (*)    Glucose, Bld 101 (*)    Total Bilirubin 1.5 (*)    Anion gap 17 (*)    All other components within normal limits  LIPASE, BLOOD - Abnormal; Notable for the following:    Lipase 1,079 (*)    All other components within normal limits  CBC WITH DIFFERENTIAL/PLATELET    Procedures Procedures (including critical care time)  Medications Ordered in ED Medications  0.9 %  sodium chloride infusion (1,000 mLs Intravenous New Bag/Given 01/07/16 0300)    Followed by  0.9 %  sodium chloride infusion (1,000 mLs Intravenous New Bag/Given 01/07/16 0442)  morphine 4 MG/ML injection 4 mg (not administered)    morphine 4 MG/ML injection 4 mg (4 mg Intravenous Given 01/07/16 0304)  ondansetron (ZOFRAN) injection 4 mg (4 mg Intravenous Given 01/07/16 0304)  morphine 4 MG/ML injection 4 mg (4 mg Intravenous Given 01/07/16 0442)  metoCLOPramide (REGLAN) injection 10 mg (10 mg Intravenous Given 01/07/16 0803)  diphenhydrAMINE (BENADRYL) injection 25 mg (25 mg Intravenous Given 01/07/16 0807)     Initial Impression / Assessment and Plan / ED Course  I have reviewed the triage vital signs and the nursing notes.  Pertinent lab results that were available during my care of the patient were reviewed by me and considered in my medical decision making (see chart for details).  Clinical Course    Abdominal pain following ERCP. Pain is certainly consistent with pancreatitis.  Old records are reviewed, and she had to be admitted to the hospital on October 25 following prior ERCP and was hospitalized for 5 days. Will give IV fluids, morphine, ondansetron and check screening labs including lipase.  Lipase, lactic significant elevated at over 1000. Patient had initial good relief with morphine and ondansetron, but pain recurred. She is given additional dose of morphine with, once again, temporary improvement but then recurrence of pain, this time with nausea. She is given a third dose of morphine and a dose of metoclopramide. At this point, it was felt that she failed attempts at ED pain management and would need to be admitted. Case is discussed with Junious SilkAllison Ellis of [facility] who agrees to admit the patient.  Final Clinical Impressions(s) / ED Diagnoses   Final diagnoses:  Recurrent pancreatitis (HCC)    New Prescriptions New Prescriptions   No medications on file   I personally performed the services described in this documentation, which was scribed in my presence. The recorded information has been reviewed and is accurate.       Felicia Boozeavid Glick, MD 01/07/16 [phone]

## 2016-01-07 NOTE — Consult Note (Signed)
[facility] Reason for consult: Pancreatitis Referring Physician: [facility]. PCP: Dr. Hulan Fess. Primary G.I.: Dr. Teena Irani. Dr. Tillie Rung at [facility].  Felicia Obrien is an 60 y.o. female.  HPI: she is well known to our practice and has seen Dr. Teena Irani for some time. She has had recurrent episodes of pancreatitis dating back to her 48s that have gotten progressively worse. Work up here in [place] revealed pancreas divisum. She appeared to have a scarred dorsal pancreatic duct. She was referred to [facility] and 10/17 ERCP was attempted but the minor papillae could not be cannulated. The intent apparently was to do a sphincterotomy of the minor papilla but this was unsuccessful. The patient came back to [facility] had an episode of pancreatitis requiring admission. This improved after 4 days in the hospital. 2 days ago ERCP was again attempted [facility] again was unsuccessful. Sphincterotomy was not performed. The patient stated in a motel 11/10 was doing okay but started having pain yesterday after beginning clear liquids it is gotten progressively worse. Her lipase is over thousand. She is doing better after pain medications. TB 1.5 other LFTs are okay. WBC 8.1. The plan was for her to go back in several weeks and discuss surgery on her pancreatic ducts by the pancreatic surgeon at [facility].  Past Medical History:  Diagnosis Date  . Abnormal Pap smear of cervix   . GERD (gastroesophageal reflux disease)   . Hyperlipidemia   . Hypertension   . PAC (premature atrial contraction)     Past Surgical History:  Procedure Laterality Date  . CERVICAL CONE BIOPSY      Family History  Problem Relation Age of Onset  . Cancer Mother   . Hyperlipidemia Father   . Hypertension Father   . Hyperlipidemia Sister   . Hyperlipidemia Brother     Social History:  reports that she has never smoked. She has never used smokeless tobacco. She reports that  she does not drink alcohol or use drugs.  Allergies:  Allergies  Allergen Reactions  . Atorvastatin Other (See Comments)    Muscle cramps with dose over 10 mg     Medications; Prior to Admission medications   Medication Sig Start Date End Date Taking? Authorizing Provider  ALPRAZolam Duanne Moron) 0.25 MG tablet Take 0.25 mg by mouth at bedtime as needed for anxiety.   Yes Historical Provider, MD  Ascorbic Acid (VITA-C PO) Take 1 tablet by mouth daily.   Yes Historical Provider, MD  aspirin 81 MG tablet Take 81 mg by mouth daily.   Yes Historical Provider, MD  atenolol (TENORMIN) 25 MG tablet Take 2 tablets (50 mg total) by mouth daily. Patient taking differently: Take 25 mg by mouth daily.  12/25/15  Yes Nita Sells, MD  atorvastatin (LIPITOR) 10 MG tablet Take 10 mg by mouth daily.   Yes Historical Provider, MD  B Complex Vitamins (VITAMIN B COMPLEX PO) Take 1 tablet by mouth daily.   Yes Historical Provider, MD  Cholecalciferol (VITAMIN D) 2000 UNITS CAPS Take 2,000 Units by mouth daily.    Yes Historical Provider, MD  Multiple Vitamin (MULTI-VITAMINS) TABS Take 1 tablet by mouth daily.   Yes Historical Provider, MD  ondansetron (ZOFRAN-ODT) 4 MG disintegrating tablet Take 1 tablet (4 mg total) by mouth every 8 (eight) hours as needed for nausea or vomiting. 12/25/15  Yes Nita Sells, MD  Probiotic Product (PROBIOTIC DAILY) CAPS Take 1 capsule by mouth daily.   Yes Historical Provider, MD  RA KRILL OIL CAPS Take 1 capsule by mouth daily.    Yes Historical Provider, MD   . morphine   Intravenous Q4H  . ondansetron (ZOFRAN) IV  4 mg Intravenous Q6H   PRN Meds diphenhydrAMINE **OR** diphenhydrAMINE, naloxone **AND** sodium chloride flush, promethazine Results for orders placed or performed during the hospital encounter of 01/07/16 (from the past 48 hour(s))  Comprehensive metabolic panel     Status: Abnormal   Collection Time: 01/07/16  3:00 AM  Result Value Ref Range    Sodium 139 135 - 145 mmol/L   Potassium 3.7 3.5 - 5.1 mmol/L   Chloride 102 101 - 111 mmol/L   CO2 20 (L) 22 - 32 mmol/L   Glucose, Bld 101 (H) 65 - 99 mg/dL   BUN 12 6 - 20 mg/dL   Creatinine, Ser 0.94 0.44 - 1.00 mg/dL   Calcium 10.0 8.9 - 10.3 mg/dL   Total Protein 7.4 6.5 - 8.1 g/dL   Albumin 4.1 3.5 - 5.0 g/dL   AST 28 15 - 41 U/L   ALT 20 14 - 54 U/L   Alkaline Phosphatase 60 38 - 126 U/L   Total Bilirubin 1.5 (H) 0.3 - 1.2 mg/dL   GFR calc non Af Amer >60 >60 mL/min   GFR calc Af Amer >60 >60 mL/min    Comment: (NOTE) The eGFR has been calculated using the CKD EPI equation. This calculation has not been validated in all clinical situations. eGFR's persistently <60 mL/min signify possible Chronic Kidney Disease.    Anion gap 17 (H) 5 - 15  Lipase, blood     Status: Abnormal   Collection Time: 01/07/16  3:00 AM  Result Value Ref Range   Lipase 1,079 (H) 11 - 51 U/L    Comment: RESULTS CONFIRMED BY MANUAL DILUTION  CBC with Differential     Status: None   Collection Time: 01/07/16  3:00 AM  Result Value Ref Range   WBC 8.1 4.0 - 10.5 K/uL   RBC 4.35 3.87 - 5.11 MIL/uL   Hemoglobin 13.4 12.0 - 15.0 g/dL   HCT 39.1 36.0 - 46.0 %   MCV 89.9 78.0 - 100.0 fL   MCH 30.8 26.0 - 34.0 pg   MCHC 34.3 30.0 - 36.0 g/dL   RDW 12.6 11.5 - 15.5 %   Platelets 192 150 - 400 K/uL   Neutrophils Relative % 81 %   Neutro Abs 6.6 1.7 - 7.7 K/uL   Lymphocytes Relative 12 %   Lymphs Abs 1.0 0.7 - 4.0 K/uL   Monocytes Relative 7 %   Monocytes Absolute 0.5 0.1 - 1.0 K/uL   Eosinophils Relative 0 %   Eosinophils Absolute 0.0 0.0 - 0.7 K/uL   Basophils Relative 0 %   Basophils Absolute 0.0 0.0 - 0.1 K/uL    No results found.            Blood pressure 118/61, pulse 79, resp. rate 22, height 5' 2" (1.575 m), weight 64.4 kg (142 lb), SpO2 99 %.  Physical exam:   General--Somewhat groggy white female no distress ENT-- nonicteric Neck-- supple Heart-- regular rate rhythm  without murmurs are gallops Lungs-- clear Abdomen-- slightly distended with minimal bowel sounds, tender in the epigastric area Psych-- alert and oriented   Assessment: 1. Acute pancreatitis. Hopefully this will resolve with rest of the pancreas and pain medication. 2. Pancreas divisum with chronic pancreatitis. Endoscopic therapy by experienced ERCP-ist has not resolve the issue and surgery is  being considered  Plan: at this point, would continue supportive therapy with IV fluids and pain medications. Hopefully she will respond rapidly as she did the last time. If not we can discuss transfer to [facility] If she responds it can tolerate nutrition, she will follow-up with Dr.Burbridge in the future to discuss possible surgical therapy  EDWARDS JR,JAMES L 01/07/2016, 9:42 AM   This note was created using voice recognition software and minor errors may Have occurred unintentionally. Pager: [phone] If no answer or after hours call [phone]

## 2016-01-07 NOTE — ED Notes (Signed)
The pt vomited approx liquid in triage

## 2016-01-07 NOTE — ED Notes (Signed)
ED Provider at bedside. 

## 2016-01-08 DIAGNOSIS — Q453 Other congenital malformations of pancreas and pancreatic duct: Secondary | ICD-10-CM

## 2016-01-08 DIAGNOSIS — K8500 Idiopathic acute pancreatitis without necrosis or infection: Secondary | ICD-10-CM

## 2016-01-08 LAB — COMPREHENSIVE METABOLIC PANEL
ALT: 13 U/L — ABNORMAL LOW (ref 14–54)
AST: 17 U/L (ref 15–41)
Albumin: 3 g/dL — ABNORMAL LOW (ref 3.5–5.0)
Alkaline Phosphatase: 45 U/L (ref 38–126)
Anion gap: 11 (ref 5–15)
BUN: <5 mg/dL — ABNORMAL LOW (ref 6–20)
CO2: 19 mmol/L — ABNORMAL LOW (ref 22–32)
Calcium: 8.7 mg/dL — ABNORMAL LOW (ref 8.9–10.3)
Chloride: 108 mmol/L (ref 101–111)
Creatinine, Ser: 0.79 mg/dL (ref 0.44–1.00)
GFR calc Af Amer: >60 mL/min (ref >60)
GFR calc non Af Amer: >60 mL/min (ref >60)
Glucose, Bld: 69 mg/dL (ref 65–99)
Potassium: 3.8 mmol/L (ref 3.5–5.1)
Sodium: 138 mmol/L (ref 135–145)
Total Bilirubin: 1.2 mg/dL (ref 0.3–1.2)
Total Protein: 5.4 g/dL — ABNORMAL LOW (ref 6.5–8.1)

## 2016-01-08 LAB — LIPID PANEL
Cholesterol: 133 mg/dL (ref 0–200)
HDL: 33 mg/dL — ABNORMAL LOW (ref >40)
LDL Cholesterol: 86 mg/dL (ref 0–99)
Total CHOL/HDL Ratio: 4 RATIO
Triglycerides: 69 mg/dL (ref <150)
VLDL: 14 mg/dL (ref 0–40)

## 2016-01-08 LAB — CBC
HCT: 32.7 % — ABNORMAL LOW (ref 36.0–46.0)
Hemoglobin: 10.9 g/dL — ABNORMAL LOW (ref 12.0–15.0)
MCH: 30.4 pg (ref 26.0–34.0)
MCHC: 33.3 g/dL (ref 30.0–36.0)
MCV: 91.3 fL (ref 78.0–100.0)
Platelets: 126 10*3/uL — ABNORMAL LOW (ref 150–400)
RBC: 3.58 MIL/uL — ABNORMAL LOW (ref 3.87–5.11)
RDW: 12.9 % (ref 11.5–15.5)
WBC: 6 10*3/uL (ref 4.0–10.5)

## 2016-01-08 LAB — LIPASE, BLOOD: Lipase: 640 U/L — ABNORMAL HIGH (ref 11–51)

## 2016-01-08 MED ORDER — ENOXAPARIN SODIUM 40 MG/0.4ML ~~LOC~~ SOLN
40.0000 mg | SUBCUTANEOUS | Status: DC
  Filled 2016-01-08 (×4): qty 0.4

## 2016-01-08 MED ORDER — ATENOLOL 50 MG PO TABS
25.0000 mg | ORAL_TABLET | Freq: Every day | ORAL | Status: DC
  Administered 2016-01-08 – 2016-01-16 (×9): 25 mg via ORAL
  Filled 2016-01-08 (×9): qty 1

## 2016-01-08 MED ORDER — SODIUM CHLORIDE 0.9 % IV SOLN
1000.0000 mL | INTRAVENOUS | Status: DC
  Administered 2016-01-08 – 2016-01-09 (×2): 1000 mL via INTRAVENOUS

## 2016-01-08 MED ORDER — MORPHINE SULFATE (PF) 2 MG/ML IV SOLN
1.0000 mg | INTRAVENOUS | Status: DC | PRN
  Administered 2016-01-10 – 2016-01-11 (×4): 1 mg via INTRAVENOUS
  Filled 2016-01-08 (×6): qty 1

## 2016-01-08 NOTE — Progress Notes (Signed)
Felicia PieriniBarbara Obrien 9:36 AM  Subjective: Patient's case discussed with my partners and she is doing better today and her history was reviewed and she has not needed any pain medicine in a while and right now is just sore and is thinking about clear liquids and is urinating fine and has no other complaints and her [facility] procedure was discussed Objective: Vital signs stable afebrile patient and the bathroom this morning not examined labs okay  Assessment: Pancreatitis  Plan: Okay to decrease IV fluids and wean PCA pump and will allow clear liquids and hopefully home soon  [facility] Surgical CenterMAGOD,MARC E  Pager [phone] After 5PM or if no answer call [phone]

## 2016-01-08 NOTE — Progress Notes (Signed)
[facility] PROGRESS NOTE  Felicia PieriniBarbara Obrien ZOX:[id number] DOB: 06/24/1955 DOA: 01/07/2016   PCP: Mickie HillierLITTLE,KEVIN LORNE, MD     Assessment/Plan: Principal Problem:   Pancreatitis Active Problems:   Hypertension   Pancreas divisum   Status post endoscopic retrograde cholangiopancreatography   Increased anion gap metabolic acidosis   Dehydration, moderate   HLD (hyperlipidemia)   Acute recurrent pancreatitis    60 y.o. 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 She was recently hospitalized in October after [facility] and experiencing post pancreatitis ERCP. CT scan done at that time revealed slight swelling of the pancreas without abscess or pseudocyst. Her initial lipase was 312 and peaked at 2359. Patient was discharged after [facility]. She has subsequently [facility]. Unfortunately, as has occurred with previous attempts, they were unable to perform sphincterotomy. Since the procedure she has only ingested either water or ginger ale. Yesterday evening she attempted a Popsicle and developed significant abdominal pain followed by nausea and vomiting. She has not had any diarrhea. She reports she no longer takes pancreatic enzymes since her primary [facility] stated they would not be helpful given the etiology of her pancreatitis. She continues to have significant abdominal pain. Of note she has upper lip edema that she reports was related to unintentional trauma during endoscopy on 11/10.   Assessment and plan  Recurrent, acute Pancreatitis Status post endoscopic retrograde cholangiopancreatography /known Pancreas divisum -Patient with history of pancreatitis post prior ERCP that has responded to symptom management -NPO, IV fluids -Scheduled Zofran and prn Phenergan to treat nausea and vomiting Discontinue morphine PCA when pain is improved -Lipase  continues to trend upwards -Discussed with GI/Edwards, who was familiar with this patient, he recommended at this juncture no indication to pursue CT abdomen and pelvis and to focus on symptom management/supportive care-  -CT scan indications would be: Fever, worsened pain, increasing lipase, or leukocytosis at this point, would continue supportive therapy with IV fluids and pain medications. Hopefully she will respond rapidly as she did the last time. If not we can discuss [facility]. If she responds it can tolerate nutrition, she will follow-up with Dr.Burbridge in the future to discuss possible surgical therapy -Does have slight elevation in total bilirubin with other LFTs normal and this is more suggestive of volume depletion-repeat chemistries in a.m. -No hypo-or hyperglycemia noted      Increased anion gap metabolic acidosis -Likely related to dehydration -Follow electrolytes    Dehydration, moderate -Normal saline IV fluids at 150 mL/h    Hypertension Resume atenolol at lower dose      HLD (hyperlipidemia) -NPO so preadmission statin on hold    Thrombocytopenia-previously platelet count has been between 132-146, suspect patient was dehydrated yesterday with platelet count 192,   DVT prophylaxsis lovenox   Code Status:  Full code       Family Communication: Discussed in detail with the patient, all imaging results, lab results explained to the patient   Disposition Plan:   2 to 3 days     Consultants:  Gastroenterology  Procedures:  None  Antibiotics: Anti-infectives    None         HPI/Subjective: Still having some lower abdominal cramping  Objective: Vitals:   01/08/16 0000 01/08/16 0443 01/08/16 0513 01/08/16 0819  BP:   (!) 106/49   Pulse:   92   Resp:  17 17 18 19   Temp:   99.8 F (37.7 C)   TempSrc:   Oral   SpO2: 99% 99% 100% 99%  Weight:      Height:        Intake/Output Summary (Last 24 hours) at 01/08/16  0918 Last data filed at 01/08/16 0554  Gross per 24 hour  Intake              775 ml  Output             1900 ml  Net            -1125 ml    Exam:  Examination:  General exam: Appears calm and comfortable  Respiratory system: Clear to auscultation. Respiratory effort normal. Cardiovascular system: S1 & S2 heard, RRR. No JVD, murmurs, rubs, gallops or clicks. No pedal edema. Gastrointestinal system: Abdomen is nondistended, soft and nontender. No organomegaly or masses felt. Normal bowel sounds heard. Central nervous system: Alert and oriented. No focal neurological deficits. Extremities: Symmetric 5 x 5 power. Skin: No rashes, lesions or ulcers Psychiatry: Judgement and insight appear normal. Mood & affect appropriate.     Data Reviewed: I have personally reviewed following labs and imaging studies  Micro Results No results found for this or any previous visit (from the past 240 hour(s)).  Radiology Reports Ct Abdomen Pelvis W Contrast  Result Date: 12/20/2015 CLINICAL DATA:  Chronic right upper quadrant abdominal pain, increased today. Nausea. Status post ERCP today. EXAM: CT ABDOMEN AND PELVIS WITH CONTRAST TECHNIQUE: Multidetector CT imaging of the abdomen and pelvis was performed using the standard protocol following bolus administration of intravenous contrast. CONTRAST:  ISOVUE-300 IOPAMIDOL (ISOVUE-300) INJECTION 61% COMPARISON:  MRI abdomen dated 11/05/2015 FINDINGS: Lower chest: Lung bases clear. Hepatobiliary: Liver is within normal limits. Mild periportal edema. Vicarious excretion of contrast in the gallbladder. Pancreas: Suspected pancreatic divisum with mild prominence of the dorsal pancreatic duct, measuring 6 mm (series 2/ image 32). No associated pancreatic mass or atrophy. Spleen: Within normal limits. Adrenals/Urinary Tract: Adrenal glands are within normal limits. 4 mm right upper pole renal cyst (series 2/ image 32). Left kidney is within normal limits. No  hydronephrosis. Bladder is within normal limits. Stomach/Bowel: Stomach is within normal limits. No evidence of bowel obstruction. Normal appendix (series 2/ image 56). Vascular/Lymphatic: Atherosclerotic calcifications of the abdominal aorta and branch vessels. No evidence of abdominal aortic aneurysm. No suspicious abdominopelvic lymphadenopathy. Reproductive: Uterus is within normal limits. Left ovary is within normal limits.  No right adnexal mass. Other: Trace pelvic ascites (series 2/image 70). Musculoskeletal: Very mild degenerative changes of the lower thoracic spine. IMPRESSION: Suspected pancreatic divisum with stable mild prominence of the dorsal pancreatic duct. Vicarious excretion of contrast in the gallbladder. Mild periportal edema. Electronically Signed   By: Charline Bills M.D.   On: 12/20/2015 17:56   Dg Abdomen Acute W/chest  Result Date: 12/20/2015 CLINICAL DATA:  Right upper quadrant pain after endoscopy today. EXAM: DG ABDOMEN ACUTE W/ 1V CHEST COMPARISON:  None. FINDINGS: There is no evidence of dilated bowel loops or free intraperitoneal air. No radiopaque calculi or other significant radiographic abnormality is seen. Contrast is seen within both renal collecting systems and bladder without evidence of obstruction. Opacification of the gallbladder is also seen presumably from same day endoscopic procedure. Heart size and mediastinal contours are within normal limits. Both lungs are clear. IMPRESSION: Negative abdominal radiographs.  No acute cardiopulmonary disease. Electronically Signed   By: Tollie Eth  M.D.   On: 12/20/2015 18:35     CBC  Recent Labs Lab 01/07/16 0300 01/08/16 0730  WBC 8.1 6.0  HGB 13.4 10.9*  HCT 39.1 32.7*  PLT 192 126*  MCV 89.9 91.3  MCH 30.8 30.4  MCHC 34.3 33.3  RDW 12.6 12.9  LYMPHSABS 1.0  --   MONOABS 0.5  --   EOSABS 0.0  --   BASOSABS 0.0  --     Chemistries   Recent Labs Lab 01/07/16 0300 01/07/16 0956 01/08/16 0730  NA  139  --  138  K 3.7  --  3.8  CL 102  --  108  CO2 20*  --  19*  GLUCOSE 101*  --  69  BUN 12  --  <5*  CREATININE 0.94  --  0.79  CALCIUM 10.0  --  8.7*  MG  --  1.7  --   AST 28  --  17  ALT 20  --  13*  ALKPHOS 60  --  45  BILITOT 1.5*  --  1.2   ------------------------------------------------------------------------------------------------------------------ estimated creatinine clearance is 65.5 mL/min (by C-G formula based on SCr of 0.79 mg/dL). ------------------------------------------------------------------------------------------------------------------ No results for input(s): HGBA1C in the last 72 hours. ------------------------------------------------------------------------------------------------------------------ No results for input(s): CHOL, HDL, LDLCALC, TRIG, CHOLHDL, LDLDIRECT in the last 72 hours. ------------------------------------------------------------------------------------------------------------------ No results for input(s): TSH, T4TOTAL, T3FREE, THYROIDAB in the last 72 hours.  Invalid input(s): FREET3 ------------------------------------------------------------------------------------------------------------------ No results for input(s): VITAMINB12, FOLATE, FERRITIN, TIBC, IRON, RETICCTPCT in the last 72 hours.  Coagulation profile No results for input(s): INR, PROTIME in the last 168 hours.  No results for input(s): DDIMER in the last 72 hours.  Cardiac Enzymes No results for input(s): CKMB, TROPONINI, MYOGLOBIN in the last 168 hours.  Invalid input(s): CK ------------------------------------------------------------------------------------------------------------------ Invalid input(s): POCBNP   CBG: No results for input(s): GLUCAP in the last 168 hours.     Studies: No results found.    No results found for: HGBA1C Lab Results  Component Value Date   CREATININE 0.79 01/08/2016       Scheduled Meds: . morphine    Intravenous Q4H  . ondansetron (ZOFRAN) IV  4 mg Intravenous Q6H   Continuous Infusions: . sodium chloride 1,000 mL (01/08/16 0554)     LOS: 1 day    Time spent: >30 MINS    [facility],NAYANA  [facility] Pager [phone]. If 7PM-7AM, please contact night-coverage at [email or web address], password [email or web address] 01/08/2016, 9:18 AM  LOS: 1 day

## 2016-01-09 DIAGNOSIS — K853 Drug induced acute pancreatitis without necrosis or infection: Secondary | ICD-10-CM

## 2016-01-09 DIAGNOSIS — E782 Mixed hyperlipidemia: Secondary | ICD-10-CM

## 2016-01-09 LAB — COMPREHENSIVE METABOLIC PANEL
ALT: 13 U/L — ABNORMAL LOW (ref 14–54)
AST: 17 U/L (ref 15–41)
Albumin: 2.9 g/dL — ABNORMAL LOW (ref 3.5–5.0)
Alkaline Phosphatase: 43 U/L (ref 38–126)
Anion gap: 10 (ref 5–15)
BUN: <5 mg/dL — ABNORMAL LOW (ref 6–20)
CO2: 18 mmol/L — ABNORMAL LOW (ref 22–32)
Calcium: 8.8 mg/dL — ABNORMAL LOW (ref 8.9–10.3)
Chloride: 110 mmol/L (ref 101–111)
Creatinine, Ser: 0.77 mg/dL (ref 0.44–1.00)
GFR calc Af Amer: >60 mL/min (ref >60)
GFR calc non Af Amer: >60 mL/min (ref >60)
Glucose, Bld: 86 mg/dL (ref 65–99)
Potassium: 3.6 mmol/L (ref 3.5–5.1)
Sodium: 138 mmol/L (ref 135–145)
Total Bilirubin: 1.4 mg/dL — ABNORMAL HIGH (ref 0.3–1.2)
Total Protein: 5.6 g/dL — ABNORMAL LOW (ref 6.5–8.1)

## 2016-01-09 LAB — CBC
HCT: 31.2 % — ABNORMAL LOW (ref 36.0–46.0)
Hemoglobin: 10.7 g/dL — ABNORMAL LOW (ref 12.0–15.0)
MCH: 30.8 pg (ref 26.0–34.0)
MCHC: 34.3 g/dL (ref 30.0–36.0)
MCV: 89.9 fL (ref 78.0–100.0)
Platelets: 135 10*3/uL — ABNORMAL LOW (ref 150–400)
RBC: 3.47 MIL/uL — ABNORMAL LOW (ref 3.87–5.11)
RDW: 12.9 % (ref 11.5–15.5)
WBC: 5.3 10*3/uL (ref 4.0–10.5)

## 2016-01-09 LAB — LIPASE, BLOOD: Lipase: 485 U/L — ABNORMAL HIGH (ref 11–51)

## 2016-01-09 MED ORDER — SODIUM CHLORIDE 0.9 % IV SOLN
1000.0000 mL | INTRAVENOUS | Status: DC
  Administered 2016-01-09 – 2016-01-10 (×4): 1000 mL via INTRAVENOUS

## 2016-01-09 MED ORDER — ALPRAZOLAM 0.25 MG PO TABS
0.1250 mg | ORAL_TABLET | Freq: Every evening | ORAL | Status: DC | PRN
  Administered 2016-01-09 – 2016-01-15 (×6): 0.125 mg via ORAL
  Filled 2016-01-09 (×5): qty 1

## 2016-01-09 NOTE — Progress Notes (Signed)
[facility] PROGRESS NOTE  Felicia Obrien ZOX:[id number] DOB: November 30, 1955 DOA: 01/07/2016   PCP: Mickie Hillier, MD     Assessment/Plan: Principal Problem:   PancNiasia Lan[place] Problems:   Hypertension   Pancreas divisum   Status post endoscopic retrograde cholangiopancreatography   Increased anion gap metabolic acidosis   Dehydration, moderate   HLD (hyperlipidemia)   Acute recurrent pancreatitis    60 y.o. female with medical history significant for recurrent pancreatitis since her 33s. She was recently hospitalized in October after undergoing ERCP at [facility] on 10/25 and experiencing post pancreatitis ERCP. CT scan done at that time revealed slight swelling of the pancreas without abscess or pseudocyst. Her initial lipase was 312 and peaked at 2359. Patient was discharged after 4 day stay here at [facility]. She has subsequently undergone another ERCP at [facility] on 11/10. Unfortunately, as has occurred with previous attempts, they were unable to perform sphincterotomy. Since the procedure she has only ingested either water or ginger ale.  11/12  developed significant abdominal pain followed by nausea and vomiting. She has not had any diarrhea. She reports she no longer takes pancreatic enzymes since her primary gastroenterologist at [facility] stated they would not be helpful given the etiology of her pancreatitis. She continues to have significant abdominal pain. Of note she has upper lip edema that she reports was related to unintentional trauma during endoscopy on 11/10.   Assessment and plan  Recurrent, acute Pancreatitis Status post endoscopic retrograde cholangiopancreatography /known Pancreas divisum -Patient with history of pancreatitis post prior ERCP that has responded to symptom management Advance to full liquid , cont IV fluids -Scheduled Zofran and prn Phenergan to treat nausea and vomiting Discontinued morphine PCA  11/12 -Lipase continues to trend  upwards -Discussed with GI/Edwards, who was familiar with this patient, he recommended at this juncture no indication to pursue CT abdomen and pelvis and to focus on symptom management/supportive care-  -CT scan indications would be: Fever, worsened pain, increasing lipase, or leukocytosis at this point, would continue supportive therapy with IV fluids and pain medications. Hopefully she will respond rapidly as she did the last time. If not we can discuss transfer to [facility]. If she responds it can tolerate nutrition, she will follow-up with Dr.Burbridge in the future to discuss possible surgical therapy -Does have slight elevation in total bilirubin with other LFTs normal and this is more suggestive of volume depletion-repeat chemistries in a.m. GI following , recommending advance diet tomorrow      Increased anion gap metabolic acidosis -Likely related to dehydration -Follow electrolytes    Dehydration, moderate Continue IV fluids    Hypertension Resume atenolol at lower dose      HLD (hyperlipidemia) -NPO so preadmission statin on hold    Thrombocytopenia-previously platelet count has been between 132-146, suspect patient was dehydrated yesterday with platelet count 192,   DVT prophylaxsis lovenox   Code Status:  Full code       Family Communication: Discussed in detail with the patient, all imaging results, lab results explained to the patient   Disposition Plan:   2 to 3 days     Consultants:  Gastroenterology  Procedures:  None  Antibiotics: Anti-infectives    None         HPI/Subjective: Still having some lower abdominal cramping  Objective: Vitals:   01/08/16 1520 01/08/16 2106 01/09/16 0604 01/09/16 0655  BP:  (!) 132/58 (!) 114/53   Pulse:  89 85   Resp:  18 17   Temp: 99.6 F (37.6 C) 99.7 F (37.6 C) 100 F (37.8 C) 99 F (37.2 C)  TempSrc: Oral Oral Oral Oral  SpO2:  98% 98%   Weight:      Height:         Intake/Output Summary (Last 24 hours) at 01/09/16 0945 Last data filed at 01/09/16 0400  Gross per 24 hour  Intake             2300 ml  Output             2000 ml  Net              300 ml    Exam:  Examination:  General exam: Appears calm and comfortable  Respiratory system: Clear to auscultation. Respiratory effort normal. Cardiovascular system: S1 & S2 heard, RRR. No JVD, murmurs, rubs, gallops or clicks. No pedal edema. Gastrointestinal system: Abdomen is nondistended, soft and nontender. No organomegaly or masses felt. Normal bowel sounds heard. Central nervous system: Alert and oriented. No focal neurological deficits. Extremities: Symmetric 5 x 5 power. Skin: No rashes, lesions or ulcers Psychiatry: Judgement and insight appear normal. Mood & affect appropriate.     Data Reviewed: I have personally reviewed following labs and imaging studies  Micro Results No results found for this or any previous visit (from the past 240 hour(s)).  Radiology Reports Ct Abdomen Pelvis W Contrast  Result Date: 12/20/2015 CLINICAL DATA:  Chronic right upper quadrant abdominal pain, increased today. Nausea. Status post ERCP today. EXAM: CT ABDOMEN AND PELVIS WITH CONTRAST TECHNIQUE: Multidetector CT imaging of the abdomen and pelvis was performed using the standard protocol following bolus administration of intravenous contrast. CONTRAST:  100mL ISOVUE-300 IOPAMIDOL (ISOVUE-300) INJECTION 61% COMPARISON:  MRI abdomen dated 11/05/2015 FINDINGS: Lower chest: Lung bases clear. Hepatobiliary: Liver is within normal limits. Mild periportal edema. Vicarious excretion of contrast in the gallbladder. Pancreas: Suspected pancreatic divisum with mild prominence of the dorsal pancreatic duct, measuring 6 mm (series 2/ image 32). No associated pancreatic mass or atrophy. Spleen: Within normal limits. Adrenals/Urinary Tract: Adrenal glands are within normal limits. 4 mm right upper pole renal cyst (series  2/ image 32). Left kidney is within normal limits. No hydronephrosis. Bladder is within normal limits. Stomach/Bowel: Stomach is within normal limits. No evidence of bowel obstruction. Normal appendix (series 2/ image 56). Vascular/Lymphatic: Atherosclerotic calcifications of the abdominal aorta and branch vessels. No evidence of abdominal aortic aneurysm. No suspicious abdominopelvic lymphadenopathy. Reproductive: Uterus is within normal limits. Left ovary is within normal limits.  No right adnexal mass. Other: Trace pelvic ascites (series 2/image 70). Musculoskeletal: Very mild degenerative changes of the lower thoracic spine. IMPRESSION: Suspected pancreatic divisum with stable mild prominence of the dorsal pancreatic duct. Vicarious excretion of contrast in the gallbladder. Mild periportal edema. Electronically Signed   By: Charline BillsSriyesh  Krishnan M.D.   On: 12/20/2015 17:56   Dg Abdomen Acute W/chest  Result Date: 12/20/2015 CLINICAL DATA:  Right upper quadrant pain after endoscopy today. EXAM: DG ABDOMEN ACUTE W/ 1V CHEST COMPARISON:  None. FINDINGS: There is no evidence of dilated bowel loops or free intraperitoneal air. No radiopaque calculi or other significant radiographic abnormality is seen. Contrast is seen within both renal collecting systems and bladder without evidence of obstruction. Opacification of the gallbladder is also seen presumably from same day endoscopic procedure. Heart size and mediastinal contours are within normal limits. Both lungs are clear. IMPRESSION: Negative abdominal radiographs.  No acute cardiopulmonary  disease. Electronically Signed   By: Tollie Eth M.D.   On: 12/20/2015 18:35     CBC  Recent Labs Lab 01/07/16 0300 01/08/16 0730 01/09/16 0514  WBC 8.1 6.0 5.3  HGB 13.4 10.9* 10.7*  HCT 39.1 32.7* 31.2*  PLT 192 126* 135*  MCV 89.9 91.3 89.9  MCH 30.8 30.4 30.8  MCHC 34.3 33.3 34.3  RDW 12.6 12.9 12.9  LYMPHSABS 1.0  --   --   MONOABS 0.5  --   --   EOSABS  0.0  --   --   BASOSABS 0.0  --   --     Chemistries   Recent Labs Lab 01/07/16 0300 01/07/16 0956 01/08/16 0730 01/09/16 0514  NA 139  --  138 138  K 3.7  --  3.8 3.6  CL 102  --  108 110  CO2 20*  --  19* 18*  GLUCOSE 101*  --  69 86  BUN 12  --  <5* <5*  CREATININE 0.94  --  0.79 0.77  CALCIUM 10.0  --  8.7* 8.8*  MG  --  1.7  --   --   AST 28  --  17 17  ALT 20  --  13* 13*  ALKPHOS 60  --  45 43  BILITOT 1.5*  --  1.2 1.4*   ------------------------------------------------------------------------------------------------------------------ estimated creatinine clearance is 65.5 mL/min (by C-G formula based on SCr of 0.77 mg/dL). ------------------------------------------------------------------------------------------------------------------ No results for input(s): HGBA1C in the last 72 hours. ------------------------------------------------------------------------------------------------------------------  Recent Labs  01/08/16 0730  CHOL 133  HDL 33*  LDLCALC 86  TRIG 69  CHOLHDL 4.0   ------------------------------------------------------------------------------------------------------------------ No results for input(s): TSH, T4TOTAL, T3FREE, THYROIDAB in the last 72 hours.  Invalid input(s): FREET3 ------------------------------------------------------------------------------------------------------------------ No results for input(s): VITAMINB12, FOLATE, FERRITIN, TIBC, IRON, RETICCTPCT in the last 72 hours.  Coagulation profile No results for input(s): INR, PROTIME in the last 168 hours.  No results for input(s): DDIMER in the last 72 hours.  Cardiac Enzymes No results for input(s): CKMB, TROPONINI, MYOGLOBIN in the last 168 hours.  Invalid input(s): CK ------------------------------------------------------------------------------------------------------------------ Invalid input(s): POCBNP   CBG: No results for input(s): GLUCAP in the last 168  hours.     Studies: No results found.    No results found for: HGBA1C Lab Results  Component Value Date   LDLCALC 86 01/08/2016   CREATININE 0.77 01/09/2016       Scheduled Meds: . atenolol  25 mg Oral Daily  . enoxaparin (LOVENOX) injection  40 mg Subcutaneous Q24H  . ondansetron (ZOFRAN) IV  4 mg Intravenous Q6H   Continuous Infusions: . sodium chloride       LOS: 2 days    Time spent: >30 MINS    [facility]  [facility] Pager [phone]. If 7PM-7AM, please contact night-coverage at [email or web address], password [facility] 01/09/2016, 9:45 AM  LOS: 2 days

## 2016-01-09 NOTE — Care Management Note (Signed)
Case Management Note  Patient Details  Name: Donn PieriniBarbara Mccalla MRN: [id number] Date of Birth: [birth year]  Subjective/Objective:                 Patient readmitted from home with husband, with recurrent pancreatitis. IVF at 75, CL diet, adv to full, still c/o pain when up and moving per RN.    Action/Plan:  Anticipate DC to home when medically clear, no needs identified.   Expected Discharge Date:                  Expected Discharge Plan:  Home/Self Care  In-House Referral:  NA  Discharge planning Services  CM Consult  Post Acute Care Choice:    Choice offered to:     DME Arranged:    DME Agency:     HH Arranged:    HH Agency:     Status of Service:     If discussed at [employer] Length of Stay Meetings, dates discussed:    Additional Comments:  Lawerance SabalDebbie Swist, RN 01/09/2016, 10:21 AM

## 2016-01-09 NOTE — Progress Notes (Signed)
Donn PieriniBarbara Kappes 9:28 AM  Subjective: Patient doing a little better but still is some discomfort and nausea and only drinking a little clear liquids and we rediscussed her procedure and she said enzymes probably did not help her before and we answered all of her questions  Objective: Vital signs stable afebrile no acute distress abdomen is soft occasional bowel sounds mildly tender throughout labs okay slight decrease lipase  Assessment: Pancreatitis  Plan: Continue present management hopefully we can advance diet tomorrow  [facility],MARC E  Pager [phone] After 5PM or if no answer call [phone]

## 2016-01-09 NOTE — Progress Notes (Signed)
Pt reported she only takes 0.125mg  of Xanax. NP notified. Orders changed. The newly ordered dose was given to pt. The remaining half was given wasted in the presence of Clydie BraunKaren, Charity fundraiserN in [place], 5W. Pharmacy was notified as well. Will continue to monitor.

## 2016-01-10 ENCOUNTER — Inpatient Hospital Stay (HOSPITAL_COMMUNITY): Payer: Managed Care, Other (non HMO)

## 2016-01-10 ENCOUNTER — Encounter (HOSPITAL_COMMUNITY): Payer: Self-pay | Admitting: Radiology

## 2016-01-10 DIAGNOSIS — E872 Acidosis: Secondary | ICD-10-CM

## 2016-01-10 DIAGNOSIS — E860 Dehydration: Secondary | ICD-10-CM

## 2016-01-10 DIAGNOSIS — K859 Acute pancreatitis without necrosis or infection, unspecified: Principal | ICD-10-CM

## 2016-01-10 DIAGNOSIS — Z9889 Other specified postprocedural states: Secondary | ICD-10-CM

## 2016-01-10 LAB — COMPREHENSIVE METABOLIC PANEL
ALT: 12 U/L — ABNORMAL LOW (ref 14–54)
AST: 17 U/L (ref 15–41)
Albumin: 2.6 g/dL — ABNORMAL LOW (ref 3.5–5.0)
Alkaline Phosphatase: 44 U/L (ref 38–126)
Anion gap: 11 (ref 5–15)
BUN: <5 mg/dL — ABNORMAL LOW (ref 6–20)
CO2: 17 mmol/L — ABNORMAL LOW (ref 22–32)
Calcium: 8.7 mg/dL — ABNORMAL LOW (ref 8.9–10.3)
Chloride: 108 mmol/L (ref 101–111)
Creatinine, Ser: 0.71 mg/dL (ref 0.44–1.00)
GFR calc Af Amer: >60 mL/min (ref >60)
GFR calc non Af Amer: >60 mL/min (ref >60)
Glucose, Bld: 100 mg/dL — ABNORMAL HIGH (ref 65–99)
Potassium: 3.2 mmol/L — ABNORMAL LOW (ref 3.5–5.1)
Sodium: 136 mmol/L (ref 135–145)
Total Bilirubin: 1.2 mg/dL (ref 0.3–1.2)
Total Protein: 5.4 g/dL — ABNORMAL LOW (ref 6.5–8.1)

## 2016-01-10 LAB — LIPASE, BLOOD: Lipase: 379 U/L — ABNORMAL HIGH (ref 11–51)

## 2016-01-10 MED ORDER — IOPAMIDOL (ISOVUE-300) INJECTION 61%
INTRAVENOUS | Status: AC
  Administered 2016-01-10: 100 mL
  Filled 2016-01-10: qty 100

## 2016-01-10 MED ORDER — IOPAMIDOL (ISOVUE-300) INJECTION 61%
15.0000 mL | INTRAVENOUS | Status: AC
  Administered 2016-01-10: 15 mL via ORAL

## 2016-01-10 MED ORDER — POTASSIUM CHLORIDE 20 MEQ/15ML (10%) PO SOLN
40.0000 meq | Freq: Every day | ORAL | Status: DC
  Filled 2016-01-10: qty 30

## 2016-01-10 MED ORDER — POTASSIUM CHLORIDE IN NACL 20-0.9 MEQ/L-% IV SOLN
INTRAVENOUS | Status: DC
  Administered 2016-01-10 – 2016-01-11 (×2): via INTRAVENOUS
  Filled 2016-01-10 (×3): qty 1000

## 2016-01-10 MED ORDER — ALUM & MAG HYDROXIDE-SIMETH 200-200-20 MG/5ML PO SUSP
30.0000 mL | Freq: Four times a day (QID) | ORAL | Status: DC | PRN
  Administered 2016-01-10 – 2016-01-12 (×2): 30 mL via ORAL
  Filled 2016-01-10 (×2): qty 30

## 2016-01-10 MED ORDER — POTASSIUM CHLORIDE CRYS ER 20 MEQ PO TBCR
40.0000 meq | EXTENDED_RELEASE_TABLET | Freq: Once | ORAL | Status: AC
  Administered 2016-01-10: 40 meq via ORAL
  Filled 2016-01-10: qty 2

## 2016-01-10 NOTE — Progress Notes (Signed)
Pt reported having some indigestion. Maalox, a standing order was ordered and given to the pt. Will continue to monitor.

## 2016-01-10 NOTE — Progress Notes (Addendum)
PROGRESS NOTE    Felicia Obrien  [id number]:[id number] DOB: 26-Apr-1955 DOA: 01/07/2016 PCP: Mickie HillierLITTLE,KEVIN LORNE, MD   Chief Complaint  Patient presents with  . Abdominal Pain    Brief Narrative:  HPI on 01/07/2016 by Ms. Junious SilkAllison Ellis, NP Felicia Obrien is a 60 y.o. 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 She was recently hospitalized in October after [facility] and experiencing post pancreatitis ERCP. CT scan done at that time revealed slight swelling of the pancreas without abscess or pseudocyst. Her initial lipase was 312 and peaked at 2359. Patient was discharged after 4 day stay [facility]. She has subsequently [facility]. Unfortunately, as has occurred with previous attempts, they were unable to perform sphincterotomy. Since the procedure she has only ingested either water or ginger ale. Yesterday evening she attempted a Popsicle and developed significant abdominal pain followed by nausea and vomiting. She has not had any diarrhea. She reports she no longer takes pancreatic enzymes since [facility] stated they would not be helpful given the etiology of her pancreatitis. She continues to have significant abdominal pain. Of note she has upper lip edema that she reports was related to unintentional trauma during endoscopy on 11/10. Assessment & Plan   Recurrent, acute Pancreatitis s/p ERCP  -Patient with history of pancreatitis post prior ERCPthat has responded to symptom management, h/o pancreas divisum -Currently on full liquid diet, but continues to have nausea and vomiting  -Continue antiemetics PRN and scheduled -Gastroenterology consulted and appreciated  -Scheduled Zofran and prnPhenergan to treat nausea and vomiting (Discontinued morphine PCA  11/12) -Lipase trending downward  -plan for CT abdomen pelvis with contrast    Increased anion gap metabolic  acidosis -Likely related to dehydration -Continue to monitor BMP  Dehydration, moderate -Continue IV fluids  Hypertension -Continue atenolol 25mg  daily (reduced from home dose 50 mg)   Hyperlipidemia -Statin currently held  Thrombocytopenia -Upon admission, platelets were 192, dropped to 126 (likely dilutional component) -Will continue to monitor CBC  Hypokalemia -replace and monitor  DVT Prophylaxis  Lovenox  Code Status: Full  Family Communication: None at bedside  Disposition Plan: Admitted. Pending further imaging and recommendations per GI.  Consultants Gastroenterology  Procedures  None  Antibiotics   Anti-infectives    None      Subjective:   Felicia Obrien seen and examined today.  Continues to complain of nausea.  Sometimes feels her stomach does not empty into her intestines, but has had several bowel movements. Denies chest pain, shortness of breath, dizziness, headache.    Objective:   Vitals:   01/09/16 2050 01/10/16 0422 01/10/16 0730 01/10/16 0850  BP: (!) 119/59 (!) 122/48 (!) 116/52 (!) 124/56  Pulse: 70 81 75 79  Resp: 16 17 15    Temp: 99.3 F (37.4 C) 98.2 F (36.8 C) 98.7 F (37.1 C)   TempSrc: Oral Oral Oral   SpO2: 99% 96% 97%   Weight:      Height:        Intake/Output Summary (Last 24 hours) at 01/10/16 1146 Last data filed at 01/10/16 0508  Gross per 24 hour  Intake          2316.66 ml  Output              700 ml  Net          1616.66 ml   Filed Weights   01/07/16 0248 01/07/16  0445 01/07/16 0953  Weight: 64.6 kg (142 lb 8 oz) 64.4 kg (142 lb) 63.5 kg (140 lb)    Exam  General: Well developed, well nourished, NAD, appears stated age  HEENT: NCAT, mucous membranes moist.   Cardiovascular: S1 S2 auscultated, no murmurs, RRR  Respiratory: Clear to auscultation bilaterally with equal chest rise  Abdomen: Soft, nontender, nondistended, + bowel sounds  Extremities: warm dry without cyanosis clubbing or  edema  Neuro: AAOx3, nonfocal  Psych: Normal affect and demeanor with intact judgement and insight   Data Reviewed: I have personally reviewed following labs and imaging studies  CBC:  Recent Labs Lab 01/07/16 0300 01/08/16 0730 01/09/16 0514  WBC 8.1 6.0 5.3  NEUTROABS 6.6  --   --   HGB 13.4 10.9* 10.7*  HCT 39.1 32.7* 31.2*  MCV 89.9 91.3 89.9  PLT 192 126* 135*   Basic Metabolic Panel:  Recent Labs Lab 01/07/16 0300 01/07/16 0956 01/08/16 0730 01/09/16 0514 01/10/16 0458  NA 139  --  138 138 136  K 3.7  --  3.8 3.6 3.2*  CL 102  --  108 110 108  CO2 20*  --  19* 18* 17*  GLUCOSE 101*  --  69 86 100*  BUN 12  --  <5* <5* <5*  CREATININE 0.94  --  0.79 0.77 0.71  CALCIUM 10.0  --  8.7* 8.8* 8.7*  MG  --  1.7  --   --   --   PHOS  --  3.9  --   --   --    GFR: Estimated Creatinine Clearance: 65.5 mL/min (by C-G formula based on SCr of 0.71 mg/dL). Liver Function Tests:  Recent Labs Lab 01/07/16 0300 01/08/16 0730 01/09/16 0514 01/10/16 0458  AST 28 17 17 17   ALT 20 13* 13* 12*  ALKPHOS 60 45 43 44  BILITOT 1.5* 1.2 1.4* 1.2  PROT 7.4 5.4* 5.6* 5.4*  ALBUMIN 4.1 3.0* 2.9* 2.6*    Recent Labs Lab 01/07/16 0300 01/08/16 0730 01/09/16 0514 01/10/16 0458  LIPASE 1,079* 640* 485* 379*   No results for input(s): AMMONIA in the last 168 hours. Coagulation Profile: No results for input(s): INR, PROTIME in the last 168 hours. Cardiac Enzymes: No results for input(s): CKTOTAL, CKMB, CKMBINDEX, TROPONINI in the last 168 hours. BNP (last 3 results) No results for input(s): PROBNP in the last 8760 hours. HbA1C: No results for input(s): HGBA1C in the last 72 hours. CBG: No results for input(s): GLUCAP in the last 168 hours. Lipid Profile:  Recent Labs  01/08/16 0730  CHOL 133  HDL 33*  LDLCALC 86  TRIG 69  CHOLHDL 4.0   Thyroid Function Tests: No results for input(s): TSH, T4TOTAL, FREET4, T3FREE, THYROIDAB in the last 72 hours. Anemia  Panel: No results for input(s): VITAMINB12, FOLATE, FERRITIN, TIBC, IRON, RETICCTPCT in the last 72 hours. Urine analysis:    Component Value Date/Time   COLORURINE YELLOW 12/20/2015 1833   APPEARANCEUR CLEAR 12/20/2015 1833   LABSPEC >1.046 (H) 12/20/2015 1833   PHURINE 6.0 12/20/2015 1833   GLUCOSEU NEGATIVE 12/20/2015 1833   HGBUR NEGATIVE 12/20/2015 1833   BILIRUBINUR NEGATIVE 12/20/2015 1833   KETONESUR 40 (A) 12/20/2015 1833   PROTEINUR NEGATIVE 12/20/2015 1833   NITRITE NEGATIVE 12/20/2015 1833   LEUKOCYTESUR NEGATIVE 12/20/2015 1833   Sepsis Labs: @LABRCNTIP (procalcitonin:4,lacticidven:4)  )No results found for this or any previous visit (from the past 240 hour(s)).    Radiology Studies: No results found.  Scheduled Meds: . atenolol  25 mg Oral Daily  . enoxaparin (LOVENOX) injection  40 mg Subcutaneous Q24H  . ondansetron (ZOFRAN) IV  4 mg Intravenous Q6H  . potassium chloride  40 mEq Oral Daily   Continuous Infusions: . sodium chloride 1,000 mL (01/10/16 1051)     LOS: 3 days   Time Spent in minutes   30 minutes  MIKHAIL, MARYANN D.O. on 01/10/2016 at 11:46 AM  Between 7am to 7pm - Pager - [phone]  After 7pm go to [email or web address] - password [email or web address]  And look for the night coverage person covering for me after hours  [facility] Office  [phone]

## 2016-01-10 NOTE — Progress Notes (Signed)
Britta MccreedyBarbara Dass 11:19 AM  Subjective: Patient not doing as well today and case discussed with her 2 daughters and she's having a little bit of pain but mostly nausea and has moved her bowels a little but no new complaints  Objective: Vital signs stable afebrile doesn't look as well lying in bed abdomen is still soft occasional bowel sounds no guarding or rebound mild tender throughout chemistry stable  Assessment: Pancreatitis  Plan: Would repeat CT scan to see how significant the pancreatitis looks and help us decide further workup plans and therapy like possibly TPN or feeding tube [facility]  Pager [phone] After 5PM or if no answer call [phone]

## 2016-01-11 LAB — COMPREHENSIVE METABOLIC PANEL
ALT: 11 U/L — ABNORMAL LOW (ref 14–54)
AST: 15 U/L (ref 15–41)
Albumin: 2.8 g/dL — ABNORMAL LOW (ref 3.5–5.0)
Alkaline Phosphatase: 47 U/L (ref 38–126)
Anion gap: 12 (ref 5–15)
BUN: 5 mg/dL — ABNORMAL LOW (ref 6–20)
CO2: 18 mmol/L — ABNORMAL LOW (ref 22–32)
Calcium: 9.2 mg/dL (ref 8.9–10.3)
Chloride: 107 mmol/L (ref 101–111)
Creatinine, Ser: 0.69 mg/dL (ref 0.44–1.00)
GFR calc Af Amer: >60 mL/min (ref >60)
GFR calc non Af Amer: >60 mL/min (ref >60)
Glucose, Bld: 114 mg/dL — ABNORMAL HIGH (ref 65–99)
Potassium: 4 mmol/L (ref 3.5–5.1)
Sodium: 137 mmol/L (ref 135–145)
Total Bilirubin: 1 mg/dL (ref 0.3–1.2)
Total Protein: 6.2 g/dL — ABNORMAL LOW (ref 6.5–8.1)

## 2016-01-11 LAB — LIPASE, BLOOD
Lipase: 316 U/L — ABNORMAL HIGH (ref 11–51)
Lipase: 333 U/L — ABNORMAL HIGH (ref 11–51)

## 2016-01-11 LAB — CBC WITH DIFFERENTIAL/PLATELET
Basophils Absolute: 0 10*3/uL (ref 0.0–0.1)
Basophils Relative: 0 %
Eosinophils Absolute: 0 10*3/uL (ref 0.0–0.7)
Eosinophils Relative: 0 %
HCT: 33.6 % — ABNORMAL LOW (ref 36.0–46.0)
Hemoglobin: 11.6 g/dL — ABNORMAL LOW (ref 12.0–15.0)
Lymphocytes Relative: 5 %
Lymphs Abs: 0.4 10*3/uL — ABNORMAL LOW (ref 0.7–4.0)
MCH: 30.5 pg (ref 26.0–34.0)
MCHC: 34.5 g/dL (ref 30.0–36.0)
MCV: 88.4 fL (ref 78.0–100.0)
Monocytes Absolute: 0.5 10*3/uL (ref 0.1–1.0)
Monocytes Relative: 6 %
Neutro Abs: 7.6 10*3/uL (ref 1.7–7.7)
Neutrophils Relative %: 89 %
Platelets: 185 10*3/uL (ref 150–400)
RBC: 3.8 MIL/uL — ABNORMAL LOW (ref 3.87–5.11)
RDW: 13 % (ref 11.5–15.5)
WBC: 8.6 10*3/uL (ref 4.0–10.5)

## 2016-01-11 MED ORDER — SODIUM CHLORIDE 0.9 % IV SOLN
INTRAVENOUS | Status: DC
  Administered 2016-01-11 – 2016-01-12 (×3): via INTRAVENOUS

## 2016-01-11 MED ORDER — OCTREOTIDE ACETATE 100 MCG/ML IJ SOLN
100.0000 ug | Freq: Three times a day (TID) | INTRAMUSCULAR | Status: DC
  Administered 2016-01-11 – 2016-01-14 (×9): 100 ug via INTRAVENOUS
  Filled 2016-01-11 (×11): qty 1

## 2016-01-11 NOTE — Progress Notes (Addendum)
Donn PieriniBarbara Graw 9:23 AM  Subjective: Patient seen and examined and CT reviewed and discuss case with her husband as well and unfortunately she has increased nausea and vomiting but no pain which is good and no other new complaints  Objective: Vital signs stable afebrile no acute distress abdomen is soft nontender occasional bowel sounds no guarding rebound or succussion splash white count okay chemistry stable CT showing pseudocysts pressing on stomach Assessment: Pancreatitis  Plan: We discussed TPN versus nasal jejunal tube feeds and will return to clear liquids and if no better tomorrow she will pick 1 or the other method although I would recommend a nasal jejunal type and I have discussed her case with IR who will call me back about the timing of possible drainage or aspiration because even though the timing seems relatively short the cyst seems to be more defined so possibly it could be drained earlier than usual  but if no better by early next week would probably [facility] for [facility] gastroenterologist to proceed under EUS and all of the above was discussed with the patient and her husband  [facility],MARC E  Pager [phone] After 5PM or if no answer call [phone]

## 2016-01-11 NOTE — Progress Notes (Signed)
PROGRESS NOTE    Felicia PieriniBarbara Obrien  UJW:[id number] DOB: 10-30-1955 DOA: 01/07/2016 PCP: Mickie HillierLITTLE,KEVIN LORNE, MD   Chief Complaint  Patient presents with  . Abdominal Pain    Brief Narrative:  HPI on 01/07/2016 by Ms. Junious SilkAllison Ellis, NP Felicia Obrien is a 60 y.o. female with medical history significant for recurrent pancreatitis since her 5620s. She was recently hospitalized in October after [facility] and experiencing post pancreatitis ERCP. CT scan done at that time revealed slight swelling of the pancreas without abscess or pseudocyst. Her initial lipase was 312 and peaked at 2359. Patient was discharged after [facility]. She has subsequently undergone [facility]. Unfortunately, as has occurred with previous attempts, they were unable to perform sphincterotomy. Since the procedure she has only ingested either water or ginger ale. Yesterday evening she attempted a Popsicle and developed significant abdominal pain followed by nausea and vomiting. She has not had any diarrhea. She reports she no longer takes pancreatic enzymes since her [facility] stated they would not be helpful given the etiology of her pancreatitis. She continues to have significant abdominal pain. Of note she has upper lip edema that she reports was related to unintentional trauma during endoscopy on 11/10. Assessment & Plan   Recurrent, acute Pancreatitis s/p ERCP  -Patient with history of pancreatitis post prior ERCPthat has responded to symptom management, h/o pancreas divisum -Currently on full liquid diet, but continues to have nausea and vomiting  -Continue antiemetics PRN and scheduled -Gastroenterology consulted and appreciated  -Scheduled Zofran and prnPhenergan to treat nausea and vomiting (Discontinued morphine PCA  11/12) -Lipase trending downward  -CT abdomen pelvis with contrast: Persistent pancreatic ductal dilatation with  probable large pseudocyst in the lesser sac, likely related to either pancreatitis or pancreatic duct leak.  -Per GI note: discussed TPN vs NJ tube feeds. Continue clear liquids for now. Patient may need drainage of pseudocyst- IR consulted. If patient has not improved, patient may need to be [facility]   Increased anion gap metabolic acidosis -Likely related to dehydration -Continue to monitor BMP  Dehydration, moderate -Continue IV fluids  Hypertension -Continue atenolol 25mg  daily (reduced from home dose 50 mg)   Hyperlipidemia -Statin currently held  Thrombocytopenia -Upon admission, platelets were 192, dropped to 126 (likely dilutional component) -Currently 185 -Will continue to monitor CBC  Hypokalemia -replace and monitor  DVT Prophylaxis  Lovenox  Code Status: Full  Family Communication: husband at bedside  Disposition Plan: Admitted. Pending further recommendations per GI.  Consultants Gastroenterology  Procedures  None  Antibiotics   Anti-infectives    None      Subjective:   Felicia PieriniBarbara Demarcus seen and examined today.  Continues to complain of nausea. Feels pain has improved.  Feels that maybe potassium is causing her nausea.  Denies chest pain, shortness of breath, dizziness, headache.    Objective:   Vitals:   01/10/16 2225 01/10/16 2335 01/11/16 0556 01/11/16 0921  BP:  (!) 120/52 126/64 140/82  Pulse:  66 81 78  Resp:  (!) 22 16   Temp: 99.4 F (37.4 C) 98.5 F (36.9 C) 99.5 F (37.5 C)   TempSrc: Oral Oral Oral   SpO2:  99% 93%   Weight:      Height:        Intake/Output Summary (Last 24 hours) at 01/11/16 1128 Last data filed at 01/11/16 1015  Gross per 24 hour  Intake  1466.67 ml  Output                0 ml  Net          1466.67 ml   Filed Weights   01/07/16 0248 01/07/16 0445 01/07/16 0953  Weight: 64.6 kg (142 lb 8 oz) 64.4 kg (142 lb) 63.5 kg (140 lb)    Exam  General: Well developed, well  nourished, NAD  HEENT: NCAT, mucous membranes moist.   Cardiovascular: S1 S2 auscultated, no murmurs, RRR  Respiratory: Clear to auscultation bilaterally with equal chest rise  Abdomen: Soft, nontender, nondistended, + bowel sounds  Extremities: warm dry without cyanosis clubbing or edema  Neuro: AAOx3, nonfocal  Psych: Appropriate mood and affect   Data Reviewed: I have personally reviewed following labs and imaging studies  CBC:  Recent Labs Lab 01/07/16 0300 01/08/16 0730 01/09/16 0514 01/11/16 0607  WBC 8.1 6.0 5.3 8.6  NEUTROABS 6.6  --   --  7.6  HGB 13.4 10.9* 10.7* 11.6*  HCT 39.1 32.7* 31.2* 33.6*  MCV 89.9 91.3 89.9 88.4  PLT 192 126* 135* 185   Basic Metabolic Panel:  Recent Labs Lab 01/07/16 0300 01/07/16 0956 01/08/16 0730 01/09/16 0514 01/10/16 0458 01/11/16 0607  NA 139  --  138 138 136 137  K 3.7  --  3.8 3.6 3.2* 4.0  CL 102  --  108 110 108 107  CO2 20*  --  19* 18* 17* 18*  GLUCOSE 101*  --  69 86 100* 114*  BUN 12  --  <5* <5* <5* 5*  CREATININE 0.94  --  0.79 0.77 0.71 0.69  CALCIUM 10.0  --  8.7* 8.8* 8.7* 9.2  MG  --  1.7  --   --   --   --   PHOS  --  3.9  --   --   --   --    GFR: Estimated Creatinine Clearance: 65.5 mL/min (by C-G formula based on SCr of 0.69 mg/dL). Liver Function Tests:  Recent Labs Lab 01/07/16 0300 01/08/16 0730 01/09/16 0514 01/10/16 0458 01/11/16 0607  AST 28 17 17 17 15   ALT 20 13* 13* 12* 11*  ALKPHOS 60 45 43 44 47  BILITOT 1.5* 1.2 1.4* 1.2 1.0  PROT 7.4 5.4* 5.6* 5.4* 6.2*  ALBUMIN 4.1 3.0* 2.9* 2.6* 2.8*    Recent Labs Lab 01/07/16 0300 01/08/16 0730 01/09/16 0514 01/10/16 0458 01/11/16 0607  LIPASE 1,079* 640* 485* 379* 316*   No results for input(s): AMMONIA in the last 168 hours. Coagulation Profile: No results for input(s): INR, PROTIME in the last 168 hours. Cardiac Enzymes: No results for input(s): CKTOTAL, CKMB, CKMBINDEX, TROPONINI in the last 168 hours. BNP (last 3  results) No results for input(s): PROBNP in the last 8760 hours. HbA1C: No results for input(s): HGBA1C in the last 72 hours. CBG: No results for input(s): GLUCAP in the last 168 hours. Lipid Profile: No results for input(s): CHOL, HDL, LDLCALC, TRIG, CHOLHDL, LDLDIRECT in the last 72 hours. Thyroid Function Tests: No results for input(s): TSH, T4TOTAL, FREET4, T3FREE, THYROIDAB in the last 72 hours. Anemia Panel: No results for input(s): VITAMINB12, FOLATE, FERRITIN, TIBC, IRON, RETICCTPCT in the last 72 hours. Urine analysis:    Component Value Date/Time   COLORURINE YELLOW 12/20/2015 1833   APPEARANCEUR CLEAR 12/20/2015 1833   LABSPEC >1.046 (H) 12/20/2015 1833   PHURINE 6.0 12/20/2015 1833   GLUCOSEU NEGATIVE 12/20/2015 1833   HGBUR NEGATIVE  12/20/2015 1833   BILIRUBINUR NEGATIVE 12/20/2015 1833   KETONESUR 40 (A) 12/20/2015 1833   PROTEINUR NEGATIVE 12/20/2015 1833   NITRITE NEGATIVE 12/20/2015 1833   LEUKOCYTESUR NEGATIVE 12/20/2015 1833   Sepsis Labs: @LABRCNTIP (procalcitonin:4,lacticidven:4)  )No results found for this or any previous visit (from the past 240 hour(s)).    Radiology Studies: Ct Abdomen Pelvis W Contrast  Result Date: 01/10/2016 CLINICAL DATA:  Abdominal pain since Friday. ERCP on Friday. Nausea and vomiting. EXAM: CT ABDOMEN AND PELVIS WITH CONTRAST TECHNIQUE: Multidetector CT imaging of the abdomen and pelvis was performed using the standard protocol following bolus administration of intravenous contrast. CONTRAST:  ISOVUE-300 IOPAMIDOL (ISOVUE-300) INJECTION 61% COMPARISON:  12/20/2015. FINDINGS: Lower chest: The lung bases show no acute findings. Heart size normal. No pericardial or pleural effusion. Hepatobiliary: Sub cm low-attenuation lesion in the left hepatic lobe is too small to characterize. Liver and gallbladder are otherwise unremarkable. No biliary ductal dilatation. Pancreas: Pancreatic ducts remains dilated, 5 mm. Pancreas is otherwise  unremarkable. Spleen: Negative. Adrenals/Urinary Tract: Adrenal glands and kidneys are unremarkable. Ureters are decompressed. Bladder is grossly unremarkable. Stomach/Bowel: There is mass effect on the body of the stomach by a large, somewhat loculated appearing fluid collection in the lesser sac, measuring up to 7.7 x 9.1 cm, new from 12/20/2015. Stomach, small bowel, appendix and colon are otherwise unremarkable. Vascular/Lymphatic: Atherosclerotic calcification of the arterial vasculature without abdominal aortic aneurysm. No pathologically enlarged lymph nodes. Reproductive: Uterus is visualized.  No adnexal mass. Other: Moderate ascites. Large fluid collection appears loculated in the lesser sac, measuring 7.7 x 9.1 cm, as mentioned above. It exerts mass effect on the gastric body. Mild haziness in the omentum, nonspecific. Musculoskeletal: No worrisome lytic or sclerotic lesions. IMPRESSION: 1. Persistent pancreatic ductal dilatation with probable large pseudocyst in the lesser sac, likely related to either pancreatitis or pancreatic duct leak. 2. Moderate ascites. 3.  Aortic atherosclerosis (ICD10-170.0). Electronically Signed   By: Leanna Battles M.D.   On: 01/10/2016 16:25     Scheduled Meds: . atenolol  25 mg Oral Daily  . enoxaparin (LOVENOX) injection  40 mg Subcutaneous Q24H  . ondansetron (ZOFRAN) IV  4 mg Intravenous Q6H   Continuous Infusions: . sodium chloride 75 mL/hr at 01/11/16 0923     LOS: 4 days   Time Spent in minutes   30 minutes  MIKHAIL, MARYANN D.O. on 01/11/2016 at 11:28 AM  Between 7am to 7pm - Pager - [phone]  After 7pm go to [email or web address] - password [email or web address]  And look for the night coverage person covering for me after hours  [facility] Office  [phone]

## 2016-01-11 NOTE — Progress Notes (Signed)
Discussed case [facility]. Who recommends octreatide 100 mcg q 8 to decrease pancreatic secretion and they are happy to take her as a transfer if needed. Will need to discuss route of feeding soon.

## 2016-01-11 NOTE — Progress Notes (Addendum)
Number to Dr. Magda [facility]ielBurbridge's office  at [facility] [phone] given to nurse at Dr. Marlane HatcherMagod's office.

## 2016-01-12 LAB — COMPREHENSIVE METABOLIC PANEL
ALT: 11 U/L — ABNORMAL LOW (ref 14–54)
AST: 19 U/L (ref 15–41)
Albumin: 2.4 g/dL — ABNORMAL LOW (ref 3.5–5.0)
Alkaline Phosphatase: 41 U/L (ref 38–126)
Anion gap: 10 (ref 5–15)
BUN: 10 mg/dL (ref 6–20)
CO2: 22 mmol/L (ref 22–32)
Calcium: 8.8 mg/dL — ABNORMAL LOW (ref 8.9–10.3)
Chloride: 107 mmol/L (ref 101–111)
Creatinine, Ser: 0.76 mg/dL (ref 0.44–1.00)
GFR calc Af Amer: >60 mL/min (ref >60)
GFR calc non Af Amer: >60 mL/min (ref >60)
Glucose, Bld: 128 mg/dL — ABNORMAL HIGH (ref 65–99)
Potassium: 4.6 mmol/L (ref 3.5–5.1)
Sodium: 139 mmol/L (ref 135–145)
Total Bilirubin: 1.3 mg/dL — ABNORMAL HIGH (ref 0.3–1.2)
Total Protein: 5.5 g/dL — ABNORMAL LOW (ref 6.5–8.1)

## 2016-01-12 NOTE — Progress Notes (Signed)
PROGRESS NOTE    Felicia PieriniBarbara Vinsant  XBJ:[id number] DOB: 1956/02/21 DOA: 01/07/2016 PCP: Mickie HillierLITTLE,KEVIN LORNE, MD   Chief Complaint  Patient presents with  . Abdominal Pain    Brief Narrative:  HPI on 01/07/2016 by Ms. Junious SilkAllison Ellis, NP Felicia PieriniBarbara Obrien is a 60 y.o. 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 She was recently hospitalized in October after [facility] and experiencing post pancreatitis ERCP. CT scan done at that time revealed slight swelling of the pancreas without abscess or pseudocyst. Her initial lipase was 312 and peaked at 2359. Patient was discharged after [facility] day stay [facility] at [facility]. [facility] has [facility] Unfortunately, as has occurred with previous attempts, they were unable to perform sphincterotomy. Since the procedure she has only ingested either water or ginger ale. Yesterday evening she attempted a Popsicle and developed significant abdominal pain followed by nausea and vomiting. She has not had any diarrhea. She reports she no longer takes pancreatic enzymes since [facility] stated they would not be helpful given the etiology of her pancreatitis. She continues to have significant abdominal pain. Of note she has upper lip edema that she reports was related to unintentional trauma during endoscopy on 11/10. Assessment & Plan   Recurrent, acute Pancreatitis s/p ERCP  -Patient with history of pancreatitis post prior ERCPthat has responded to symptom management, h/o pancreas divisum -Currently on full liquid diet, but continues to have nausea and vomiting  -Continue antiemetics PRN and scheduled -Gastroenterology consulted and appreciated  -Scheduled Zofran and prnPhenergan to treat nausea and vomiting (Discontinued morphine PCA  11/12) -Lipase trending downward  -CT abdomen pelvis with contrast: Persistent pancreatic ductal dilatation with  probable large pseudocyst in the lesser sac, likely related to either pancreatitis or pancreatic duct leak.  -Per GI note: discussed TPN vs [place] tube feeds. Continue clear liquids for now. Patient may need drainage of pseudocyst- IR consulted. If patient has not improved, [facility] Octreotide started and seems to be helping.   Increased anion gap metabolic acidosis -Resolved, Likely related to dehydration -Continue to monitor BMP  Dehydration, moderate -Continue IV fluids   Hypertension -Continue atenolol 25mg  daily (reduced from home dose 50 mg)   Hyperlipidemia -Statin currently held  Thrombocytopenia -Upon admission, platelets were 192, dropped to 126 (likely dilutional component) -Currently 185 -Will continue to monitor CBC  Hypokalemia -replace and monitor  DVT Prophylaxis  Lovenox  Code Status: Full  Family Communication: Sister at bedside  Disposition Plan: Admitted. Pending further recommendations per GI.  Consultants Gastroenterology  Procedures  None  Antibiotics   Anti-infectives    None      Subjective:   Felicia Obrien seen and examined today.  Continues to complain of nausea, but feels it has improved. Feels she is able to drink more today. Denies chest pain, abdominal pain, shortness of breath, dizziness, headache.    Objective:   Vitals:   01/11/16 1528 01/11/16 2149 01/12/16 0629 01/12/16 1105  BP: (!) 124/52 128/60 125/63 (!) 128/56  Pulse: 64 (!) 57 (!) 58 63  Resp:  18    Temp: 99.5 F (37.5 C) 99 F (37.2 C) 98.6 F (37 C)   TempSrc: Oral Oral Oral   SpO2: 99% 98% 99%   Weight:      Height:        Intake/Output Summary (Last 24 hours) at 01/12/16 1400 Last data  filed at 01/12/16 1033  Gross per 24 hour  Intake          1996.25 ml  Output             1700 ml  Net           296.25 ml   Filed Weights   01/07/16 0248 01/07/16 0445 01/07/16 0953  Weight: 64.6 kg (142 lb 8 oz) 64.4 kg  (142 lb) 63.5 kg (140 lb)    Exam  General: Well developed, well nourished, NAD  HEENT: NCAT, mucous membranes moist.   Cardiovascular: S1 S2 auscultated, no murmurs, RRR  Respiratory: Clear to auscultation bilaterally with equal chest rise  Abdomen: Soft, nontender, nondistended, + bowel sounds  Extremities: warm dry without cyanosis clubbing or edema  Neuro: AAOx3, nonfocal  Psych: Appropriate mood and affect, pleasant   Data Reviewed: I have personally reviewed following labs and imaging studies  CBC:  Recent Labs Lab 01/07/16 0300 01/08/16 0730 01/09/16 0514 01/11/16 0607  WBC 8.1 6.0 5.3 8.6  NEUTROABS 6.6  --   --  7.6  HGB 13.4 10.9* 10.7* 11.6*  HCT 39.1 32.7* 31.2* 33.6*  MCV 89.9 91.3 89.9 88.4  PLT 192 126* 135* 185   Basic Metabolic Panel:  Recent Labs Lab 01/07/16 0956 01/08/16 0730 01/09/16 0514 01/10/16 0458 01/11/16 0607 01/12/16 0729  NA  --  138 138 136 137 139  K  --  3.8 3.6 3.2* 4.0 4.6  CL  --  108 110 108 107 107  CO2  --  19* 18* 17* 18* 22  GLUCOSE  --  69 86 100* 114* 128*  BUN  --  <5* <5* <5* 5* 10  CREATININE  --  0.79 0.77 0.71 0.69 0.76  CALCIUM  --  8.7* 8.8* 8.7* 9.2 8.8*  MG 1.7  --   --   --   --   --   PHOS 3.9  --   --   --   --   --    GFR: Estimated Creatinine Clearance: 65.5 mL/min (by C-G formula based on SCr of 0.76 mg/dL). Liver Function Tests:  Recent Labs Lab 01/08/16 0730 01/09/16 0514 01/10/16 0458 01/11/16 0607 01/12/16 0729  AST 17 17 17 15 19   ALT 13* 13* 12* 11* 11*  ALKPHOS 45 43 44 47 41  BILITOT 1.2 1.4* 1.2 1.0 1.3*  PROT 5.4* 5.6* 5.4* 6.2* 5.5*  ALBUMIN 3.0* 2.9* 2.6* 2.8* 2.4*    Recent Labs Lab 01/08/16 0730 01/09/16 0514 01/10/16 0458 01/11/16 0607 01/11/16 1307  LIPASE 640* 485* 379* 316* 333*   No results for input(s): AMMONIA in the last 168 hours. Coagulation Profile: No results for input(s): INR, PROTIME in the last 168 hours. Cardiac Enzymes: No results for  input(s): CKTOTAL, CKMB, CKMBINDEX, TROPONINI in the last 168 hours. BNP (last 3 results) No results for input(s): PROBNP in the last 8760 hours. HbA1C: No results for input(s): HGBA1C in the last 72 hours. CBG: No results for input(s): GLUCAP in the last 168 hours. Lipid Profile: No results for input(s): CHOL, HDL, LDLCALC, TRIG, CHOLHDL, LDLDIRECT in the last 72 hours. Thyroid Function Tests: No results for input(s): TSH, T4TOTAL, FREET4, T3FREE, THYROIDAB in the last 72 hours. Anemia Panel: No results for input(s): VITAMINB12, FOLATE, FERRITIN, TIBC, IRON, RETICCTPCT in the last 72 hours. Urine analysis:    Component Value Date/Time   COLORURINE YELLOW 12/20/2015 1833   APPEARANCEUR CLEAR 12/20/2015 1833   LABSPEC >1.046 (  H) 12/20/2015 1833   PHURINE 6.0 12/20/2015 1833   GLUCOSEU NEGATIVE 12/20/2015 1833   HGBUR NEGATIVE 12/20/2015 1833   BILIRUBINUR NEGATIVE 12/20/2015 1833   KETONESUR 40 (A) 12/20/2015 1833   PROTEINUR NEGATIVE 12/20/2015 1833   NITRITE NEGATIVE 12/20/2015 1833   LEUKOCYTESUR NEGATIVE 12/20/2015 1833   Sepsis Labs: @LABRCNTIP (procalcitonin:4,lacticidven:4)  )No results found for this or any previous visit (from the past 240 hour(s)).    Radiology Studies: Ct Abdomen Pelvis W Contrast  Result Date: 01/10/2016 CLINICAL DATA:  Abdominal pain since Friday. ERCP on Friday. Nausea and vomiting. EXAM: CT ABDOMEN AND PELVIS WITH CONTRAST TECHNIQUE: Multidetector CT imaging of the abdomen and pelvis was performed using the standard protocol following bolus administration of intravenous contrast. CONTRAST:  ISOVUE-300 IOPAMIDOL (ISOVUE-300) INJECTION 61% COMPARISON:  12/20/2015. FINDINGS: Lower chest: The lung bases show no acute findings. Heart size normal. No pericardial or pleural effusion. Hepatobiliary: Sub cm low-attenuation lesion in the left hepatic lobe is too small to characterize. Liver and gallbladder are otherwise unremarkable. No biliary ductal  dilatation. Pancreas: Pancreatic ducts remains dilated, 5 mm. Pancreas is otherwise unremarkable. Spleen: Negative. Adrenals/Urinary Tract: Adrenal glands and kidneys are unremarkable. Ureters are decompressed. Bladder is grossly unremarkable. Stomach/Bowel: There is mass effect on the body of the stomach by a large, somewhat loculated appearing fluid collection in the lesser sac, measuring up to 7.7 x 9.1 cm, new from 12/20/2015. Stomach, small bowel, appendix and colon are otherwise unremarkable. Vascular/Lymphatic: Atherosclerotic calcification of the arterial vasculature without abdominal aortic aneurysm. No pathologically enlarged lymph nodes. Reproductive: Uterus is visualized.  No adnexal mass. Other: Moderate ascites. Large fluid collection appears loculated in the lesser sac, measuring 7.7 x 9.1 cm, as mentioned above. It exerts mass effect on the gastric body. Mild haziness in the omentum, nonspecific. Musculoskeletal: No worrisome lytic or sclerotic lesions. IMPRESSION: 1. Persistent pancreatic ductal dilatation with probable large pseudocyst in the lesser sac, likely related to either pancreatitis or pancreatic duct leak. 2. Moderate ascites. 3.  Aortic atherosclerosis (ICD10-170.0). Electronically Signed   By: Leanna Battles M.D.   On: 01/10/2016 16:25     Scheduled Meds: . atenolol  25 mg Oral Daily  . enoxaparin (LOVENOX) injection  40 mg Subcutaneous Q24H  . octreotide  100 mcg Intravenous Q8H  . ondansetron (ZOFRAN) IV  4 mg Intravenous Q6H   Continuous Infusions: . sodium chloride Stopped (01/12/16 1243)     LOS: 5 days   Time Spent in minutes   30 minutes  MIKHAIL, MARYANN D.O. on 01/12/2016 at 2:00 PM  Between 7am to 7pm - Pager - [phone]  After 7pm go to [email or web address] - password [email or web address]  And look for the night coverage person covering for me after hours  [facility] Office  [phone]

## 2016-01-12 NOTE — Progress Notes (Signed)
Britta MccreedyBarbara Washington 11:09 AM  Subjective: Patient is doing significantly better than yesterday and we rediscussed the CT findings with her and her daughter and I discussed my phone call with her [facility] biliary endoscopist and she believes the octreotide has helped a lot and she does not have any pain and she is back to walking and is tolerating clear liquids and only gets a little nauseated with her octreotide injection and she has no new complaints and she did move her bowels yesterday  Objective: Vital signs stable afebrile no acute distress lungs are clear heart regular rate and rhythm abdomen is soft no succussion splash occasional bowel sounds chemistries okay  Assessment: Pancreatitis with fluid collection seemingly helped with octreotide  Plan: We will allow clear liquids and will make a decision later this weekend whether we try to advance her diet or proceed with TPN or tube feeds and will probably if she continues to improve have to contact and prior to discharge regarding a possible injection of longer acting octreotide and to set up a follow-up as well and they will let me know or the hospital team if they feel they would like to be transferred there  [facility] Center[facility],MARC E  Pager [phone] After 5PM or if no answer call [phone]

## 2016-01-13 LAB — BASIC METABOLIC PANEL
Anion gap: 7 (ref 5–15)
BUN: 8 mg/dL (ref 6–20)
CO2: 26 mmol/L (ref 22–32)
Calcium: 8.4 mg/dL — ABNORMAL LOW (ref 8.9–10.3)
Chloride: 106 mmol/L (ref 101–111)
Creatinine, Ser: 0.67 mg/dL (ref 0.44–1.00)
GFR calc Af Amer: >60 mL/min (ref >60)
GFR calc non Af Amer: >60 mL/min (ref >60)
Glucose, Bld: 113 mg/dL — ABNORMAL HIGH (ref 65–99)
Potassium: 3 mmol/L — ABNORMAL LOW (ref 3.5–5.1)
Sodium: 139 mmol/L (ref 135–145)

## 2016-01-13 LAB — CBC
HCT: 27.7 % — ABNORMAL LOW (ref 36.0–46.0)
Hemoglobin: 9.6 g/dL — ABNORMAL LOW (ref 12.0–15.0)
MCH: 30.7 pg (ref 26.0–34.0)
MCHC: 34.7 g/dL (ref 30.0–36.0)
MCV: 88.5 fL (ref 78.0–100.0)
Platelets: 182 10*3/uL (ref 150–400)
RBC: 3.13 MIL/uL — ABNORMAL LOW (ref 3.87–5.11)
RDW: 13 % (ref 11.5–15.5)
WBC: 4.8 10*3/uL (ref 4.0–10.5)

## 2016-01-13 LAB — LIPASE, BLOOD: Lipase: 159 U/L — ABNORMAL HIGH (ref 11–51)

## 2016-01-13 MED ORDER — SODIUM CHLORIDE 0.9 % IV SOLN
100.0000 mL/h | INTRAVENOUS | Status: AC
  Administered 2016-01-13: 100 mL/h via INTRAVENOUS
  Filled 2016-01-13: qty 1000

## 2016-01-13 MED ORDER — BOOST / RESOURCE BREEZE PO LIQD: 1.0000 | ORAL | Status: DC | PRN

## 2016-01-13 MED ORDER — SODIUM CHLORIDE 0.9 % IV SOLN
INTRAVENOUS | Status: DC
  Administered 2016-01-13 – 2016-01-14 (×2): via INTRAVENOUS
  Administered 2016-01-15: 1000 mL via INTRAVENOUS

## 2016-01-13 NOTE — Progress Notes (Signed)
Initial Nutrition Assessment  INTERVENTION:   Reviewed low fat and high protein diet Assisted pt with ordering meal Offer Boost Breeze po PRN. (each supplement provides 250 kcal and 9 grams of protein)   NUTRITION DIAGNOSIS:   Inadequate oral intake related to altered GI function as evidenced by meal completion < 50%.  GOAL:   Patient will meet greater than or equal to 90% of their needs  MONITOR:   PO intake, I & O's, Labs  REASON FOR ASSESSMENT:   Consult Diet education (Pancreatic Healthy diet)  ASSESSMENT:   Pt with PMH significant for recurrent pancreatitis since her 420s. She was recently hospitalized in October after [facility] and experiencing post pancreatitis ERCP. CT scan done at that time revealedslight swelling of the pancreas without abscess or pseudocyst. Her initial lipase was 312 and peaked at 2359. Patient was discharged [facility]She has subsequently [facility] Unfortunately,as has occurred with previous attempts,they were unable to perform sphincterotomy. Since the procedure she has only ingested either water or ginger ale. She reports she no longer takes pancreatic enzymes since [facility] stated they would not be helpful given the etiology of her pancreatitis.    Pt reports that she has had abd pain for around 1 year but did not effect her intake. She then learned that she had pancreas issues in Aug and put herself on a very low fat diet and lost down from 158 lb to 140 lb from this effort. She has had a good appetite. She has not had any real nutrition x 8 days.  Starting soft/low fat diet.  Medications reviewed and include: octreotide Labs reviewed: K+3.0, Lipase 159 Nutrition-Focused physical exam completed. Findings are no fat depletion, no muscle depletion, and no edema.       Diet Order:  DIET SOFT Room service appropriate? Yes; Fluid consistency:  Thin  Skin:  Reviewed, no issues  Last BM:  11/18  Height:   Ht Readings from Last 1 Encounters:  01/07/16 5\' 2"  (1.575 m)    Weight:   Wt Readings from Last 1 Encounters:  01/07/16 140 lb (63.5 kg)    Ideal Body Weight:  50 kg  BMI:  Body mass index is 25.61 kg/m.  Estimated Nutritional Needs:   Kcal:  1700-1900  Protein:  90-100 grams  Fluid:  > 1.7 L/day  EDUCATION NEEDS:   Education needs addressed  Kendell BaneHeather Pitts RD, LDN, CNSC [phone] Pager [phone] After Hours Pager

## 2016-01-13 NOTE — Progress Notes (Signed)
Called pharmacy regarding sandostatin.  Vial states "for Subcutaneoous injection". It was reported and has been given Intraveneoous. Clarified. Spoke to [place], and she stated it can be given IV.

## 2016-01-13 NOTE — Progress Notes (Signed)
Felicia PieriniBarbara Obrien 9:21 AM  Subjective: Patient doing even better than yesterday and we had a long conversation with her and her husband and answered all their questions about pancreas surgery divisium etc. and she has been walking and wants to eat and is moving her bowels and has no pain except for when she turns a certain way on the left side and she only gets a little nauseated with her medicine Objective: Vital signs stable afebrile patient looks well lungs are clear heart regular rate and rhythm abdomen is soft nontender no succussion splash occasional bowel sounds decreased hemoglobin and white count okay decreased potassium  Assessment: Pancreatitis  Plan: Will try low-fat soft solids can return to clear liquids when necessary might consider retrying pancreatic enzymes which we discussed and might lower octreotide dose tomorrow and discuss  long-acting octreotide with [facility] on Monday and will check on tomorrow  [facility],MARC E  Pager [phone] After 5PM or if no answer call [phone]

## 2016-01-13 NOTE — Plan of Care (Signed)
Problem: Education: Goal: Knowledge of Pancreatitis treatment and prevention will improve Outcome: Progressing Pt given different medication - 1st time medication dosage teaching provided . Pt states she feels relief

## 2016-01-13 NOTE — Progress Notes (Signed)
PROGRESS NOTE    Felicia Obrien  [id number] DOB: September 03, 1955 DOA: 01/07/2016 PCP: Mickie HillierLITTLE,KEVIN LORNE, MD   Chief Complaint  Patient presents with  . Abdominal Pain    Brief Narrative:  HPI on 01/07/2016 by Ms. Junious SilkAllison Ellis, NP Felicia Obrien is a 60 y.o. female with medical history significant for recurrent pancreatitis since her 3620s. She was recently hospitalized in October after [facility] and experiencing post pancreatitis ERCP. CT scan done at that time revealed slight swelling of the pancreas without abscess or pseudocyst. Her initial lipase was 312 and peaked at 2359. Patient was [facility] She has subsequently [facility] Unfortunately, as has occurred with previous attempts, they were unable to perform sphincterotomy. Since the procedure she has only ingested either water or ginger ale. Yesterday evening she attempted a Popsicle and developed significant abdominal pain followed by nausea and vomiting. She has not had any diarrhea. She reports she no longer takes pancreatic enzymes since [facility] stated they would not be helpful given the etiology of her pancreatitis. She continues to have significant abdominal pain. Of note she has upper lip edema that she reports was related to unintentional trauma during endoscopy on 11/10. Assessment & Plan   Recurrent, acute Pancreatitis s/p ERCP  -Patient with history of pancreatitis post prior ERCPthat has responded to symptom management, h/o pancreas divisum -Currently on full liquid diet, but continues to have nausea and vomiting  -Continue antiemetics PRN and scheduled -Gastroenterology consulted and appreciated  -Scheduled Zofran and prnPhenergan to treat nausea and vomiting (Discontinued morphine PCA  11/12) -Lipase trending downward, today 159 -CT abdomen pelvis with contrast: Persistent pancreatic ductal dilatation  with probable large pseudocyst in the lesser sac, likely related to either pancreatitis or pancreatic duct leak.  -Per GI note: discussed TPN vs [place] tube feeds. Continue clear liquids for now. Patient may need drainage of pseudocyst- IR consulted. If patient has not improved, [facility]. Octreotide started and seems to be helping. -Transitioned to soft diet today -Dr. Ewing SchleinMagod will speak with [facility] on Monday regarding longer acting dose of Octreotide  Increased anion gap metabolic acidosis -Resolved, Likely related to dehydration -Continue to monitor BMP  Dehydration, moderate -Continue IV fluids   Hypertension -Continue atenolol 25mg  daily (reduced from home dose 50 mg)   Hyperlipidemia -Statin currently held  Thrombocytopenia -Upon admission, platelets were 192, dropped to 126 (likely dilutional component) -Currently 182 -Will continue to monitor CBC  Hypokalemia -replace and monitor  Anemia -hemoglobin currently 9.6 (drop possibly dilutional as patient has been receivng IVF) -Continue to monitor CBC  DVT Prophylaxis  Lovenox  Code Status: Full  Family Communication: Husband at bedside  Disposition Plan: Admitted. Pending further recommendations per GI.  Consultants Gastroenterology  Procedures  None  Antibiotics   Anti-infectives    None      Subjective:   Felicia Obrien seen and examined today.  Feeling much better today.  Feels hungry.  Ready to try a soft diet.  Has some nausea with octreotide.  Denies abdominal pain.  Denies chest pain, abdominal pain, shortness of breath, dizziness, headache.    Objective:   Vitals:   01/12/16 1417 01/12/16 2109 01/13/16 0505 01/13/16 0922  BP: 128/64 128/61 (!) 120/47 126/61  Pulse: 60 (!) 57 (!) 47 60  Resp:  19 19   Temp: 98.7 F (37.1 C) 98.8 F (37.1  C) 98.2 F (36.8 C)   TempSrc: Oral Oral Oral   SpO2: 97% 94% 95%   Weight:      Height:        Intake/Output  Summary (Last 24 hours) at 01/13/16 1044 Last data filed at 01/13/16 0600  Gross per 24 hour  Intake          2281.25 ml  Output              300 ml  Net          1981.25 ml   Filed Weights   01/07/16 0248 01/07/16 0445 01/07/16 0953  Weight: 64.6 kg (142 lb 8 oz) 64.4 kg (142 lb) 63.5 kg (140 lb)    Exam  General: Well developed, well nourished, NAD  HEENT: NCAT, mucous membranes moist.   Cardiovascular: S1 S2 auscultated, no murmurs, RRR  Respiratory: Clear to auscultation bilaterally with equal chest rise  Abdomen: Soft, nontender, nondistended, + bowel sounds  Extremities: warm dry without cyanosis clubbing or edema  Neuro: AAOx3, nonfocal  Psych: Appropriate mood and affect, pleasant   Data Reviewed: I have personally reviewed following labs and imaging studies  CBC:  Recent Labs Lab 01/07/16 0300 01/08/16 0730 01/09/16 0514 01/11/16 0607 01/13/16 0539  WBC 8.1 6.0 5.3 8.6 4.8  NEUTROABS 6.6  --   --  7.6  --   HGB 13.4 10.9* 10.7* 11.6* 9.6*  HCT 39.1 32.7* 31.2* 33.6* 27.7*  MCV 89.9 91.3 89.9 88.4 88.5  PLT 192 126* 135* 185 182   Basic Metabolic Panel:  Recent Labs Lab 01/07/16 0956  01/09/16 0514 01/10/16 0458 01/11/16 0607 01/12/16 0729 01/13/16 0539  NA  --   < > 138 136 137 139 139  K  --   < > 3.6 3.2* 4.0 4.6 3.0*  CL  --   < > 110 108 107 107 106  CO2  --   < > 18* 17* 18* 22 26  GLUCOSE  --   < > 86 100* 114* 128* 113*  BUN  --   < > <5* <5* 5* 10 8  CREATININE  --   < > 0.77 0.71 0.69 0.76 0.67  CALCIUM  --   < > 8.8* 8.7* 9.2 8.8* 8.4*  MG 1.7  --   --   --   --   --   --   PHOS 3.9  --   --   --   --   --   --   < > = values in this interval not displayed. GFR: Estimated Creatinine Clearance: 65.5 mL/min (by C-G formula based on SCr of 0.67 mg/dL). Liver Function Tests:  Recent Labs Lab 01/08/16 0730 01/09/16 0514 01/10/16 0458 01/11/16 0607 01/12/16 0729  AST 17 17 17 15 19   ALT 13* 13* 12* 11* 11*  ALKPHOS 45 43  44 47 41  BILITOT 1.2 1.4* 1.2 1.0 1.3*  PROT 5.4* 5.6* 5.4* 6.2* 5.5*  ALBUMIN 3.0* 2.9* 2.6* 2.8* 2.4*    Recent Labs Lab 01/09/16 0514 01/10/16 0458 01/11/16 0607 01/11/16 1307 01/13/16 0539  LIPASE 485* 379* 316* 333* 159*   No results for input(s): AMMONIA in the last 168 hours. Coagulation Profile: No results for input(s): INR, PROTIME in the last 168 hours. Cardiac Enzymes: No results for input(s): CKTOTAL, CKMB, CKMBINDEX, TROPONINI in the last 168 hours. BNP (last 3 results) No results for input(s): PROBNP in the last 8760 hours. HbA1C: No results for input(s):  HGBA1C in the last 72 hours. CBG: No results for input(s): GLUCAP in the last 168 hours. Lipid Profile: No results for input(s): CHOL, HDL, LDLCALC, TRIG, CHOLHDL, LDLDIRECT in the last 72 hours. Thyroid Function Tests: No results for input(s): TSH, T4TOTAL, FREET4, T3FREE, THYROIDAB in the last 72 hours. Anemia Panel: No results for input(s): VITAMINB12, FOLATE, FERRITIN, TIBC, IRON, RETICCTPCT in the last 72 hours. Urine analysis:    Component Value Date/Time   COLORURINE YELLOW 12/20/2015 1833   APPEARANCEUR CLEAR 12/20/2015 1833   LABSPEC >1.046 (H) 12/20/2015 1833   PHURINE 6.0 12/20/2015 1833   GLUCOSEU NEGATIVE 12/20/2015 1833   HGBUR NEGATIVE 12/20/2015 1833   BILIRUBINUR NEGATIVE 12/20/2015 1833   KETONESUR 40 (A) 12/20/2015 1833   PROTEINUR NEGATIVE 12/20/2015 1833   NITRITE NEGATIVE 12/20/2015 1833   LEUKOCYTESUR NEGATIVE 12/20/2015 1833   Sepsis Labs: @LABRCNTIP (procalcitonin:4,lacticidven:4)  )No results found for this or any previous visit (from the past 240 hour(s)).    Radiology Studies: No results found.   Scheduled Meds: . atenolol  25 mg Oral Daily  . enoxaparin (LOVENOX) injection  40 mg Subcutaneous Q24H  . octreotide  100 mcg Intravenous Q8H  . ondansetron (ZOFRAN) IV  4 mg Intravenous Q6H   Continuous Infusions: . sodium chloride 50 mL/hr at 01/13/16 0919  .  sodium chloride 0.9% 1,000 mL w/ potassium chloride 80 mEq infusion - CAUTION DOSE 100 mL/hr (01/13/16 0852)     LOS: 6 days   Time Spent in minutes   30 minutes  MIKHAIL, MARYANN D.O. on 01/13/2016 at 10:44 AM  Between 7am to 7pm - Pager - [phone]  After 7pm go to [email or web address] - password [email or web address]  And look for the night coverage person covering for me after hours  [facility] Office  [phone]

## 2016-01-14 LAB — CBC WITH DIFFERENTIAL/PLATELET
Basophils Absolute: 0 10*3/uL (ref 0.0–0.1)
Basophils Relative: 0 %
Eosinophils Absolute: 0.2 10*3/uL (ref 0.0–0.7)
Eosinophils Relative: 4 %
HCT: 30.7 % — ABNORMAL LOW (ref 36.0–46.0)
Hemoglobin: 10.4 g/dL — ABNORMAL LOW (ref 12.0–15.0)
Lymphocytes Relative: 22 %
Lymphs Abs: 1 10*3/uL (ref 0.7–4.0)
MCH: 30.2 pg (ref 26.0–34.0)
MCHC: 33.9 g/dL (ref 30.0–36.0)
MCV: 89.2 fL (ref 78.0–100.0)
Monocytes Absolute: 0.3 10*3/uL (ref 0.1–1.0)
Monocytes Relative: 7 %
Neutro Abs: 2.9 10*3/uL (ref 1.7–7.7)
Neutrophils Relative %: 67 %
Platelets: 200 10*3/uL (ref 150–400)
RBC: 3.44 MIL/uL — ABNORMAL LOW (ref 3.87–5.11)
RDW: 12.9 % (ref 11.5–15.5)
WBC: 4.5 10*3/uL (ref 4.0–10.5)

## 2016-01-14 LAB — BASIC METABOLIC PANEL
Anion gap: 7 (ref 5–15)
BUN: <5 mg/dL — ABNORMAL LOW (ref 6–20)
CO2: 31 mmol/L (ref 22–32)
Calcium: 8.7 mg/dL — ABNORMAL LOW (ref 8.9–10.3)
Chloride: 100 mmol/L — ABNORMAL LOW (ref 101–111)
Creatinine, Ser: 0.7 mg/dL (ref 0.44–1.00)
GFR calc Af Amer: >60 mL/min (ref >60)
GFR calc non Af Amer: >60 mL/min (ref >60)
Glucose, Bld: 138 mg/dL — ABNORMAL HIGH (ref 65–99)
Potassium: 3.7 mmol/L (ref 3.5–5.1)
Sodium: 138 mmol/L (ref 135–145)

## 2016-01-14 MED ORDER — OCTREOTIDE ACETATE 100 MCG/ML IJ SOLN
100.0000 ug | Freq: Two times a day (BID) | INTRAMUSCULAR | Status: DC
  Administered 2016-01-14 – 2016-01-15 (×2): 100 ug via INTRAVENOUS
  Filled 2016-01-14 (×2): qty 1

## 2016-01-14 NOTE — Progress Notes (Signed)
Donn PieriniBarbara Filsinger 10:37 AM  Subjective: Patient continues to do well without any pain or nausea or vomiting and is moving her bowels and again her case was discussed with her and her husband including diet and pancreatic enzymes and we answered all of their questions  Objective: Vital signs stable afebrile no acute distress abdomen is soft nontender labs stable hemoglobin increased  Assessment: Pancreatitis  Plan: Probably can go home tomorrow we will decrease octreotide dose to every 12 and will ask one of my call partners to call [facility] to see if they recommend Im long-acting octreotide and will leave samples of pancreatic enzymes at our office to try and okay to hold off on repeat imaging for now [facility] Hospital[facility],MARC E  Pager [phone] After 5PM or if no answer call [phone]

## 2016-01-14 NOTE — Progress Notes (Signed)
PROGRESS NOTE    Felicia Obrien  LKG:[id number] DOB: 06/01/1955 DOA: 01/07/2016 PCP: Mickie HillierLITTLE,KEVIN LORNE, MD   Chief Complaint  Patient presents with  . Abdominal Pain    Brief Narrative:  HPI on 01/07/2016 by Ms. Felicia SilkAllison Ellis, NP Felicia PieriniBarbara Obrien is a 60 y.o. female with medical history significant for recurrent pancreatitis since her 6920s. She was recently hospitalized in October after [facility] and experiencing post pancreatitis ERCP. CT scan done at that time revealed slight swelling of the pancreas without abscess or pseudocyst. Her initial lipase was 312 and peaked at 2359. Patient was discharged after 4 day stay [facility]. She has subsequently [facility]. Unfortunately, as has occurred with previous attempts, they were unable to perform sphincterotomy. Since the procedure she has only ingested either water or ginger ale. Yesterday evening she attempted a Popsicle and developed significant abdominal pain followed by nausea and vomiting. She has not had any diarrhea. She reports she no longer takes pancreatic enzymes since her [facility] stated they would not be helpful given the etiology of her pancreatitis. She continues to have significant abdominal pain. Of note she has upper lip edema that she reports was related to unintentional trauma during endoscopy on 11/10. Assessment & Plan   Recurrent, acute Pancreatitis s/p ERCP  -Patient with history of pancreatitis post prior ERCPthat has responded to symptom management, h/o pancreas divisum -Currently on full liquid diet, but continues to have nausea and vomiting  -Continue antiemetics PRN and scheduled -Gastroenterology consulted and appreciated  -Scheduled Zofran and prnPhenergan to treat nausea and vomiting (Discontinued morphine PCA  11/12) -Lipase trending downward, today 159 -CT abdomen pelvis with contrast: Persistent pancreatic ductal dilatation  with probable large pseudocyst in the lesser sac, likely related to either pancreatitis or pancreatic duct leak.  -Transitioned to soft diet and tolerating it well -Dr. Ewing SchleinMagod or partner will speak with Felicia Obrien on Monday regarding longer acting dose of Octreotide. -Octreotide decreased. Dr. Ewing SchleinMagod will arrange for samples of pancreatic enzymes.  -Will hold off on repeat imaging for now  Increased anion gap metabolic acidosis -Resolved, Likely related to dehydration -Continue to monitor BMP  Dehydration, moderate -Continue IV fluids   Hypertension -Continue atenolol 25mg  daily (reduced from home dose 50 mg)   Hyperlipidemia -Statin currently held  Thrombocytopenia -Upon admission, platelets were 192, dropped to 126 (likely dilutional component) -Currently 200 -Will continue to monitor CBC  Hypokalemia -replace and monitor  Anemia -hemoglobin currently 10.4 -drop possibly dilutional as patient has been receivng IVF -Continue to monitor CBC  DVT Prophylaxis  Lovenox  Code Status: Full  Family Communication: None at bedside  Disposition Plan: Admitted. Possible d/c to home on 11/20  Consultants Gastroenterology  Procedures  None  Antibiotics   Anti-infectives    None      Subjective:   Felicia PieriniBarbara Strassman seen and examined today.  Feeling much better today and was able to tolerate food yesterday. Denies nausea or abdominal pain today. Denies chest pain, abdominal pain, shortness of breath, dizziness, headache.    Objective:   Vitals:   01/13/16 1420 01/13/16 2220 01/14/16 0536 01/14/16 0808  BP: (!) 157/64 (!) 111/51 130/62 134/68  Pulse: (!) 55 60 70 70  Resp: 18 18 18    Temp: 99.1 F (37.3 C) 99.1 F (37.3 C) 99.4 F (37.4 C)   TempSrc: Oral Oral Oral   SpO2: 100% 100% 100% 98%  Weight:  Height:        Intake/Output Summary (Last 24 hours) at 01/14/16 1143 Last data filed at 01/14/16 0600  Gross per 24 hour  Intake          2170.83 ml    Output             2600 ml  Net          -429.17 ml   Filed Weights   01/07/16 0248 01/07/16 0445 01/07/16 0953  Weight: 64.6 kg (142 lb 8 oz) 64.4 kg (142 lb) 63.5 kg (140 lb)    Exam  General: Well developed, well nourished, no distress  HEENT: NCAT, mucous membranes moist.   Cardiovascular: S1 S2 auscultated, no murmurs, RRR  Respiratory: Clear to auscultation bilaterally with equal chest rise  Abdomen: Soft, nontender, nondistended, + bowel sounds  Extremities: warm dry without cyanosis clubbing or edema  Neuro: AAOx3, nonfocal  Psych: Appropriate mood and affect, pleasant   Data Reviewed: I have personally reviewed following labs and imaging studies  CBC:  Recent Labs Lab 01/08/16 0730 01/09/16 0514 01/11/16 0607 01/13/16 0539 01/14/16 0626  WBC 6.0 5.3 8.6 4.8 4.5  NEUTROABS  --   --  7.6  --  2.9  HGB 10.9* 10.7* 11.6* 9.6* 10.4*  HCT 32.7* 31.2* 33.6* 27.7* 30.7*  MCV 91.3 89.9 88.4 88.5 89.2  PLT 126* 135* 185 182 200   Basic Metabolic Panel:  Recent Labs Lab 01/10/16 0458 01/11/16 0607 01/12/16 0729 01/13/16 0539 01/14/16 0626  NA 136 137 139 139 138  K 3.2* 4.0 4.6 3.0* 3.7  CL 108 107 107 106 100*  CO2 17* 18* 22 26 31   GLUCOSE 100* 114* 128* 113* 138*  BUN <5* 5* 10 8 <5*  CREATININE 0.71 0.69 0.76 0.67 0.70  CALCIUM 8.7* 9.2 8.8* 8.4* 8.7*   GFR: Estimated Creatinine Clearance: 65.5 mL/min (by C-G formula based on SCr of 0.7 mg/dL). Liver Function Tests:  Recent Labs Lab 01/08/16 0730 01/09/16 0514 01/10/16 0458 01/11/16 0607 01/12/16 0729  AST 17 17 17 15 19   ALT 13* 13* 12* 11* 11*  ALKPHOS 45 43 44 47 41  BILITOT 1.2 1.4* 1.2 1.0 1.3*  PROT 5.4* 5.6* 5.4* 6.2* 5.5*  ALBUMIN 3.0* 2.9* 2.6* 2.8* 2.4*    Recent Labs Lab 01/09/16 0514 01/10/16 0458 01/11/16 0607 01/11/16 1307 01/13/16 0539  LIPASE 485* 379* 316* 333* 159*   No results for input(s): AMMONIA in the last 168 hours. Coagulation Profile: No results  for input(s): INR, PROTIME in the last 168 hours. Cardiac Enzymes: No results for input(s): CKTOTAL, CKMB, CKMBINDEX, TROPONINI in the last 168 hours. BNP (last 3 results) No results for input(s): PROBNP in the last 8760 hours. HbA1C: No results for input(s): HGBA1C in the last 72 hours. CBG: No results for input(s): GLUCAP in the last 168 hours. Lipid Profile: No results for input(s): CHOL, HDL, LDLCALC, TRIG, CHOLHDL, LDLDIRECT in the last 72 hours. Thyroid Function Tests: No results for input(s): TSH, T4TOTAL, FREET4, T3FREE, THYROIDAB in the last 72 hours. Anemia Panel: No results for input(s): VITAMINB12, FOLATE, FERRITIN, TIBC, IRON, RETICCTPCT in the last 72 hours. Urine analysis:    Component Value Date/Time   COLORURINE YELLOW 12/20/2015 1833   APPEARANCEUR CLEAR 12/20/2015 1833   LABSPEC >1.046 (H) 12/20/2015 1833   PHURINE 6.0 12/20/2015 1833   GLUCOSEU NEGATIVE 12/20/2015 1833   HGBUR NEGATIVE 12/20/2015 1833   BILIRUBINUR NEGATIVE 12/20/2015 1833   KETONESUR 40 (A) 12/20/2015  1833   PROTEINUR NEGATIVE 12/20/2015 1833   NITRITE NEGATIVE 12/20/2015 1833   LEUKOCYTESUR NEGATIVE 12/20/2015 1833   Sepsis Labs: @LABRCNTIP (procalcitonin:4,lacticidven:4)  )No results found for this or any previous visit (from the past 240 hour(s)).    Radiology Studies: No results found.   Scheduled Meds: . atenolol  25 mg Oral Daily  . enoxaparin (LOVENOX) injection  40 mg Subcutaneous Q24H  . octreotide  100 mcg Intravenous Q12H  . ondansetron (ZOFRAN) IV  4 mg Intravenous Q6H   Continuous Infusions: . sodium chloride 50 mL/hr at 01/14/16 0844     LOS: 7 days   Time Spent in minutes   30 minutes  MIKHAIL, MARYANN D.O. on 01/14/2016 at 11:43 AM  Between 7am to 7pm - Pager - [phone]  After 7pm go to [email or web address] - password [email or web address]  And look for the night coverage person covering for me after hours  [facility] Office  [phone]

## 2016-01-15 LAB — BASIC METABOLIC PANEL
Anion gap: 7 (ref 5–15)
BUN: <5 mg/dL — ABNORMAL LOW (ref 6–20)
CO2: 33 mmol/L — ABNORMAL HIGH (ref 22–32)
Calcium: 9.1 mg/dL (ref 8.9–10.3)
Chloride: 101 mmol/L (ref 101–111)
Creatinine, Ser: 0.72 mg/dL (ref 0.44–1.00)
GFR calc Af Amer: >60 mL/min (ref >60)
GFR calc non Af Amer: >60 mL/min (ref >60)
Glucose, Bld: 116 mg/dL — ABNORMAL HIGH (ref 65–99)
Potassium: 3.8 mmol/L (ref 3.5–5.1)
Sodium: 141 mmol/L (ref 135–145)

## 2016-01-15 NOTE — Progress Notes (Signed)
PROGRESS NOTE    Felicia Obrien Hamberger  ZOX:[id number] DOB: [birth year] DOA: 01/07/2016 PCP: Mickie HillierLITTLE,KEVIN LORNE, MD   Chief Complaint  Patient presents with  . Abdominal Pain    Brief Narrative:  HPI on 01/07/2016 by Ms. Junious SilkAllison Ellis, NP Felicia Obrien Steffler is a 60 y.o. female with medical history significant for recurrent pancreatitis since her 7220s. She was recently hospitalized in October after [facility] and experiencing post pancreatitis ERCP. CT scan done at that time revealed slight swelling of the pancreas without abscess or pseudocyst. Her initial lipase was 312 and peaked at 2359. Patient was discharged after 4 [facility] stay [facility]. She has subsequently [facility]. Unfortunately, as has occurred with previous attempts, they were unable to perform sphincterotomy. Since the procedure she has only ingested either water or ginger ale. Yesterday evening she attempted a Popsicle and developed significant abdominal pain followed by nausea and vomiting. She has not had any diarrhea. She reports she no longer takes pancreatic enzymes since [facility] stated they would not be helpful given the etiology of her pancreatitis. She continues to have significant abdominal pain. Of note she has upper lip edema that she reports was related to unintentional trauma during endoscopy on 11/10. Assessment & Plan   Recurrent, acute Pancreatitis s/p ERCP  -Patient with history of pancreatitis post prior ERCPthat has responded to symptom management, h/o pancreas divisum -Currently on full liquid diet, but continues to have nausea and vomiting  -Continue antiemetics PRN and scheduled -Gastroenterology consulted and appreciated  -Scheduled Zofran and prnPhenergan to treat nausea and vomiting (Discontinued morphine PCA  11/12) -Lipase trending downward, today 159 -CT abdomen pelvis with contrast: Persistent pancreatic ductal dilatation  with probable large pseudocyst in the lesser sac, likely related to either pancreatitis or pancreatic duct leak.  -Transitioned to soft diet and tolerating it well -Dr. Ewing SchleinMagod or partner will speak with [facility] on Monday regarding longer acting dose of Octreotide. -Dr. Ewing SchleinMagod will arrange for samples of pancreatic enzymes.  -Dr. Levora AngelBrahmbhatt spoke with Dr. Shana ChuteBurbridge.  Recommended discontinuing octreotide and observation overnight. If continues to improve, possible d/c 11/21, with outpatient follow up with [facility] in 3 weeks and repeat imaging at that time.  Increased anion gap metabolic acidosis -Resolved, Likely related to dehydration -Continue to monitor BMP  Dehydration, moderate -Continue IV fluids   Hypertension -Continue atenolol 25mg  daily (reduced from home dose 50 mg)   Hyperlipidemia -Statin currently held  Thrombocytopenia -Upon admission, platelets were 192, dropped to 126 (likely dilutional component) -Currently 200 -Will continue to monitor CBC  Hypokalemia -Resolved  Anemia -hemoglobin currently 10.4 -drop possibly dilutional as patient has been receivng IVF -Continue to monitor CBC  DVT Prophylaxis  Lovenox  Code Status: Full  Family Communication: None at bedside  Disposition Plan: Admitted. Possible d/c to home on 11/21  Consultants Gastroenterology  Procedures  None  Antibiotics   Anti-infectives    None      Subjective:   Felicia Obrien Countess seen and examined today.  Feeling much better today but worried about stopping the octreotide.  Currently, denies nausea or abdominal pain , chest pain, shortness of breath, dizziness, headache.    Objective:   Vitals:   01/14/16 1324 01/14/16 2232 01/15/16 0637 01/15/16 0927  BP: 130/71 134/66 130/68 134/69  Pulse: 62 65 65 66  Resp: 17 17 17 16   Temp: 98 F (36.7 C) 98.6 F (37 C) 98.7 F (37.1 C)  TempSrc: Oral Oral Oral   SpO2: 99% 99% 98%   Weight:      Height:        Intake/Output  Summary (Last 24 hours) at 01/15/16 1033 Last data filed at 01/15/16 0758  Gross per 24 hour  Intake                0 ml  Output             2200 ml  Net            -2200 ml   Filed Weights   01/07/16 0248 01/07/16 0445 01/07/16 0953  Weight: 64.6 kg (142 lb 8 oz) 64.4 kg (142 lb) 63.5 kg (140 lb)    Exam  General: Well developed, well nourished, no distress  HEENT: NCAT, mucous membranes moist.   Cardiovascular: S1 S2 auscultated, no murmurs, RRR  Respiratory: Clear to auscultation bilaterally with equal chest rise  Abdomen: Soft, nontender, nondistended, + bowel sounds  Extremities: warm dry without cyanosis clubbing or edema  Neuro: AAOx3, nonfocal  Psych: Appropriate mood and affect, pleasant   Data Reviewed: I have personally reviewed following labs and imaging studies  CBC:  Recent Labs Lab 01/09/16 0514 01/11/16 0607 01/13/16 0539 01/14/16 0626  WBC 5.3 8.6 4.8 4.5  NEUTROABS  --  7.6  --  2.9  HGB 10.7* 11.6* 9.6* 10.4*  HCT 31.2* 33.6* 27.7* 30.7*  MCV 89.9 88.4 88.5 89.2  PLT 135* 185 182 200   Basic Metabolic Panel:  Recent Labs Lab 01/11/16 0607 01/12/16 0729 01/13/16 0539 01/14/16 0626 01/15/16 0440  NA 137 139 139 138 141  K 4.0 4.6 3.0* 3.7 3.8  CL 107 107 106 100* 101  CO2 18* 22 26 31  33*  GLUCOSE 114* 128* 113* 138* 116*  BUN 5* 10 8 <5* <5*  CREATININE 0.69 0.76 0.67 0.70 0.72  CALCIUM 9.2 8.8* 8.4* 8.7* 9.1   GFR: Estimated Creatinine Clearance: 65.5 mL/min (by C-G formula based on SCr of 0.72 mg/dL). Liver Function Tests:  Recent Labs Lab 01/09/16 0514 01/10/16 0458 01/11/16 0607 01/12/16 0729  AST 17 17 15 19   ALT 13* 12* 11* 11*  ALKPHOS 43 44 47 41  BILITOT 1.4* 1.2 1.0 1.3*  PROT 5.6* 5.4* 6.2* 5.5*  ALBUMIN 2.9* 2.6* 2.8* 2.4*    Recent Labs Lab 01/09/16 0514 01/10/16 0458 01/11/16 0607 01/11/16 1307 01/13/16 0539  LIPASE 485* 379* 316* 333* 159*   No results for input(s): AMMONIA in the last 168  hours. Coagulation Profile: No results for input(s): INR, PROTIME in the last 168 hours. Cardiac Enzymes: No results for input(s): CKTOTAL, CKMB, CKMBINDEX, TROPONINI in the last 168 hours. BNP (last 3 results) No results for input(s): PROBNP in the last 8760 hours. HbA1C: No results for input(s): HGBA1C in the last 72 hours. CBG: No results for input(s): GLUCAP in the last 168 hours. Lipid Profile: No results for input(s): CHOL, HDL, LDLCALC, TRIG, CHOLHDL, LDLDIRECT in the last 72 hours. Thyroid Function Tests: No results for input(s): TSH, T4TOTAL, FREET4, T3FREE, THYROIDAB in the last 72 hours. Anemia Panel: No results for input(s): VITAMINB12, FOLATE, FERRITIN, TIBC, IRON, RETICCTPCT in the last 72 hours. Urine analysis:    Component Value Date/Time   COLORURINE YELLOW 12/20/2015 1833   APPEARANCEUR CLEAR 12/20/2015 1833   LABSPEC >1.046 (H) 12/20/2015 1833   PHURINE 6.0 12/20/2015 1833   GLUCOSEU NEGATIVE 12/20/2015 1833   HGBUR NEGATIVE 12/20/2015 1833   BILIRUBINUR NEGATIVE 12/20/2015  1833   KETONESUR 40 (A) 12/20/2015 1833   PROTEINUR NEGATIVE 12/20/2015 1833   NITRITE NEGATIVE 12/20/2015 1833   LEUKOCYTESUR NEGATIVE 12/20/2015 1833   Sepsis Labs: @LABRCNTIP (procalcitonin:4,lacticidven:4)  )No results found for this or any previous visit (from the past 240 hour(s)).    Radiology Studies: No results found.   Scheduled Meds: . atenolol  25 mg Oral Daily  . enoxaparin (LOVENOX) injection  40 mg Subcutaneous Q24H  . ondansetron (ZOFRAN) IV  4 mg Intravenous Q6H   Continuous Infusions: . sodium chloride 1,000 mL (01/15/16 0454)     LOS: 8 days   Time Spent in minutes   30 minutes  MIKHAIL, MARYANN D.O. on 01/15/2016 at 10:33 AM  Between 7am to 7pm - Pager - [phone]  After 7pm go to [email or web address] - password [email or web address]  And look for the night coverage person covering for me after hours  [facility] Office  [phone]

## 2016-01-15 NOTE — Plan of Care (Signed)
Problem: Nutritional: Goal: Ability to achieve adequate nutritional intake will improve Outcome: Progressing Patient knows what dietary modifications are necessary during acute pancreatitis.

## 2016-01-15 NOTE — Plan of Care (Signed)
Problem: Education: Goal: Knowledge of Pancreatitis treatment and prevention will improve Outcome: Progressing Patient verbalizes understanding of disease process and understands medication regimen.

## 2016-01-15 NOTE — Progress Notes (Signed)
[facility] Progress Note  Felicia PieriniBarbara Obrien 60 y.o. 01/04/1956  CC:  Pancreatitis   Subjective:  Patient doing fine. Occasional nausea. Denied vomiting. Abdominal pain resolved. Tolerating soft diet.  ROS : Negative for chest pain shortness of breath, abdominal pain   Objective: Vital signs in last 24 hours: Vitals:   01/14/16 2232 01/15/16 0637  BP: 134/66 130/68  Pulse: 65 65  Resp: 17 17  Temp: 98.6 F (37 C) 98.7 F (37.1 C)    Physical Exam:  General:  Alert, cooperative, no distress, appears stated age  Head:  Normocephalic, without obvious abnormality, atraumatic  Eyes:  , EOM's intact,   Lungs:   Clear to auscultation bilaterally, respirations unlabored  Heart:  Regular rate and rhythm, S1, S2 normal  Abdomen:   Soft, non-tender, bowel sounds active all four quadrants,  no masses,   Extremities: Extremities normal, atraumatic, no  edema       Lab Results:  Recent Labs  01/14/16 0626 01/15/16 0440  NA 138 141  K 3.7 3.8  CL 100* 101  CO2 31 33*  GLUCOSE 138* 116*  BUN <5* <5*  CREATININE 0.70 0.72  CALCIUM 8.7* 9.1   No results for input(s): AST, ALT, ALKPHOS, BILITOT, PROT, ALBUMIN in the last 72 hours.  Recent Labs  01/13/16 0539 01/14/16 0626  WBC 4.8 4.5  NEUTROABS  --  2.9  HGB 9.6* 10.4*  HCT 27.7* 30.7*  MCV 88.5 89.2  PLT 182 200   No results for input(s): LABPROT, INR in the last 72 hours.    Assessment/Plan: - Pancreatitis with large pseudocyst. 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 Shana ChuteBurbridge  this morning. Recommended discontinuing octreotide and observation overnight. - If patient continues to better, probably discharged tomorrow - She needs to be on low-fat diet - [facility] with possible  repeat imaging. - GI will follow.    Kathi DerParag Brahmbhatt MD, FACP 01/15/2016, 8:34 AM  Pager [phone]  If no answer or after 5 PM call [phone]

## 2016-01-16 LAB — BASIC METABOLIC PANEL
Anion gap: 9 (ref 5–15)
BUN: 5 mg/dL — ABNORMAL LOW (ref 6–20)
CO2: 31 mmol/L (ref 22–32)
Calcium: 9.3 mg/dL (ref 8.9–10.3)
Chloride: 102 mmol/L (ref 101–111)
Creatinine, Ser: 0.64 mg/dL (ref 0.44–1.00)
GFR calc Af Amer: >60 mL/min (ref >60)
GFR calc non Af Amer: >60 mL/min (ref >60)
Glucose, Bld: 100 mg/dL — ABNORMAL HIGH (ref 65–99)
Potassium: 4 mmol/L (ref 3.5–5.1)
Sodium: 142 mmol/L (ref 135–145)

## 2016-01-16 MED ORDER — ONDANSETRON 8 MG PO TBDP: 8.0000 mg | ORAL_TABLET | Freq: Three times a day (TID) | ORAL | 0 refills | Status: DC | PRN

## 2016-01-16 MED ORDER — ATENOLOL 25 MG PO TABS: 25.0000 mg | ORAL_TABLET | Freq: Every day | ORAL | Status: DC

## 2016-01-16 NOTE — Care Management Note (Signed)
Case Management Note  Patient Details  Name: Donn PieriniBarbara Galluzzo MRN: [id number] Date of Birth: Aug 16, 1955  Subjective/Objective:                  Patient readmitted from home with husband, with recurrent pancreatitis. [facility].    Action/Plan:  DC to home with spouse, self care.  Expected Discharge Date:                  Expected Discharge Plan:  Home/Self Care  In-House Referral:  NA  Discharge planning Services  CM Consult  Post Acute Care Choice:    Choice offered to:     DME Arranged:    DME Agency:     HH Arranged:    HH Agency:     Status of Service:  Completed, signed off  If discussed at [employer] Length of Stay Meetings, dates discussed:    Additional Comments:  Lawerance SabalDebbie Swist, RN 01/16/2016, 11:09 AM

## 2016-01-16 NOTE — Progress Notes (Signed)
[facility] Progress Note  Felicia Obrien 60 y.o. [birth year]  CC:  Pancreatitis   Subjective:  Patient doing fine. Denied nausea. Denied vomiting. Abdominal pain resolved. Tolerating  diet.  ROS : Negative for chest pain shortness of breath, abdominal pain   Objective: Vital signs in last 24 hours: Vitals:   01/15/16 2311 01/16/16 0632  BP: (!) 107/50 (!) 119/55  Pulse: 64 66  Resp: 16 16  Temp: 98.4 F (36.9 C) 98.4 F (36.9 C)    Physical Exam:  General:  Alert, cooperative, no distress, appears stated age  Head:  Normocephalic, without obvious abnormality, atraumatic  Eyes:  , EOM's intact,   Lungs:   Clear to auscultation bilaterally, respirations unlabored  Heart:  Regular rate and rhythm, S1, S2 normal  Abdomen:   Soft, non-tender, bowel sounds active all four quadrants,  no masses,   Extremities: Extremities normal, atraumatic, no  edema       Lab Results:  Recent Labs  01/15/16 0440 01/16/16 0544  NA 141 142  K 3.8 4.0  CL 101 102  CO2 33* 31  GLUCOSE 116* 100*  BUN <5* 5*  CREATININE 0.72 0.64  CALCIUM 9.1 9.3   No results for input(s): AST, ALT, ALKPHOS, BILITOT, PROT, ALBUMIN in the last 72 hours.  Recent Labs  01/14/16 0626  WBC 4.5  NEUTROABS 2.9  HGB 10.4*  HCT 30.7*  MCV 89.2  PLT 200   No results for input(s): LABPROT, INR in the last 72 hours.    Assessment/Plan: - Pancreatitis with large pseudocyst. 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 - Patient has [facility] end  as well as follow-up with Dr. Shana ChuteBurbridge in early January - Patient was advised to come to the ER if any signs of gastric outlet obstruction such as ongoing nausea, vomiting and abdominal pain. -  Okay to discharge from GI standpoint.   - She needs to be on low-fat diet - She will call our office to  scheduled appointment with Dr. Dulce Sellarutlaw once acute issues are resolved according to her.  - GI will sign off. Call us back if needed.    Kathi DerParag Brahmbhatt MD, FACP 01/16/2016, 8:23 AM  Pager [phone]  If no answer or after 5 PM call [phone]

## 2016-01-16 NOTE — Progress Notes (Signed)
Donn PieriniBarbara Blazina to be D/C'd to home per MD order.  Discussed with the patient and all questions fully answered.  An After Visit Summary was printed and given to the patient. Patient received prescription.  D/c education completed with patient/family including follow up instructions, medication list, d/c activities limitations if indicated, with other d/c instructions as indicated by MD - patient able to verbalize understanding, all questions fully answered.   Patient instructed to return to ED, call 911, or call MD for any changes in condition.   Patient escorted via WC, and D/C home via private auto.  Joellyn HaffKayla L Price 01/16/2016 10:44 AM

## 2016-01-16 NOTE — Discharge Summary (Addendum)
Physician Discharge Summary  Felicia Obrien WUJ:[id number] DOB: 1955/06/05 DOA: 01/07/2016  PCP: Mickie Hillier, MD  Admit date: 01/07/2016 Discharge date: 01/16/2016  Time spent:  Recommendations for Outpatient Follow-up:  Patient will be discharged to home.  Patient will need to follow up with primary care provider within one week of discharge.  Follow up with gastroenterology at [facility]. Patient should continue medications as prescribed.  Patient should follow a soft diet.   Discharge Diagnoses:  Recurrent, acute Pancreatitis s/p ERCP  Dehydration, moderate Hypertension Hyperlipidemia Thrombocytopenia Hypokalemia Anemia  Discharge Condition: Stable  Diet recommendation: soft  Filed Weights   01/07/16 0248 01/07/16 0445 01/07/16 0953  Weight: 64.6 kg (142 lb 8 oz) 64.4 kg (142 lb) 63.5 kg (140 lb)    History of present illness:  on 01/07/2016 by [name], NP Kezia Christianis a 60 y.o.femalewith medical history significant for recurrent pancreatitis since her 56s. She was recently hospitalized in October after undergoing ERCP at [facility] on 10/25 and experiencing post pancreatitis ERCP. CT scan done at that time revealedslight swelling of the pancreas without abscess or pseudocyst. Her initial lipase was 312 and peaked at 2359. Patient was discharged after 4 day stay here at [facility].She has subsequently undergone another ERCP at [facility] on 11/10. Unfortunately,as has occurred with previous attempts,they were unable to perform sphincterotomy. Since the procedure she has only ingested either water or ginger ale. Yesterday evening she attempted a Popsicle and developed significant abdominal pain followed by nausea and vomiting. She has not had any diarrhea. She reports she no longer takes pancreatic enzymes since her primary gastroenterologist at [facility] stated they would not be helpful given the etiology of her pancreatitis. She continues to have significant  abdominal pain. Of note she has upper lip edema that she reports was related to unintentional trauma during endoscopy on 11/10.  Hospital Course:  Recurrent, acute Pancreatitis s/p ERCP  -Patient with history of pancreatitis post prior ERCPthat has responded to symptom management, h/o pancreas divisum -Currently on full liquid diet, but continues to have nausea and vomiting  -Continue antiemetics PRN and scheduled -Gastroenterology consulted and appreciated  -Scheduled Zofran and prnPhenergan to treat nausea and vomiting (Discontinuedmorphine PCA 11/12) -Lipase trending downward, today 159 -CT abdomen pelvis with contrast: Persistent pancreatic ductal dilatation with probable large pseudocyst in the lesser sac, likely related to either pancreatitis or pancreatic duct leak.  -Transitioned to soft diet and tolerating it well -Dr. Ewing Schlein or partner will speak with [facility] on Monday regarding longer acting dose of Octreotide. -Dr. Ewing Schlein will arrange for samples of pancreatic enzymes.  -Dr. Levora Angel spoke with Dr. Shana Chute.  Recommended discontinuing octreotide and observation overnight. If continues to improve, possible d/c 11/21, with outpatient follow up with [facility] in 3 weeks and repeat imaging at that time. -Patient did well off of octreotide. No nausea or pain.   Increased anion gap metabolic acidosis -Resolved, Likely related to dehydration  Dehydration, moderate -Resolved  Hypertension -Continue atenolol 25mg  daily (reduced from home dose 50 mg)   Hyperlipidemia -Statin currently held, restart at discharge  Thrombocytopenia -Upon admission, platelets were 192, dropped to 126 (likely dilutional component) -Currently 200  Hypokalemia -Resolved  Anemia -hemoglobin currently 10.4 -drop possibly dilutional as patient has been receivng IVF  Consultants Gastroenterology  Procedures  None  Discharge Exam: Vitals:   01/15/16 2311 01/16/16 0632  BP: (!) 107/50 (!)  119/55  Pulse: 64 66  Resp: 16 16  Temp: 98.4 F (36.9 C) 98.4 F (36.9 C)  Feeling much better today. Currently, denies nausea or abdominal pain , chest pain, shortness of breath, dizziness, headache.    Exam  General: Well developed, well nourished, no distress  HEENT: NCAT, mucous membranes moist.   Cardiovascular: S1 S2 auscultated, no murmurs, RRR  Respiratory: Clear to auscultation bilaterally with equal chest rise  Abdomen: Soft, nontender, nondistended, + bowel sounds  Extremities: warm dry without cyanosis clubbing or edema  Neuro: AAOx3, nonfocal  Psych: Appropriate mood and affect, pleasant  Discharge Instructions Discharge Instructions    Discharge instructions    Complete by:  As directed    Patient will be discharged to home.  Patient will need to follow up with primary care provider within one week of discharge.  [facility] Patient should continue medications as prescribed.  Patient should follow a soft diet.     Current Discharge Medication List    CONTINUE these medications which have CHANGED   Details  atenolol (TENORMIN) 25 MG tablet Take 1 tablet (25 mg total) by mouth daily.    ondansetron (ZOFRAN-ODT) 8 MG disintegrating tablet Take 1 tablet (8 mg total) by mouth every 8 (eight) hours as needed for nausea or vomiting. Qty: 45 tablet, Refills: 0      CONTINUE these medications which have NOT CHANGED   Details  ALPRAZolam (XANAX) 0.25 MG tablet Take 0.25 mg by mouth at bedtime as needed for anxiety.    Ascorbic Acid (VITA-C PO) Take 1 tablet by mouth daily.    aspirin 81 MG tablet Take 81 mg by mouth daily.    atorvastatin (LIPITOR) 10 MG tablet Take 10 mg by mouth daily.    B Complex Vitamins (VITAMIN B COMPLEX PO) Take 1 tablet by mouth daily.    Cholecalciferol (VITAMIN D) 2000 UNITS CAPS Take 2,000 Units by mouth daily.     Multiple Vitamin (MULTI-VITAMINS) TABS Take 1 tablet by mouth daily.      Probiotic Product (PROBIOTIC DAILY) CAPS Take 1 capsule by mouth daily.    RA KRILL OIL CAPS Take 1 capsule by mouth daily.        Allergies  Allergen Reactions  . Atorvastatin Other (See Comments)    Muscle cramps with dose over 10 mg    Follow-up Information    Mickie HillierLITTLE,KEVIN LORNE, MD. Schedule an appointment as soon as possible for a visit in 1 week(s).   Specialty:  Family Medicine Why:  Hospital follow up Contact information: [address] [place] [address] [phone]        Freddy JakschUTLAW,WILLIAM M, MD. Schedule an appointment as soon as possible for a visit.   Specialty:  Gastroenterology Why:  As needed Contact information: [address] [place] [address] [phone]            The results of significant diagnostics from this hospitalization (including imaging, microbiology, ancillary and laboratory) are listed below for reference.    Significant Diagnostic Studies: Ct Abdomen Pelvis W Contrast  Result Date: 01/10/2016 CLINICAL DATA:  Abdominal pain since Friday. ERCP on Friday. Nausea and vomiting. EXAM: CT ABDOMEN AND PELVIS WITH CONTRAST TECHNIQUE: Multidetector CT imaging of the abdomen and pelvis was performed using the standard protocol following bolus administration of intravenous contrast. CONTRAST:  100mL ISOVUE-300 IOPAMIDOL (ISOVUE-300) INJECTION 61% COMPARISON:  12/20/2015. FINDINGS: Lower chest: The lung bases show no acute findings. Heart size normal. No pericardial or pleural effusion. Hepatobiliary: Sub cm low-attenuation lesion in the left hepatic lobe is too small  to characterize. Liver and gallbladder are otherwise unremarkable. No biliary ductal dilatation. Pancreas: Pancreatic ducts remains dilated, 5 mm. Pancreas is otherwise unremarkable. Spleen: Negative. Adrenals/Urinary Tract: Adrenal glands and kidneys are unremarkable. Ureters are decompressed. Bladder is grossly unremarkable. Stomach/Bowel: There is mass effect  on the body of the stomach by a large, somewhat loculated appearing fluid collection in the lesser sac, measuring up to 7.7 x 9.1 cm, new from 12/20/2015. Stomach, small bowel, appendix and colon are otherwise unremarkable. Vascular/Lymphatic: Atherosclerotic calcification of the arterial vasculature without abdominal aortic aneurysm. No pathologically enlarged lymph nodes. Reproductive: Uterus is visualized.  No adnexal mass. Other: Moderate ascites. Large fluid collection appears loculated in the lesser sac, measuring 7.7 x 9.1 cm, as mentioned above. It exerts mass effect on the gastric body. Mild haziness in the omentum, nonspecific. Musculoskeletal: No worrisome lytic or sclerotic lesions. IMPRESSION: 1. Persistent pancreatic ductal dilatation with probable large pseudocyst in the lesser sac, likely related to either pancreatitis or pancreatic duct leak. 2. Moderate ascites. 3.  Aortic atherosclerosis (ICD10-170.0). Electronically Signed   By: Leanna Battles M.D.   On: 01/10/2016 16:25   Ct Abdomen Pelvis W Contrast  Result Date: 12/20/2015 CLINICAL DATA:  Chronic right upper quadrant abdominal pain, increased today. Nausea. Status post ERCP today. EXAM: CT ABDOMEN AND PELVIS WITH CONTRAST TECHNIQUE: Multidetector CT imaging of the abdomen and pelvis was performed using the standard protocol following bolus administration of intravenous contrast. CONTRAST:  ISOVUE-300 IOPAMIDOL (ISOVUE-300) INJECTION 61% COMPARISON:  MRI abdomen dated 11/05/2015 FINDINGS: Lower chest: Lung bases clear. Hepatobiliary: Liver is within normal limits. Mild periportal edema. Vicarious excretion of contrast in the gallbladder. Pancreas: Suspected pancreatic divisum with mild prominence of the dorsal pancreatic duct, measuring 6 mm (series 2/ image 32). No associated pancreatic mass or atrophy. Spleen: Within normal limits. Adrenals/Urinary Tract: Adrenal glands are within normal limits. 4 mm right upper pole renal cyst  (series 2/ image 32). Left kidney is within normal limits. No hydronephrosis. Bladder is within normal limits. Stomach/Bowel: Stomach is within normal limits. No evidence of bowel obstruction. Normal appendix (series 2/ image 56). Vascular/Lymphatic: Atherosclerotic calcifications of the abdominal aorta and branch vessels. No evidence of abdominal aortic aneurysm. No suspicious abdominopelvic lymphadenopathy. Reproductive: Uterus is within normal limits. Left ovary is within normal limits.  No right adnexal mass. Other: Trace pelvic ascites (series 2/image 70). Musculoskeletal: Very mild degenerative changes of the lower thoracic spine. IMPRESSION: Suspected pancreatic divisum with stable mild prominence of the dorsal pancreatic duct. Vicarious excretion of contrast in the gallbladder. Mild periportal edema. Electronically Signed   By: Charline Bills M.D.   On: 12/20/2015 17:56   Dg Abdomen Acute W/chest  Result Date: 12/20/2015 CLINICAL DATA:  Right upper quadrant pain after endoscopy today. EXAM: DG ABDOMEN ACUTE W/ 1V CHEST COMPARISON:  None. FINDINGS: There is no evidence of dilated bowel loops or free intraperitoneal air. No radiopaque calculi or other significant radiographic abnormality is seen. Contrast is seen within both renal collecting systems and bladder without evidence of obstruction. Opacification of the gallbladder is also seen presumably from same day endoscopic procedure. Heart size and mediastinal contours are within normal limits. Both lungs are clear. IMPRESSION: Negative abdominal radiographs.  No acute cardiopulmonary disease. Electronically Signed   By: Tollie Eth M.D.   On: 12/20/2015 18:35    Microbiology: No results found for this or any previous visit (from the past 240 hour(s)).   Labs: Basic Metabolic Panel:  Recent Labs Lab 01/12/16 [phone]  01/13/16 0539 01/14/16 0626 01/15/16 0440 01/16/16 0544  NA 139 139 138 141 142  K 4.6 3.0* 3.7 3.8 4.0  CL 107 106 100*  101 102  CO2 22 26 31  33* 31  GLUCOSE 128* 113* 138* 116* 100*  BUN 10 8 <5* <5* 5*  CREATININE 0.76 0.67 0.70 0.72 0.64  CALCIUM 8.8* 8.4* 8.7* 9.1 9.3   Liver Function Tests:  Recent Labs Lab 01/10/16 0458 01/11/16 0607 01/12/16 0729  AST 17 15 19   ALT 12* 11* 11*  ALKPHOS 44 47 41  BILITOT 1.2 1.0 1.3*  PROT 5.4* 6.2* 5.5*  ALBUMIN 2.6* 2.8* 2.4*    Recent Labs Lab 01/10/16 0458 01/11/16 0607 01/11/16 1307 01/13/16 0539  LIPASE 379* 316* 333* 159*   No results for input(s): AMMONIA in the last 168 hours. CBC:  Recent Labs Lab 01/11/16 0607 01/13/16 0539 01/14/16 0626  WBC 8.6 4.8 4.5  NEUTROABS 7.6  --  2.9  HGB 11.6* 9.6* 10.4*  HCT 33.6* 27.7* 30.7*  MCV 88.4 88.5 89.2  PLT 185 182 200   Cardiac Enzymes: No results for input(s): CKTOTAL, CKMB, CKMBINDEX, TROPONINI in the last 168 hours. BNP: BNP (last 3 results) No results for input(s): BNP in the last 8760 hours.  ProBNP (last 3 results) No results for input(s): PROBNP in the last 8760 hours.  CBG: No results for input(s): GLUCAP in the last 168 hours.     SignedEdsel Petrin:  MIKHAIL, MARYANN  [facility] 01/16/2016, 10:12 AM

## 2016-01-16 NOTE — Discharge Instructions (Signed)
Acute Pancreatitis °Introduction °Acute pancreatitis is a condition in which the pancreas suddenly gets irritated and swollen (has inflammation). The pancreas is a large gland behind the stomach. It makes enzymes that help to digest food. The pancreas also makes hormones that help to control your blood sugar. Acute pancreatitis happens when the enzymes attack the pancreas and damage it. Most attacks last a couple of days and can cause serious problems. °Follow these instructions at home: °Eating and drinking °· Follow instructions from your doctor about diet. You may need to: °¨ Avoid alcohol. °¨ Limit how much fat is in your diet. °· Eat small meals often. Avoid eating big meals. °· Drink enough fluid to keep your pee (urine) clear or pale yellow. °· Do not drink alcohol if it caused your condition. °General instructions °· Take over-the-counter and prescription medicines only as told by your doctor. °· Do not use any tobacco products. These include cigarettes, chewing tobacco, and e-cigarettes. If you need help quitting, ask your doctor. °· Get plenty of rest. °· If directed, check your blood sugar at home as told by your doctor. °· Keep all follow-up visits as told by your doctor. This is important. °Contact a doctor if: °· You do not get better as quickly as expected. °· You have new symptoms. °· Your symptoms get worse. °· You have lasting pain or weakness. °· You continue to feel sick to your stomach (nauseous). °· You get better and then you have another pain attack. °· You have a fever. °Get help right away if: °· You cannot eat or keep fluids down. °· Your pain becomes very bad. °· Your skin or the white part of your eyes turns yellow (jaundice). °· You throw up (vomit). °· You feel dizzy or you pass out (faint). °· Your blood sugar is high (over 300 mg/dL). °This information is not intended to replace advice given to you by your health care provider. Make sure you discuss any questions you have with your  health care provider. °Document Released: 07/31/2007 Document Revised: 07/20/2015 Document Reviewed: 11/15/2014 °© 2017 Elsevier ° °

## 2016-03-25 ENCOUNTER — Other Ambulatory Visit: Payer: Self-pay | Admitting: Family Medicine

## 2016-03-25 DIAGNOSIS — Z1231 Encounter for screening mammogram for malignant neoplasm of breast: Secondary | ICD-10-CM

## 2016-04-02 ENCOUNTER — Ambulatory Visit: Payer: Managed Care, Other (non HMO) | Admitting: Dietician

## 2016-04-11 ENCOUNTER — Ambulatory Visit
Admission: RE | Admit: 2016-04-11 | Discharge: 2016-04-11 | Disposition: A | Discharge status: Home / Self Care | Payer: Managed Care, Other (non HMO) | Source: Ambulatory Visit | Attending: Family Medicine | Admitting: Family Medicine

## 2016-04-11 DIAGNOSIS — Z1231 Encounter for screening mammogram for malignant neoplasm of breast: Secondary | ICD-10-CM

## 2016-04-15 ENCOUNTER — Ambulatory Visit: Payer: Managed Care, Other (non HMO) | Admitting: Dietician

## 2016-04-22 ENCOUNTER — Ambulatory Visit: Payer: Managed Care, Other (non HMO) | Admitting: Dietician

## 2016-05-06 ENCOUNTER — Encounter: Payer: Self-pay | Admitting: Dietician

## 2016-05-06 ENCOUNTER — Encounter: Payer: Managed Care, Other (non HMO) | Attending: Gastroenterology | Admitting: Dietician

## 2016-05-06 DIAGNOSIS — Z713 Dietary counseling and surveillance: Secondary | ICD-10-CM | POA: Insufficient documentation

## 2016-05-06 DIAGNOSIS — K861 Other chronic pancreatitis: Secondary | ICD-10-CM | POA: Diagnosis not present

## 2016-05-06 DIAGNOSIS — Q453 Other congenital malformations of pancreas and pancreatic duct: Secondary | ICD-10-CM

## 2016-05-06 NOTE — Patient Instructions (Addendum)
Breakfast, Lunch, Dinner daily Small snacks when hungry. Continue a low fat diet.  Up to 25% fat (45 grams per day).  Base this on tolerance. Plan some form of carbohydrates with each meal. Be sure to eat when you are hungry.

## 2016-05-06 NOTE — Progress Notes (Signed)
Medical Nutrition Therapy:  Appt start time: 0800 end time:  0915.   Assessment:  Primary concerns today: Patient is here alone.  She would like to be sure that her diet is appropriate.  She has chronic pancreatitis.  She has changed her diet to a low fat diet and lost 30 lbs as a result of the pancreatitis.  She also reports losing 1/2 of her hair.  Hx also includes GERD with stricture in the past that has been stretched.  She has had high cholesterol and has come off of a statin to see if the dietary changes and weight loss have made a difference in her numbers.  She has been restricting her fat intake to about 10 grams per day but has slowly been increasing this but remains within the recommended limits.  She has been using My Fitness Pal to track this in the past.  She reports tolerating olive oil better than bacon.  133 lbs today which has been stable since December.  Prior to acute pancreatitis flare in August, she weighed 163 lbs.  She does not want to regain the weight.   Patient lives with her husband.  He has prediabetes and is starting to embrace the diet changes.  Patient does the cooking and both share shopping.  She is an Patent examinerT business analyst.  Preferred Learning Style:   No preference indicated   Learning Readiness:   Ready  Change in progress   MEDICATIONS: see list to include biotin, B complex, MVI, vitamin D   DIETARY INTAKE:  Usual eating pattern includes 2-3 meals and 2-3 snacks per day. Avoided foods include milk due to intolerance (dull ache).  Caffeine, alcohol, carbonated beverages, soda, bread other than Ezekiel Bread (inflamatory response??- particularly white bread), pasta sauce (due to acid), white pasta, white rice.  Intolerance to dairy, refined and processed foods.  She skips breakfast at times because she is not hungry.  24-hr recall:  B ( AM): skips OR cereal (whole grain) with almond milk OR a protein (chicken, pork, salmon, boiled egg)  WITH black  coffee Snk ( AM): 5-6 almonds OR orange or banana or other fruit  L ( PM): salad with protein (without dressing) OR protein and fruit and Ezekiel Bread Snk ( PM): nuts OR fruit D ( PM): salad with protein (with measured olive oil and balsamic or without) OR lean protein, winter squash or sweet potato or brown rice/quinoa, green vegetable Snk ( PM): non fat frozen yogurt and caramel sauce Beverages: water, black coffee, iced coffee, or 100% juice  Usual physical activity: Works out with a Psychologist, educationaltrainer twice per week.  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  Estimated energy needs: 1600 calories 120 g protein <45 g fat  Progress Towards Goal(s):  In progress.   Nutritional Diagnosis:  NB-1.1 Food and nutrition-related knowledge deficit As related to diet and pancreatitis.  As evidenced by patient report.    Intervention:  Nutrition education related to diet for chronic pancreatitis with hx of GERD.  Discussed tips to normalize eating habits and create more balance and nutrition within her day.  Breakfast, Lunch, Dinner daily Small snacks when hungry. Continue a low fat diet.  Up to 25% fat (45 grams per day).  Base this on tolerance. Plan some form of carbohydrates with each meal. Be sure to eat when you are hungry.    Teaching Method Utilized:  Visual Auditory Hands on  Handouts given during visit include:  Pancreatitis nutrition therapy from [facility]  Barriers to learning/adherence to lifestyle change: none  Demonstrated degree of understanding via:  Teach Back   Monitoring/Evaluation:  Dietary intake, exercise, and body weight prn.

## 2017-01-24 ENCOUNTER — Telehealth: Payer: Self-pay

## 2017-01-24 NOTE — Telephone Encounter (Signed)
SENT NOTES TO SCHEDULING 

## 2017-03-06 ENCOUNTER — Encounter: Payer: Self-pay | Admitting: Interventional Cardiology

## 2017-03-06 ENCOUNTER — Ambulatory Visit: Payer: Managed Care, Other (non HMO) | Admitting: Interventional Cardiology

## 2017-03-06 VITALS — BP 128/82 | HR 60 | Ht 62.0 in | Wt 137.4 lb

## 2017-03-06 DIAGNOSIS — R001 Bradycardia, unspecified: Secondary | ICD-10-CM

## 2017-03-06 DIAGNOSIS — R002 Palpitations: Secondary | ICD-10-CM

## 2017-03-06 DIAGNOSIS — I10 Essential (primary) hypertension: Secondary | ICD-10-CM

## 2017-03-06 NOTE — Patient Instructions (Signed)
Medication Instructions:  Your physician recommends that you continue on your current medications as directed. Please refer to the Current Medication list given to you today.  Labwork: None  Testing/Procedures: Your physician has recommended that you wear an event monitor. Event monitors are medical devices that record the heart's electrical activity. Doctors most often us these monitors to diagnose arrhythmias. Arrhythmias are problems with the speed or rhythm of the heartbeat. The monitor is a small, portable device. You can wear one while you do your normal daily activities. This is usually used to diagnose what is causing palpitations/syncope (passing out).   Follow-Up: Your physician recommends that you schedule a follow-up appointment as needed with Dr. Smith.   Any Other Special Instructions Will Be Listed Below (If Applicable).     If you need a refill on your cardiac medications before your next appointment, please call your pharmacy.   

## 2017-03-06 NOTE — Progress Notes (Signed)
Cardiology Office Note    Date:  03/06/2017   ID:  Felicia Obrien, DOB 03-07-1955, MRN [id number]  PCP:  Catha Gosselin, MD  Cardiologist: Lesleigh Noe, MD   Chief Complaint  Patient presents with  . Palpitations    History of Present Illness:  Felicia Obrien is a 62 y.o. female referred by Dr. Catha Gosselin for evaluation of dictations.  Previously seen greater than 3 years ago.  Prior workup demonstrated atrial premature beats.  Has underlying history of essential hypertension.  Has history of recurrent pancreatitis.  She has a history of palpitations in the past that were demonstrated to be PACs.  More recently she has been ill related to recurring bouts of pancreatitis.  She has had weight loss.  With the weight loss her blood pressure became lower and beta-blocker therapy intensity was decreased.  This led to an increase in frequency of palpitations.  She believes the lower dose of beta-blocker therapy was responsible.  The palpitations feel somewhat similar to historical prior complaints although she began to have concerns that it could represent a different process.  She is also had intermittent episodes of brief left axillary discomfort.  Episodes occur at random and are not associated with respiratory effort, movement, or physical activity.  Also not associated with palpitations.    Past Medical History:  Diagnosis Date  . Abnormal Pap smear of cervix   . Chronic pancreatitis (HCC)   . GERD (gastroesophageal reflux disease)   . Hyperlipidemia   . Hypertension   . PAC (premature atrial contraction)     Past Surgical History:  Procedure Laterality Date  . CERVICAL CONE BIOPSY      Current Medications: Outpatient Medications Prior to Visit  Medication Sig Dispense Refill  . ALPRAZolam (XANAX) 0.25 MG tablet Take 0.25 mg by mouth at bedtime as needed for anxiety.    . Ascorbic Acid (VITA-C PO) Take 1 tablet by mouth daily.    Marland Kitchen aspirin 81 MG tablet Take 81  mg by mouth daily.    Marland Kitchen atenolol (TENORMIN) 25 MG tablet Take 25 mg by mouth daily.    Marland Kitchen atorvastatin (LIPITOR) 10 MG tablet Take 10 mg by mouth daily.    . B Complex Vitamins (VITAMIN B COMPLEX PO) Take 1 tablet by mouth daily.    . Biotin 2500 MCG CAPS Take 2 chews by mouth daily.    . Cholecalciferol (VITAMIN D) 2000 UNITS CAPS Take 2,000 Units by mouth daily.     . Multiple Vitamin (MULTI-VITAMINS) TABS Take 1 tablet by mouth daily.    . Probiotic Product (PROBIOTIC DAILY) CAPS Take 1 capsule by mouth daily.    Marland Kitchen RA KRILL OIL CAPS Take 1 capsule by mouth daily.     Marland Kitchen atenolol (TENORMIN) 25 MG tablet Take 1 tablet (25 mg total) by mouth daily. (Patient not taking: Reported on 03/06/2017)    . ondansetron (ZOFRAN-ODT) 8 MG disintegrating tablet Take 1 tablet (8 mg total) by mouth every 8 (eight) hours as needed for nausea or vomiting. (Patient not taking: Reported on 05/06/2016) 45 tablet 0   No facility-administered medications prior to visit.      Allergies:   Atorvastatin   Social History   Socioeconomic History  . Marital status: Married    Spouse name: None  . Number of children: None  . Years of education: None  . Highest education level: None  Social Needs  . Financial resource strain: None  . Food insecurity -  worry: None  . Food insecurity - inability: None  . Transportation needs - medical: None  . Transportation needs - non-medical: None  Occupational History  . None  Tobacco Use  . Smoking status: Never Smoker  . Smokeless tobacco: Never Used  Substance and Sexual Activity  . Alcohol use: No  . Drug use: No  . Sexual activity: None  Other Topics Concern  . None  Social History Narrative  . None     Family History:  The patient's family history includes Breast cancer in her mother; Cancer in her mother; Heart disease in her father; Hyperlipidemia in her brother, brother, father, and sister; Hypertension in her father.   ROS:   Please see the history of  present illness.    Recurring abdominal pain related to pancreatic divisum.  Weight loss related to pancreatic All other systems reviewed and are negative.   PHYSICAL EXAM:   VS:  BP 128/82   Pulse 60   Ht 5\' 2"  (1.575 m)   Wt 137 lb 6.4 oz (62.3 kg)   BMI 25.13 kg/m    GEN: Well nourished, well developed, in no acute distress  HEENT: normal  Neck: no JVD, carotid bruits, or masses Cardiac: RRR; no murmurs, rubs, or gallops,no edema  Respiratory:  clear to auscultation bilaterally, normal work of breathing GI: soft, nontender, nondistended, + BS MS: no deformity or atrophy  Skin: warm and dry, no rash Neuro:  Alert and Oriented x 3, Strength and sensation are intact Psych: euthymic mood, full affect  Wt Readings from Last 3 Encounters:  03/06/17 137 lb 6.4 oz (62.3 kg)  05/06/16 133 lb (60.3 kg)  01/07/16 140 lb (63.5 kg)      Studies/Labs Reviewed:   EKG:  EKG performed on 03/06/17, reveals normal sinus rhythm and overall normal tracing.  Prior history of PACs.   Recent Labs: No results found for requested labs within last 8760 hours.   Lipid Panel    Component Value Date/Time   CHOL 133 01/08/2016 0730   TRIG 69 01/08/2016 0730   HDL 33 (L) 01/08/2016 0730   CHOLHDL 4.0 01/08/2016 0730   VLDL 14 01/08/2016 0730   LDLCALC 86 01/08/2016 0730    Additional studies/ records that were reviewed today include:  No prior cardiac evaluation dated is available.  Previously wore a monitor several years ago that demonstrated PACs.    ASSESSMENT:    1. Palpitations   2. Essential hypertension   3. Bradycardia      PLAN:  In order of problems listed above:  1. Current palpitation complaint may be similar to historical which was related to PACs.  She is now at least 10 years older.  Document mentation of rhythm on the current circumstances is necessary.  Rule out ventricular arrhythmia versus other dysrhythmia maturity including atrial fibrillation. 2. Excellent  control on the current medical regimen. 3. Bradycardia on higher doses of atenolol.  Currently only on 25 mg daily.  Atenolol has been a chronic therapy.  We will plan to perform a continuous monitor for at least 2 weeks and probably for up to 30 days characterize her current complaint of palpitations and to exclude atrial fibrillation of the potential he harmful arrhythmias can develop in her age.   Medication Adjustments/Labs and Tests Ordered: Current medicines are reviewed at length with the patient today.  Concerns regarding medicines are outlined above.  Medication changes, Labs and Tests ordered today are listed in the Patient Instructions below.  Patient Instructions  Medication Instructions:  Your physician recommends that you continue on your current medications as directed. Please refer to the Current Medication list given to you today.  Labwork: None  Testing/Procedures: Your physician has recommended that you wear an event monitor. Event monitors are medical devices that record the heart's electrical activity. Doctors most often [place] these monitors to diagnose arrhythmias. Arrhythmias are problems with the speed or rhythm of the heartbeat. The monitor is a small, portable device. You can wear one while you do your normal daily activities. This is usually used to diagnose what is causing palpitations/syncope (passing out).   Follow-Up: Your physician recommends that you schedule a follow-up appointment as needed with Dr. Katrinka Blazing.    Any Other Special Instructions Will Be Listed Below (If Applicable).     If you need a refill on your cardiac medications before your next appointment, please call your pharmacy.      Signed, Lesleigh Noe, MD  03/06/2017 2:43 PM    [facility] [address] [place], [place], [place]  [address] Phone: [phone]; Fax: [phone]

## 2017-03-13 ENCOUNTER — Ambulatory Visit (INDEPENDENT_AMBULATORY_CARE_PROVIDER_SITE_OTHER): Payer: Managed Care, Other (non HMO)

## 2017-03-13 DIAGNOSIS — R002 Palpitations: Secondary | ICD-10-CM

## 2017-03-18 ENCOUNTER — Other Ambulatory Visit: Payer: Self-pay | Admitting: Family Medicine

## 2017-03-18 DIAGNOSIS — Z1231 Encounter for screening mammogram for malignant neoplasm of breast: Secondary | ICD-10-CM

## 2017-04-16 ENCOUNTER — Ambulatory Visit
Admission: RE | Admit: 2017-04-16 | Discharge: 2017-04-16 | Disposition: A | Discharge status: Home / Self Care | Payer: Managed Care, Other (non HMO) | Source: Ambulatory Visit | Attending: Family Medicine | Admitting: Family Medicine

## 2017-04-16 DIAGNOSIS — Z1231 Encounter for screening mammogram for malignant neoplasm of breast: Secondary | ICD-10-CM

## 2017-12-09 ENCOUNTER — Other Ambulatory Visit: Payer: Self-pay | Admitting: Family Medicine

## 2017-12-09 DIAGNOSIS — E2839 Other primary ovarian failure: Secondary | ICD-10-CM

## 2018-03-16 ENCOUNTER — Other Ambulatory Visit: Payer: Self-pay | Admitting: Family Medicine

## 2018-03-16 DIAGNOSIS — Z1231 Encounter for screening mammogram for malignant neoplasm of breast: Secondary | ICD-10-CM

## 2018-04-20 ENCOUNTER — Ambulatory Visit
Admission: RE | Admit: 2018-04-20 | Discharge: 2018-04-20 | Disposition: A | Discharge status: Home / Self Care | Payer: Managed Care, Other (non HMO) | Source: Ambulatory Visit | Attending: Family Medicine | Admitting: Family Medicine

## 2018-04-20 ENCOUNTER — Other Ambulatory Visit: Payer: Managed Care, Other (non HMO)

## 2018-04-20 DIAGNOSIS — Z1231 Encounter for screening mammogram for malignant neoplasm of breast: Secondary | ICD-10-CM

## 2018-04-26 HISTORY — PX: WHIPPLE PROCEDURE: SHX2667

## 2018-12-07 ENCOUNTER — Telehealth: Payer: Self-pay | Admitting: Interventional Cardiology

## 2018-12-07 NOTE — Telephone Encounter (Signed)
Pt c/o BP issue: STAT if pt c/o blurred vision, one-sided weakness or slurred speech  1. What are your last 5 BP readings?  159/74. 154/99. 136/89, 158/84  2. Are you having any other symptoms (ex. Dizziness, headache, blurred vision, passed out)? no  3. What is your BP issue?  Blood Pressure running high- pt wanted to be seen- She have a Virtual Visit on 12-09-18

## 2018-12-07 NOTE — Telephone Encounter (Signed)
Pt has called to report elevated BP over the past several days: 159/74, 154/99, 136/89, 158/84.... she has had some headaches.. no dizziness, chest pain, sob.   Pt was made a virtual appt with the schedulers prior to me speaking with her but she consents and will have a list of her meds and BP readings available for the visit on 12/09/18.         Virtual Visit Pre-Appointment Phone Call  "(Name), I am calling you today to discuss your upcoming appointment. We are currently trying to limit exposure to the virus that causes COVID-19 by seeing patients at home rather than in the office."  1. "What is the BEST phone number to call the day of the visit?" - include this in appointment notes  2. "Do you have or have access to (through a family member/friend) a smartphone with video capability that we can use for your visit?" a. If yes - list this number in appt notes as "cell" (if different from BEST phone #) and list the appointment type as a VIDEO visit in appointment notes b. If no - list the appointment type as a PHONE visit in appointment notes  3. Confirm consent - "In the setting of the current Covid19 crisis, you are scheduled for a (phone or video) visit with your provider on (date) at (time).  Just as we do with many in-office visits, in order for you to participate in this visit, we must obtain consent.  If you'd like, I can send this to your mychart (if signed up) or email for you to review.  Otherwise, I can obtain your verbal consent now.  All virtual visits are billed to your insurance company just like a normal visit would be.  By agreeing to a virtual visit, we'd like you to understand that the technology does not allow for your provider to perform an examination, and thus may limit your provider's ability to fully assess your condition. If your provider identifies any concerns that need to be evaluated in person, we will make arrangements to do so.  Finally, though the technology is  pretty good, we cannot assure that it will always work on either your or our end, and in the setting of a video visit, we may have to convert it to a phone-only visit.  In either situation, we cannot ensure that we have a secure connection.  Are you willing to proceed?" STAFF: Did the patient verbally acknowledge consent to telehealth visit? Document YES/NO here: YES  4. Advise patient to be prepared - "Two hours prior to your appointment, go ahead and check your blood pressure, pulse, oxygen saturation, and your weight (if you have the equipment to check those) and write them all down. When your visit starts, your provider will ask you for this information. If you have an Apple Watch or Kardia device, please plan to have heart rate information ready on the day of your appointment. Please have a pen and paper handy nearby the day of the visit as well."  5. Give patient instructions for MyChart download to smartphone OR Doximity/Doxy.me as below if video visit (depending on what platform provider is using)  6. Inform patient they will receive a phone call 15 minutes prior to their appointment time (may be from unknown caller ID) so they should be prepared to answer    TELEPHONE CALL NOTE  Felicia Obrien has been deemed a candidate for a follow-up tele-health visit to limit community exposure during the Covid-19  pandemic. I spoke with the patient via phone to ensure availability of phone/video source, confirm preferred email & phone number, and discuss instructions and expectations.  I reminded Felicia Obrien to be prepared with any vital sign and/or heart rhythm information that could potentially be obtained via home monitoring, at the time of her visit. I reminded Felicia Obrien to expect a phone call prior to her visit.  Bertram Millard, RN 12/07/2018 1:14 PM   INSTRUCTIONS FOR DOWNLOADING THE MYCHART APP TO SMARTPHONE  - The patient must first make sure to have activated MyChart and know  their login information - If Apple, go to [employer] and type in MyChart in the search bar and download the app. If Android, ask patient to go to [employer] and type in [place] in the search bar and download the app. The app is free but as with any other app downloads, their phone may require them to verify saved payment information or Apple/Android password.  - The patient will need to then log into the app with their MyChart username and password, and select [facility] as their healthcare provider to link the account. When it is time for your visit, go to the MyChart app, find appointments, and click Begin Video Visit. Be sure to Select Allow for your device to access the Microphone and Camera for your visit. You will then be connected, and your provider will be with you shortly.  **If they have any issues connecting, or need assistance please contact MyChart service desk [phone])**  **If using a computer, in order to ensure the best quality for their visit they will need to use either of the following Internet Browsers: [employer] or Google Chrome**  IF USING DOXIMITY or DOXY.ME - The patient will receive a link just prior to their visit by text.     FULL LENGTH CONSENT FOR TELE-HEALTH VISIT   I hereby voluntarily request, consent and authorize [facility] and its employed or contracted physicians, physician assistants, nurse practitioners or other licensed health care professionals (the Practitioner), to provide me with telemedicine health care services (the "Services") as deemed necessary by the treating Practitioner. I acknowledge and consent to receive the Services by the Practitioner via telemedicine. I understand that the telemedicine visit will involve communicating with the Practitioner through live audiovisual communication technology and the disclosure of certain medical information by electronic transmission. I acknowledge that I have been given the  opportunity to request an in-person assessment or other available alternative prior to the telemedicine visit and am voluntarily participating in the telemedicine visit.  I understand that I have the right to withhold or withdraw my consent to the use of telemedicine in the course of my care at any time, without affecting my right to future care or treatment, and that the Practitioner or I may terminate the telemedicine visit at any time. I understand that I have the right to inspect all information obtained and/or recorded in the course of the telemedicine visit and may receive copies of available information for a reasonable fee.  I understand that some of the potential risks of receiving the Services via telemedicine include:  Marland Kitchen Delay or interruption in medical evaluation due to technological equipment failure or disruption; . Information transmitted may not be sufficient (e.g. poor resolution of images) to allow for appropriate medical decision making by the Practitioner; and/or  . In rare instances, security protocols could fail, causing a breach of personal health information.  Furthermore, I  acknowledge that it is my responsibility to provide information about my medical history, conditions and care that is complete and accurate to the best of my ability. I acknowledge that Practitioner's advice, recommendations, and/or decision may be based on factors not within their control, such as incomplete or inaccurate data provided by me or distortions of diagnostic images or specimens that may result from electronic transmissions. I understand that the practice of medicine is not an exact science and that Practitioner makes no warranties or guarantees regarding treatment outcomes. I acknowledge that I will receive a copy of this consent concurrently upon execution via email to the email address I last provided but may also request a printed copy by calling the office of [facility].    I understand that  my insurance will be billed for this visit.   I have read or had this consent read to me. . I understand the contents of this consent, which adequately explains the benefits and risks of the Services being provided via telemedicine.  . I have been provided ample opportunity to ask questions regarding this consent and the Services and have had my questions answered to my satisfaction. . I give my informed consent for the services to be provided through the use of telemedicine in my medical care  By participating in this telemedicine visit I agree to the above.

## 2018-12-08 NOTE — Progress Notes (Signed)
Virtual Visit via Video Note   This visit type was conducted due to national recommendations for restrictions regarding the COVID-19 Pandemic (e.g. social distancing) in an effort to limit this patient's exposure and mitigate transmission in our community.  Due to her co-morbid illnesses, this patient is at least at moderate risk for complications without adequate follow up.  This format is felt to be most appropriate for this patient at this time.  All issues noted in this document were discussed and addressed.  A limited physical exam was performed with this format.  Please refer to the patient's chart for her consent to telehealth for [facility].   Date:  12/09/2018   ID:  Felicia PieriniBarbara Poynter, DOB 03-05-1955, MRN [id number]  Patient Location: Home Provider Location: Home  PCP:  Catha GosselinLittle, Kevin, MD  Cardiologist:  Lesleigh NoeHenry W Smith III, MD   Evaluation Performed:  Follow-Up Visit  Chief Complaint:  BP issue  History of Present Illness:    Felicia Obrien is a 63 y.o. female with hx of palpitations, PACs, recurrent pancreatitis, thyroid issue, HTN and HLD seen for BP issue.  She has a history of palpitations in the past that were demonstrated to be PACs. With the weight loss her blood pressure became lower and beta-blocker therapy intensity was decreased.  This led to an increase in frequency of palpitations.  She believes the lower dose of beta-blocker therapy was responsible.   Last seen by Dr. Katrinka BlazingSmith 02/2017. No further work up after below monitor result.  Monitor 02/2017:  Sinus rhythm and sinus bradycardia.  Occasional PACs and PVC that correlate with complaint of "flutter" on several occasions.   Sinus rhythm and sinus bradycardia without significant periods of heart rate greater than 110 bpm.  This raises concern for sinus node dysfunction.  Seen today for BP issue.  She had a whipple procedure earlier this year.  Also started on levothyroxine for low thyroid.  She has been  dealing with high blood pressure for past couple of months.  Blood pressure runs in the 160s to 90s range despite increasing her atenolol to 50 mg daily.  Working from home.  Unable to do exercise due to pandemic.  Denies chest pain, shortness of breath, palpitation, orthopnea, PND, syncope or lower extremity edema.   The patient does not have symptoms concerning for COVID-19 infection (fever, chills, cough, or new shortness of breath).    Past Medical History:  Diagnosis Date  . Abnormal Pap smear of cervix   . Chronic pancreatitis (HCC)   . GERD (gastroesophageal reflux disease)   . Hyperlipidemia   . Hypertension   . PAC (premature atrial contraction)    Past Surgical History:  Procedure Laterality Date  . CERVICAL CONE BIOPSY      Prior to Admission medications   Medication Sig Start Date End Date Taking? Authorizing Provider  ALPRAZolam Prudy Feeler(XANAX) 0.25 MG tablet Take 0.25 mg by mouth at bedtime as needed for anxiety.   Yes [provider]  Ascorbic Acid (VITA-C PO) Take 1 tablet by mouth daily.   Yes [provider]  aspirin 81 MG tablet Take 81 mg by mouth daily.   Yes [provider]  atenolol (TENORMIN) 50 MG tablet Take 50 mg by mouth daily.    Yes [provider]  atorvastatin (LIPITOR) 10 MG tablet Take 10 mg by mouth daily.   Yes [provider]  B Complex Vitamins (VITAMIN B COMPLEX PO) Take 1 tablet by mouth daily.   Yes  [provider]  Biotin 2500 MCG CAPS Take 2 chews by mouth daily.   Yes [provider]  Cholecalciferol (VITAMIN D) 2000 UNITS CAPS Take 2,000 Units by mouth daily.    Yes [provider]  levothyroxine (SYNTHROID) 25 MCG tablet Take 25 mcg by mouth daily before breakfast.   Yes [provider]  Multiple Vitamin (MULTI-VITAMINS) TABS Take 1 tablet by mouth daily.   Yes [provider]  Probiotic Product (PROBIOTIC DAILY) CAPS Take 1 capsule by mouth daily.   Yes  [provider]  RA KRILL OIL CAPS Take 1 capsule by mouth daily.    Yes [provider]  amLODipine (NORVASC) 2.5 MG tablet Take 1 tablet (2.5 mg total) by mouth daily. 12/09/18   Leanor Kail, PA    Allergies:   Atorvastatin   Social History   Tobacco Use  . Smoking status: Never Smoker  . Smokeless tobacco: Never Used  Substance Use Topics  . Alcohol use: No  . Drug use: No     Family Hx: The patient's family history includes Breast cancer (age of onset: 15) in her mother; Cancer in her mother; Heart disease in her father; Hyperlipidemia in her brother, brother, father, and sister; Hypertension in her father.  ROS:   Please see the history of present illness.    All other systems reviewed and are negative.   Prior CV studies:   The following studies were reviewed today:  As above  Echo 11/2015 - Left ventricle: The cavity size was normal. Wall thickness was   normal. Systolic function was normal. The estimated ejection   fraction was in the range of 55% to 60%. Wall motion was normal;   there were no regional wall motion abnormalities. Doppler   parameters are consistent with abnormal left ventricular   relaxation (grade 1 diastolic dysfunction). - Left atrium: The atrium was moderately dilated.  Impressions:  - Normal LV systolic function; grade 1 diastolic dysfunction;   moderate LAE; mild TR.  Labs/Other Tests and Data Reviewed:    EKG:  No ECG reviewed.  Recent Labs: No results found for requested labs within last 8760 hours.   Recent Lipid Panel Lab Results  Component Value Date/Time   CHOL 133 01/08/2016 07:30 AM   TRIG 69 01/08/2016 07:30 AM   HDL 33 (L) 01/08/2016 07:30 AM   CHOLHDL 4.0 01/08/2016 07:30 AM   LDLCALC 86 01/08/2016 07:30 AM    Wt Readings from Last 3 Encounters:  12/09/18 126 lb (57.2 kg)  03/06/17 137 lb 6.4 oz (62.3 kg)  05/06/16 133 lb (60.3 kg)     Objective:    Vital Signs:  BP 140/85    Pulse (!) 57   Ht 5\' 2"  (1.575 m)   Wt 126 lb (57.2 kg)   BMI 23.05 kg/m    VITAL SIGNS:  reviewed GEN:  no acute distress EYES:  sclerae anicteric, EOMI - Extraocular Movements Intact RESPIRATORY:  normal respiratory effort, symmetric expansion CARDIOVASCULAR:  no peripheral edema SKIN:  no rash, lesions or ulcers. MUSCULOSKELETAL:  no obvious deformities. NEURO:  alert and oriented x 3, no obvious focal deficit PSYCH:  normal affect  ASSESSMENT & PLAN:    1. Hypertension No improvement despite increasing atenolol to 50 mg few months ago.  Will add amlodipine 2.5 mg daily.  She will keep a log of her blood pressure and send [place] through my chart for review in few days.  2. Palpitations/PACs No recurrence.  Continue beta-blocker at current dose.  COVID-19 Education: The signs and symptoms of COVID-19 were discussed with the patient and how to seek care for testing (follow up with PCP or arrange E-visit).  The importance of social distancing was discussed today.  Time:   Today, I have spent 14 minutes with the patient with telehealth technology discussing the above problems.     Medication Adjustments/Labs and Tests Ordered: Current medicines are reviewed at length with the patient today.  Concerns regarding medicines are outlined above.   Tests Ordered: No orders of the defined types were placed in this encounter.   Medication Changes: Meds ordered this encounter  Medications  . amLODipine (NORVASC) 2.5 MG tablet    Sig: Take 1 tablet (2.5 mg total) by mouth daily.    Dispense:  90 tablet    Refill:  3    Follow Up:  Either In Person or Virtual Visit in 6 month(s)  Signed, Manson Passey, PA  12/09/2018 9:10 AM    [facility]

## 2018-12-09 ENCOUNTER — Encounter: Payer: Self-pay | Admitting: Physician Assistant

## 2018-12-09 ENCOUNTER — Telehealth (INDEPENDENT_AMBULATORY_CARE_PROVIDER_SITE_OTHER): Payer: Managed Care, Other (non HMO) | Admitting: Physician Assistant

## 2018-12-09 ENCOUNTER — Other Ambulatory Visit: Payer: Self-pay

## 2018-12-09 VITALS — BP 140/85 | HR 57 | Ht 62.0 in | Wt 126.0 lb

## 2018-12-09 DIAGNOSIS — R002 Palpitations: Secondary | ICD-10-CM

## 2018-12-09 DIAGNOSIS — I10 Essential (primary) hypertension: Secondary | ICD-10-CM

## 2018-12-09 MED ORDER — AMLODIPINE BESYLATE 2.5 MG PO TABS: 2.5000 mg | ORAL_TABLET | Freq: Every day | ORAL | 3 refills | Status: DC

## 2018-12-09 NOTE — Patient Instructions (Signed)
Medication Instructions:  START: Amlodipine 2.5 mg once a day   If you need a refill on your cardiac medications before your next appointment, please call your pharmacy.   Lab work: None   If you have labs (blood work) drawn today and your tests are completely normal, you will receive your results only by: Marland Kitchen MyChart Message (if you have MyChart) OR . A paper copy in the mail If you have any lab test that is abnormal or we need to change your treatment, we will call you to review the results.  Testing/Procedures: None   Follow-Up: At [facility], you and your health needs are our priority.  As part of our continuing mission to provide you with exceptional heart care, we have created designated Provider Care Teams.  These Care Teams include your primary Cardiologist (physician) and Advanced Practice Providers (APPs -  Physician Assistants and Nurse Practitioners) who all work together to provide you with the care you need, when you need it. You will need a follow up appointment in 6 months.  Please call our office 2 months in advance to schedule this appointment.  You may see Sinclair Grooms, MD or one of the following Advanced Practice Providers on your designated Care Team:   Truitt Merle, NP Cecilie Kicks, NP . Kathyrn Drown, NP  Any Other Special Instructions Will Be Listed Below (If Applicable).  Keep a log of your blood pressures and send [place] a report in 10 days

## 2019-05-21 ENCOUNTER — Other Ambulatory Visit: Payer: Self-pay | Admitting: Family Medicine

## 2019-05-21 DIAGNOSIS — Z1231 Encounter for screening mammogram for malignant neoplasm of breast: Secondary | ICD-10-CM

## 2019-06-04 ENCOUNTER — Ambulatory Visit
Admission: RE | Admit: 2019-06-04 | Discharge: 2019-06-04 | Disposition: A | Discharge status: Home / Self Care | Payer: Managed Care, Other (non HMO) | Source: Ambulatory Visit | Attending: Family Medicine | Admitting: Family Medicine

## 2019-06-04 ENCOUNTER — Other Ambulatory Visit: Payer: Self-pay

## 2019-06-04 DIAGNOSIS — Z1231 Encounter for screening mammogram for malignant neoplasm of breast: Secondary | ICD-10-CM

## 2019-06-07 ENCOUNTER — Other Ambulatory Visit: Payer: Self-pay | Admitting: Family Medicine

## 2019-06-07 DIAGNOSIS — R928 Other abnormal and inconclusive findings on diagnostic imaging of breast: Secondary | ICD-10-CM

## 2019-06-21 ENCOUNTER — Ambulatory Visit
Admission: RE | Admit: 2019-06-21 | Discharge: 2019-06-21 | Disposition: A | Discharge status: Home / Self Care | Payer: Managed Care, Other (non HMO) | Source: Ambulatory Visit | Attending: Family Medicine | Admitting: Family Medicine

## 2019-06-21 ENCOUNTER — Other Ambulatory Visit: Payer: Self-pay | Admitting: Family Medicine

## 2019-06-21 ENCOUNTER — Other Ambulatory Visit: Payer: Self-pay

## 2019-06-21 DIAGNOSIS — N6310 Unspecified lump in the right breast, unspecified quadrant: Secondary | ICD-10-CM

## 2019-06-21 DIAGNOSIS — R928 Other abnormal and inconclusive findings on diagnostic imaging of breast: Secondary | ICD-10-CM

## 2019-07-02 ENCOUNTER — Other Ambulatory Visit: Payer: Managed Care, Other (non HMO)

## 2019-07-05 NOTE — Progress Notes (Signed)
Cardiology Office Note:    Date:  07/06/2019   ID:  Felicia Obrien, DOB 16-Jan-1956, MRN [id number]  PCP:  Hulan Fess, MD  Cardiologist:  Sinclair Grooms, MD   Referring MD: Hulan Fess, MD   Chief Complaint  Patient presents with  . Hypertension    History of Present Illness:    Felicia Obrien is a 64 y.o. female with a hx of palpitations, hyperlipidemia, and hypertension.   She is doing well.  She is concerned about her blood pressures.  Last October she did multiple blood pressure recordings and eventually after increasing Tenormin to 50 mg/day was running blood pressures in the less than 140 mmHg range.  She is not exercising.  She has gained some weight.  Past Medical History:  Diagnosis Date  . Abnormal Pap smear of cervix   . Chronic pancreatitis ([place])   . GERD (gastroesophageal reflux disease)   . Hyperlipidemia   . Hypertension   . PAC (premature atrial contraction)     Past Surgical History:  Procedure Laterality Date  . CERVICAL CONE BIOPSY      Current Medications: Current Meds  Medication Sig  . ALPRAZolam (XANAX) 0.25 MG tablet Take 0.25 mg by mouth at bedtime as needed for anxiety.  . Ascorbic Acid (VITA-C PO) Take 1 tablet by mouth daily.  Marland Kitchen aspirin 81 MG tablet Take 81 mg by mouth daily.  Marland Kitchen atenolol (TENORMIN) 50 MG tablet Take 50 mg by mouth daily.   Marland Kitchen atorvastatin (LIPITOR) 10 MG tablet Take 10 mg by mouth daily.  . B Complex Vitamins (VITAMIN B COMPLEX PO) Take 1 tablet by mouth daily.  . Biotin 2500 MCG CAPS Take 2 chews by mouth daily.  . Cholecalciferol (VITAMIN D) 2000 UNITS CAPS Take 2,000 Units by mouth daily.   Marland Kitchen levothyroxine (SYNTHROID) 25 MCG tablet Take 25 mcg by mouth daily before breakfast.  . MAGNESIUM PO Take 166 mg by mouth daily. Gummies  . Multiple Vitamin (MULTI-VITAMINS) TABS Take 1 tablet by mouth daily.  Marland Kitchen omeprazole (PRILOSEC OTC) 20 MG tablet Take 20 mg by mouth daily.  . Probiotic Product (PROBIOTIC DAILY)  CAPS Take 1 capsule by mouth daily.  Marland Kitchen RA KRILL OIL CAPS Take 1 capsule by mouth daily.      Allergies:   Other and Atorvastatin   Social History   Socioeconomic History  . Marital status: Married    Spouse name: Not on file  . Number of children: Not on file  . Years of education: Not on file  . Highest education level: Not on file  Occupational History  . Not on file  Tobacco Use  . Smoking status: Never Smoker  . Smokeless tobacco: Never Used  Substance and Sexual Activity  . Alcohol use: No  . Drug use: No  . Sexual activity: Not on file  Other Topics Concern  . Not on file  Social History Narrative  . Not on file   Social Determinants of Health   Financial Resource Strain:   . Difficulty of Paying Living Expenses:   Food Insecurity:   . Worried About Charity fundraiser in the Last Year:   . Arboriculturist in the Last Year:   Transportation Needs:   . Film/video editor (Medical):   Marland Kitchen Lack of Transportation (Non-Medical):   Physical Activity:   . Days of Exercise per Week:   . Minutes of Exercise per Session:   Stress:   . Feeling of  Stress :   Social Connections:   . Frequency of Communication with Friends and Family:   . Frequency of Social Gatherings with Friends and Family:   . Attends Religious Services:   . Active Member of Clubs or Organizations:   . Attends Banker Meetings:   Marland Kitchen Marital Status:      Family History: The patient's family history includes Breast cancer (age of onset: 45) in her mother; Cancer in her mother; Heart disease in her father; Hyperlipidemia in her brother, brother, father, and sister; Hypertension in her father.  ROS:   Please see the history of present illness.    No alcohol use.  Not watching salt in her diet.  Anxiety concerning health.  Neuroendocrine Whipple resection.  All other systems reviewed and are negative.  EKGs/Labs/Other Studies Reviewed:    The following studies were reviewed today: No  new data  EKG:  EKG normal sinus rhythm, poor R wave progression V1 through V4.  Nonspecific T wave flattening.  No significant change compared to prior.  Recent Labs: No results found for requested labs within last 8760 hours.  Recent Lipid Panel    Component Value Date/Time   CHOL 133 01/08/2016 0730   TRIG 69 01/08/2016 0730   HDL 33 (L) 01/08/2016 0730   CHOLHDL 4.0 01/08/2016 0730   VLDL 14 01/08/2016 0730   LDLCALC 86 01/08/2016 0730    Physical Exam:    VS:  BP (!) 164/88   Pulse 62   Ht 5\' 2"  (1.575 m)   Wt 136 lb 6.4 oz (61.9 kg)   SpO2 99%   BMI 24.95 kg/m     Wt Readings from Last 3 Encounters:  07/06/19 136 lb 6.4 oz (61.9 kg)  12/09/18 126 lb (57.2 kg)  03/06/17 137 lb 6.4 oz (62.3 kg)     GEN: Healthy appearing. No acute distress HEENT: Normal NECK: No JVD. LYMPHATICS: No lymphadenopathy CARDIAC:  RRR without murmur, gallop, or edema. VASCULAR:  Normal Pulses. No bruits. RESPIRATORY:  Clear to auscultation without rales, wheezing or rhonchi  ABDOMEN: Soft, non-tender, non-distended, No pulsatile mass, MUSCULOSKELETAL: No deformity  SKIN: Warm and dry NEUROLOGIC:  Alert and oriented x 3 PSYCHIATRIC:  Normal affect   ASSESSMENT:    1. Essential hypertension   2. PAC (premature atrial contraction)   3. Mixed hyperlipidemia   4. Educated about COVID-19 virus infection    PLAN:    In order of problems listed above:  1. Blood pressure is not as well-controlled as it should be.  She feels this has something to do with initiation of levothyroxine.  Noticed that her blood pressure began climbing after the medication was started.  Tenormin was increased to 50 mg/day.  Blood pressure normalized.  More recently she has been running blood pressures that have been elevated in the 140 to 160 mmHg systolic range.  She is not exercising and diet is not well controlled relative to sodium..  I have encouraged weight loss, salt restriction, and more liberal  exercise.  She will monitor pressures at home.  She needs to get her device calibrated against an office device. 2. No complaints of palpitations. 3. Continues on Lipitor 10 mg/day.  Target less than 70. 4. Vaccine has been received.  Social distancing is being practiced.  She will monitor her blood pressures.  Her target systolic blood pressure ranges 120 to 140 mmHg with diastolic pressures of 80 or less.  If she continues to run above these  levels on her home device we will need to add perhaps amlodipine or an ACE/ARB or a diuretic.  No action is taken today.  She will add exercise and salt restriction.   Medication Adjustments/Labs and Tests Ordered: Current medicines are reviewed at length with the patient today.  Concerns regarding medicines are outlined above.  Orders Placed This Encounter  Procedures  . EKG 12-Lead   No orders of the defined types were placed in this encounter.   Patient Instructions  Medication Instructions:  Your physician recommends that you continue on your current medications as directed. Please refer to the Current Medication list given to you today.  *If you need a refill on your cardiac medications before your next appointment, please call your pharmacy*   Lab Work: None If you have labs (blood work) drawn today and your tests are completely normal, you will receive your results only by: Marland Kitchen MyChart Message (if you have MyChart) OR . A paper copy in the mail If you have any lab test that is abnormal or we need to change your treatment, we will call you to review the results.   Testing/Procedures: None   Follow-Up: At [facility], you and your health needs are our priority.  As part of our continuing mission to provide you with exceptional heart care, we have created designated Provider Care Teams.  These Care Teams include your primary Cardiologist (physician) and Advanced Practice Providers (APPs -  Physician Assistants and Nurse Practitioners)  who all work together to provide you with the care you need, when you need it.  We recommend signing up for the patient portal called "MyChart".  Sign up information is provided on this After Visit Summary.  MyChart is used to connect with patients for Virtual Visits (Telemedicine).  Patients are able to view lab/test results, encounter notes, upcoming appointments, etc.  Non-urgent messages can be sent to your provider as well.   To learn more about what you can do with MyChart, go to [email or web address].    Your next appointment:   6-9 month(s)  The format for your next appointment:   In Person  Provider:   You may see Lesleigh Noe, MD or one of the following Advanced Practice Providers on your designated Care Team:    Norma Fredrickson, NP  Nada Boozer, NP  Georgie Chard, NP    Other Instructions  Monitor your blood pressure at least two hours after medication.  Call the office or send a MyChart message with those readings in 2-3 weeks.     Signed, Lesleigh Noe, MD  07/06/2019 4:10 PM    [facility]

## 2019-07-06 ENCOUNTER — Ambulatory Visit: Payer: Managed Care, Other (non HMO) | Admitting: Interventional Cardiology

## 2019-07-06 ENCOUNTER — Ambulatory Visit
Admission: RE | Admit: 2019-07-06 | Discharge: 2019-07-06 | Disposition: A | Discharge status: Home / Self Care | Payer: Managed Care, Other (non HMO) | Source: Ambulatory Visit | Attending: Family Medicine | Admitting: Family Medicine

## 2019-07-06 ENCOUNTER — Encounter: Payer: Self-pay | Admitting: Interventional Cardiology

## 2019-07-06 ENCOUNTER — Other Ambulatory Visit: Payer: Self-pay

## 2019-07-06 VITALS — BP 164/88 | HR 62 | Ht 62.0 in | Wt 136.4 lb

## 2019-07-06 DIAGNOSIS — E782 Mixed hyperlipidemia: Secondary | ICD-10-CM

## 2019-07-06 DIAGNOSIS — N6310 Unspecified lump in the right breast, unspecified quadrant: Secondary | ICD-10-CM

## 2019-07-06 DIAGNOSIS — I10 Essential (primary) hypertension: Secondary | ICD-10-CM

## 2019-07-06 DIAGNOSIS — Z7189 Other specified counseling: Secondary | ICD-10-CM | POA: Diagnosis not present

## 2019-07-06 DIAGNOSIS — I491 Atrial premature depolarization: Secondary | ICD-10-CM

## 2019-07-06 NOTE — Patient Instructions (Signed)
Medication Instructions:  Your physician recommends that you continue on your current medications as directed. Please refer to the Current Medication list given to you today.  *If you need a refill on your cardiac medications before your next appointment, please call your pharmacy*   Lab Work: None If you have labs (blood work) drawn today and your tests are completely normal, you will receive your results only by: Marland Kitchen MyChart Message (if you have MyChart) OR . A paper copy in the mail If you have any lab test that is abnormal or we need to change your treatment, we will call you to review the results.   Testing/Procedures: None   Follow-Up: At [facility], you and your health needs are our priority.  As part of our continuing mission to provide you with exceptional heart care, we have created designated Provider Care Teams.  These Care Teams include your primary Cardiologist (physician) and Advanced Practice Providers (APPs -  Physician Assistants and Nurse Practitioners) who all work together to provide you with the care you need, when you need it.  We recommend signing up for the patient portal called "MyChart".  Sign up information is provided on this After Visit Summary.  MyChart is used to connect with patients for Virtual Visits (Telemedicine).  Patients are able to view lab/test results, encounter notes, upcoming appointments, etc.  Non-urgent messages can be sent to your provider as well.   To learn more about what you can do with MyChart, go to [email or web address].    Your next appointment:   6-9 month(s)  The format for your next appointment:   In Person  Provider:   You may see Lesleigh Noe, MD or one of the following Advanced Practice Providers on your designated Care Team:    Norma Fredrickson, NP  Nada Boozer, NP  Georgie Chard, NP    Other Instructions  Monitor your blood pressure at least two hours after medication.  Call the office or send a MyChart  message with those readings in 2-3 weeks.

## 2019-07-26 ENCOUNTER — Other Ambulatory Visit: Payer: Self-pay

## 2019-07-26 ENCOUNTER — Encounter (HOSPITAL_COMMUNITY): Payer: Self-pay | Admitting: Emergency Medicine

## 2019-07-26 ENCOUNTER — Observation Stay (HOSPITAL_COMMUNITY)
Admission: EM | Admit: 2019-07-26 | Discharge: 2019-07-27 | Disposition: A | Discharge status: Home / Self Care | Payer: Managed Care, Other (non HMO) | Attending: Family Medicine | Admitting: Family Medicine

## 2019-07-26 DIAGNOSIS — E876 Hypokalemia: Secondary | ICD-10-CM | POA: Diagnosis present

## 2019-07-26 DIAGNOSIS — Z79899 Other long term (current) drug therapy: Secondary | ICD-10-CM | POA: Insufficient documentation

## 2019-07-26 DIAGNOSIS — E785 Hyperlipidemia, unspecified: Secondary | ICD-10-CM | POA: Diagnosis not present

## 2019-07-26 DIAGNOSIS — I491 Atrial premature depolarization: Secondary | ICD-10-CM | POA: Insufficient documentation

## 2019-07-26 DIAGNOSIS — Z79890 Hormone replacement therapy (postmenopausal): Secondary | ICD-10-CM | POA: Diagnosis not present

## 2019-07-26 DIAGNOSIS — Z20822 Contact with and (suspected) exposure to covid-19: Secondary | ICD-10-CM | POA: Diagnosis not present

## 2019-07-26 DIAGNOSIS — Z90411 Acquired partial absence of pancreas: Secondary | ICD-10-CM | POA: Insufficient documentation

## 2019-07-26 DIAGNOSIS — K861 Other chronic pancreatitis: Secondary | ICD-10-CM | POA: Insufficient documentation

## 2019-07-26 DIAGNOSIS — Z888 Allergy status to other drugs, medicaments and biological substances status: Secondary | ICD-10-CM | POA: Insufficient documentation

## 2019-07-26 DIAGNOSIS — E039 Hypothyroidism, unspecified: Secondary | ICD-10-CM | POA: Diagnosis present

## 2019-07-26 DIAGNOSIS — R1013 Epigastric pain: Secondary | ICD-10-CM | POA: Diagnosis present

## 2019-07-26 DIAGNOSIS — K8681 Exocrine pancreatic insufficiency: Secondary | ICD-10-CM | POA: Diagnosis not present

## 2019-07-26 DIAGNOSIS — F419 Anxiety disorder, unspecified: Secondary | ICD-10-CM | POA: Insufficient documentation

## 2019-07-26 DIAGNOSIS — I10 Essential (primary) hypertension: Secondary | ICD-10-CM | POA: Diagnosis not present

## 2019-07-26 DIAGNOSIS — K859 Acute pancreatitis without necrosis or infection, unspecified: Principal | ICD-10-CM | POA: Diagnosis present

## 2019-07-26 DIAGNOSIS — Z7982 Long term (current) use of aspirin: Secondary | ICD-10-CM | POA: Diagnosis not present

## 2019-07-26 DIAGNOSIS — K219 Gastro-esophageal reflux disease without esophagitis: Secondary | ICD-10-CM | POA: Insufficient documentation

## 2019-07-26 DIAGNOSIS — R110 Nausea: Secondary | ICD-10-CM | POA: Diagnosis present

## 2019-07-26 LAB — CBC
HCT: 34.1 % — ABNORMAL LOW (ref 36.0–46.0)
Hemoglobin: 11.3 g/dL — ABNORMAL LOW (ref 12.0–15.0)
MCH: 31 pg (ref 26.0–34.0)
MCHC: 33.1 g/dL (ref 30.0–36.0)
MCV: 93.4 fL (ref 80.0–100.0)
Platelets: 148 10*3/uL — ABNORMAL LOW (ref 150–400)
RBC: 3.65 MIL/uL — ABNORMAL LOW (ref 3.87–5.11)
RDW: 12 % (ref 11.5–15.5)
WBC: 5.3 10*3/uL (ref 4.0–10.5)
nRBC: 0 % (ref 0.0–0.2)

## 2019-07-26 LAB — URINALYSIS, ROUTINE W REFLEX MICROSCOPIC
Bacteria, UA: NONE SEEN
Bilirubin Urine: NEGATIVE
Glucose, UA: NEGATIVE mg/dL
Hgb urine dipstick: NEGATIVE
Ketones, ur: NEGATIVE mg/dL
Nitrite: NEGATIVE
Protein, ur: NEGATIVE mg/dL
Specific Gravity, Urine: 1.014 (ref 1.005–1.030)
pH: 6 (ref 5.0–8.0)

## 2019-07-26 LAB — COMPREHENSIVE METABOLIC PANEL
ALT: 95 U/L — ABNORMAL HIGH (ref 0–44)
AST: 219 U/L — ABNORMAL HIGH (ref 15–41)
Albumin: 3.7 g/dL (ref 3.5–5.0)
Alkaline Phosphatase: 99 U/L (ref 38–126)
Anion gap: 7 (ref 5–15)
BUN: 15 mg/dL (ref 8–23)
CO2: 24 mmol/L (ref 22–32)
Calcium: 9.1 mg/dL (ref 8.9–10.3)
Chloride: 107 mmol/L (ref 98–111)
Creatinine, Ser: 0.85 mg/dL (ref 0.44–1.00)
GFR calc Af Amer: >60 mL/min (ref >60)
GFR calc non Af Amer: >60 mL/min (ref >60)
Glucose, Bld: 126 mg/dL — ABNORMAL HIGH (ref 70–99)
Potassium: 3.4 mmol/L — ABNORMAL LOW (ref 3.5–5.1)
Sodium: 138 mmol/L (ref 135–145)
Total Bilirubin: 0.8 mg/dL (ref 0.3–1.2)
Total Protein: 7 g/dL (ref 6.5–8.1)

## 2019-07-26 LAB — LIPASE, BLOOD: Lipase: 294 U/L — ABNORMAL HIGH (ref 11–51)

## 2019-07-26 MED ORDER — SODIUM CHLORIDE 0.9% FLUSH: 3.0000 mL | Freq: Once | INTRAVENOUS | Status: DC

## 2019-07-26 NOTE — ED Triage Notes (Signed)
Patient reports intermittent mid abdominal pain with nausea onset this evening , no emesis or diarrhea , denies fever or chills .

## 2019-07-27 ENCOUNTER — Emergency Department (HOSPITAL_COMMUNITY): Payer: Managed Care, Other (non HMO)

## 2019-07-27 ENCOUNTER — Encounter (HOSPITAL_COMMUNITY): Payer: Self-pay | Admitting: Radiology

## 2019-07-27 DIAGNOSIS — K859 Acute pancreatitis without necrosis or infection, unspecified: Secondary | ICD-10-CM | POA: Diagnosis present

## 2019-07-27 DIAGNOSIS — K861 Other chronic pancreatitis: Secondary | ICD-10-CM | POA: Diagnosis not present

## 2019-07-27 DIAGNOSIS — E039 Hypothyroidism, unspecified: Secondary | ICD-10-CM | POA: Diagnosis not present

## 2019-07-27 DIAGNOSIS — E876 Hypokalemia: Secondary | ICD-10-CM | POA: Diagnosis present

## 2019-07-27 LAB — TSH: TSH: 2.341 u[IU]/mL (ref 0.350–4.500)

## 2019-07-27 LAB — SARS CORONAVIRUS 2 BY RT PCR (HOSPITAL ORDER, PERFORMED IN ~~LOC~~ HOSPITAL LAB): SARS Coronavirus 2: NEGATIVE

## 2019-07-27 MED ORDER — SODIUM CHLORIDE 0.9% FLUSH
3.0000 mL | Freq: Two times a day (BID) | INTRAVENOUS | Status: DC
  Administered 2019-07-27: 3 mL via INTRAVENOUS

## 2019-07-27 MED ORDER — ONDANSETRON HCL 4 MG/2ML IJ SOLN: 4.0000 mg | Freq: Four times a day (QID) | INTRAMUSCULAR | Status: DC | PRN

## 2019-07-27 MED ORDER — ENOXAPARIN SODIUM 40 MG/0.4ML ~~LOC~~ SOLN
40.0000 mg | SUBCUTANEOUS | Status: DC
  Administered 2019-07-27: 40 mg via SUBCUTANEOUS
  Filled 2019-07-27: qty 0.4

## 2019-07-27 MED ORDER — SODIUM CHLORIDE 0.9 % IV BOLUS
500.0000 mL | Freq: Once | INTRAVENOUS | Status: AC
  Administered 2019-07-27: 500 mL via INTRAVENOUS

## 2019-07-27 MED ORDER — LACTATED RINGERS IV SOLN: INTRAVENOUS | Status: DC

## 2019-07-27 MED ORDER — LEVOTHYROXINE SODIUM 25 MCG PO TABS: 25.0000 ug | ORAL_TABLET | Freq: Every day | ORAL | Status: DC

## 2019-07-27 MED ORDER — OXYCODONE HCL 5 MG PO TABS: 5.0000 mg | ORAL_TABLET | ORAL | Status: DC | PRN

## 2019-07-27 MED ORDER — ALPRAZOLAM 0.25 MG PO TABS: 0.2500 mg | ORAL_TABLET | Freq: Every evening | ORAL | Status: DC | PRN

## 2019-07-27 MED ORDER — IOHEXOL 300 MG/ML  SOLN
100.0000 mL | Freq: Once | INTRAMUSCULAR | Status: AC | PRN
  Administered 2019-07-27: 100 mL via INTRAVENOUS

## 2019-07-27 MED ORDER — POTASSIUM CHLORIDE CRYS ER 20 MEQ PO TBCR
40.0000 meq | EXTENDED_RELEASE_TABLET | Freq: Once | ORAL | Status: AC
  Administered 2019-07-27: 40 meq via ORAL
  Filled 2019-07-27: qty 2

## 2019-07-27 MED ORDER — LEVOTHYROXINE SODIUM 25 MCG PO TABS
25.0000 ug | ORAL_TABLET | Freq: Every day | ORAL | Status: DC
  Administered 2019-07-27: 25 ug via ORAL
  Filled 2019-07-27: qty 1

## 2019-07-27 NOTE — Assessment & Plan Note (Signed)
Check TSH.  Continue Synthroid. 

## 2019-07-27 NOTE — Discharge Summary (Signed)
Physician Discharge Summary  Felicia Obrien FTD:[id number] DOB: 11-20-1955 DOA: 07/26/2019  PCP: Catha Gosselin, MD  Admit date: 07/26/2019 Discharge date: 07/27/2019  Admitted From: home Discharge disposition: home   Recommendations for Outpatient Follow-Up:   #1.  Follow-up with primary care provider and/or primary gastroenterologist in 1 to 2 weeks for evaluation of symptoms/pancreatitis recommend cmet to track potassium level as well as ALT/AST #2.  Continue with soft diet.  Recommend small frequent meals advance as tolerated  Discharge Diagnosis:   Principal Problem:   Acute on chronic pancreatitis (HCC) Active Problems:   Hypertension   Hypokalemia   Hypothyroidism    Discharge Condition: Improved.  Diet recommendation: Soft foods  Wound care: None.  Code status: Full.   History of Present Illness:   Ms. Felicia Obrien is a 64 year old with a past medical history that includes chronic pancreatitis status post Whipple in March 2020 with diagnosis of well differentiated neuroendocrine tumor, hypertension, hyperlipidemia, GERD admitted June 1 for acute on chronic pancreatitis.  Patient's pain responded well to IV fluids and analgesics.  Discharge from the emergency department was discussed but patient apprehensive about her ability to maintain hydration and nourishment so observation admission recommended.   Hospital Course by Problem:   Acute on chronic pancreatitis.  Patient had a Whipple in March 2020.  He has chronic pancreatitis and is adept at managing this.  CT of the abdomen pelvis noted peripancreatic inflammation consistent with pancreatitis.  Lipase 297.  AST and ALT elevated.  Had nausea with no vomiting.  He was provided with antiemetics and IV fluids.  Pain quickly reached baseline.  Diet was advanced and at discharge tolerated soft diet and feeling much better.  Recommend follow-up with her PCP or primary gastroenterologist in 1 to 2 weeks for  evaluation of symptoms.  Complete metabolic panel to track potassium level as well as AST and ALT.  Hypokalemia.  Mild.  Likely related to decreased oral intake secondary to #1.  Potassium level was 3.4.  This was repleted.  Recommend labs in 1 week to ensure complete resolution.  At discharge patient tolerating p.o. liquids and food without problem  Hypertension.  Blood pressure high end of normal intermittently during this hospitalization.  Likely related to pain.  Home medications include atenolol.  Recommend resume home meds at discharge  Hypothyroidism.  TSH 2.3.  Home medications include Synthroid.  This will be continued at discharge    Medical Consultants:      Discharge Exam:   Vitals:   07/27/19 0530 07/27/19 0646  BP: (!) 143/77 132/63  Pulse: 64 61  Resp: 14 18  Temp:  97.6 F (36.4 C)  SpO2: 96% 98%   Vitals:   07/27/19 0500 07/27/19 0515 07/27/19 0530 07/27/19 0646  BP: (!) 158/89 130/72 (!) 143/77 132/63  Pulse: 73 61 64 61  Resp: 15 14 14 18   Temp:    97.6 F (36.4 C)  TempSrc:    Oral  SpO2: 99% 94% 96% 98%  Weight:    60.8 kg  Height:    5\' 2"  (1.575 m)    General exam: Appears calm and comfortable. No acute distress Respiratory system: Clear to auscultation. Respiratory effort normal. Cardiovascular system: S1 & S2 heard, RRR. No JVD,  rubs, gallops or clicks. No murmurs. Gastrointestinal system: Abdomen is nondistended, soft and nontender. No organomegaly or masses felt. Normal bowel sounds heard. Central nervous system: Alert and oriented. No focal neurological deficits. Extremities: No clubbing,  or  cyanosis. No edema. Skin: No rashes, lesions or ulcers. Psychiatry: Judgement and insight appear normal. Mood & affect appropriate.    The results of significant diagnostics from this hospitalization (including imaging, microbiology, ancillary and laboratory) are listed below for reference.     Procedures and Diagnostic Studies:   CT ABDOMEN  PELVIS W CONTRAST  Result Date: 07/27/2019 CLINICAL DATA:  Abdominal pain, central, prior pancreatic head mass status post Whipple EXAM: CT ABDOMEN AND PELVIS WITH CONTRAST TECHNIQUE: Multidetector CT imaging of the abdomen and pelvis was performed using the standard protocol following bolus administration of intravenous contrast. CONTRAST:  134mL OMNIPAQUE IOHEXOL 300 MG/ML  SOLN COMPARISON:  None. FINDINGS: Lower chest: The visualized heart size within normal limits. No pericardial fluid/thickening. No hiatal hernia. The visualized portions of the lungs are clear. Hepatobiliary: There is diffuse low density seen throughout the liver parenchyma. Main portal vein is patent. The patient is status post cholecystectomy. No biliary ductal dilation. Pancreas: The patient is status post Whipple with partial pancreatectomy. There appears to be fat stranding changes seen around the distal pancreatic body and tail. The inflammatory changes extend into the retroperitoneum. No loculated fluid collections however are noted. Spleen: Normal in size without focal abnormality. Adrenals/Urinary Tract: Both adrenal glands appear normal. The kidneys and collecting system appear normal without evidence of urinary tract calculus or hydronephrosis. Bladder is unremarkable. Stomach/Bowel: The stomach, small bowel, and colon are normal in appearance. No inflammatory changes, wall thickening, or obstructive findings. Diverticulosis without diverticulitis is noted. The appendix is normal. Vascular/Lymphatic: There are no enlarged mesenteric, retroperitoneal, or pelvic lymph nodes. Scattered aortic atherosclerotic calcifications are seen without aneurysmal dilatation. Reproductive: The uterus and adnexa are unremarkable. Other: No evidence of abdominal wall mass or hernia. Surgical clips seen within the mid abdomen. Musculoskeletal: No acute or significant osseous findings. IMPRESSION: Status post Whipple with inflammatory changes seen  surrounding the distal pancreatic tail, consistent with pancreatitis. No loculated fluid collections or evidence of pancreatic necrosis. Electronically Signed   By: Prudencio Pair M.D.   On: 07/27/2019 02:25     Labs:   Basic Metabolic Panel: Recent Labs  Lab 07/26/19 2151  NA 138  K 3.4*  CL 107  CO2 24  GLUCOSE 126*  BUN 15  CREATININE 0.85  CALCIUM 9.1   GFR Estimated Creatinine Clearance: 58.2 mL/min (by C-G formula based on SCr of 0.85 mg/dL). Liver Function Tests: Recent Labs  Lab 07/26/19 2151  AST 219*  ALT 95*  ALKPHOS 99  BILITOT 0.8  PROT 7.0  ALBUMIN 3.7   Recent Labs  Lab 07/26/19 2151  LIPASE 294*   No results for input(s): AMMONIA in the last 168 hours. Coagulation profile No results for input(s): INR, PROTIME in the last 168 hours.  CBC: Recent Labs  Lab 07/26/19 2151  WBC 5.3  HGB 11.3*  HCT 34.1*  MCV 93.4  PLT 148*   Cardiac Enzymes: No results for input(s): CKTOTAL, CKMB, CKMBINDEX, TROPONINI in the last 168 hours. BNP: Invalid input(s): POCBNP CBG: No results for input(s): GLUCAP in the last 168 hours. D-Dimer No results for input(s): DDIMER in the last 72 hours. Hgb A1c No results for input(s): HGBA1C in the last 72 hours. Lipid Profile No results for input(s): CHOL, HDL, LDLCALC, TRIG, CHOLHDL, LDLDIRECT in the last 72 hours. Thyroid function studies Recent Labs    07/27/19 0853  TSH 2.341   Anemia work up No results for input(s): VITAMINB12, FOLATE, FERRITIN, TIBC, IRON, RETICCTPCT in the last 23  hours. Microbiology Recent Results (from the past 240 hour(s))  SARS Coronavirus 2 by RT PCR (hospital order, performed in [facility] hospital lab) Nasopharyngeal Nasopharyngeal Swab     Status: None   Collection Time: 07/27/19  4:10 AM   Specimen: Nasopharyngeal Swab  Result Value Ref Range Status   SARS Coronavirus 2 NEGATIVE NEGATIVE Final    Comment: (NOTE) SARS-CoV-2 target nucleic acids are NOT DETECTED. The  SARS-CoV-2 RNA is generally detectable in upper and lower respiratory specimens during the acute phase of infection. The lowest concentration of SARS-CoV-2 viral copies this assay can detect is 250 copies / mL. A negative result does not preclude SARS-CoV-2 infection and should not be used as the sole basis for treatment or other patient management decisions.  A negative result may occur with improper specimen collection / handling, submission of specimen other than nasopharyngeal swab, presence of viral mutation(s) within the areas targeted by this assay, and inadequate number of viral copies (<250 copies / mL). A negative result must be combined with clinical observations, patient history, and epidemiological information. Fact Sheet for Patients:   BoilerBrush.com.cy Fact Sheet for Healthcare Providers: https://pope.com/ This test is not yet approved or cleared  by the [place] FDA and has been authorized for detection and/or diagnosis of SARS-CoV-2 by FDA under an Emergency Use Authorization (EUA).  This EUA will remain in effect (meaning this test can be used) for the duration of the COVID-19 declaration under Section 564(b)(1) of the Act, 21 U.S.C. section 360bbb-3(b)(1), unless the authorization is terminated or revoked sooner. Performed at [facility], [address], [place], [place] [address]      Discharge Instructions:   Discharge Instructions    Call MD for:  persistant nausea and vomiting   Complete by: As directed    Call MD for:  severe uncontrolled pain   Complete by: As directed    Call MD for:  temperature >100.4   Complete by: As directed    Diet - low sodium heart healthy   Complete by: As directed    Discharge instructions   Complete by: As directed    Follow up with PCP and/or GI in 1-2 weeks for evaluation of symptoms   Increase activity slowly   Complete by: As directed      Allergies as  of 07/27/2019      Reactions   Other Other (See Comments)   Dairy products cause painful red skin welts,  abdominal bloating, aching, constipation   Atorvastatin Other (See Comments)   Muscle cramps with dose over 10 mg      Medication List    TAKE these medications   ALPRAZolam 0.25 MG tablet Commonly known as: XANAX Take 0.25 mg by mouth at bedtime as needed for anxiety.   aspirin 81 MG tablet Take 81 mg by mouth daily.   atenolol 50 MG tablet Commonly known as: TENORMIN Take 50 mg by mouth daily.   atorvastatin 10 MG tablet Commonly known as: LIPITOR Take 10 mg by mouth daily.   Biotin 2500 MCG Caps Take 2 chews by mouth daily.   levothyroxine 25 MCG tablet Commonly known as: SYNTHROID Take 25 mcg by mouth daily before breakfast.   MAGNESIUM PO Take 166 mg by mouth daily. Gummies   Multi-Vitamins Tabs Take 1 tablet by mouth daily.   omeprazole 20 MG tablet Commonly known as: PRILOSEC OTC Take 20 mg by mouth daily.   Probiotic Daily Caps Take 1 capsule by mouth  daily.   RA Krill Oil Caps Take 1 capsule by mouth daily.   VITA-C PO Take 1 tablet by mouth daily.   VITAMIN B COMPLEX PO Take 1 tablet by mouth daily.   Vitamin D 50 MCG (2000 UT) Caps Take 2,000 Units by mouth daily.         Time coordinating discharge: 40 minutes  Signed:  Gwenyth Bender NP  [facility] 07/27/2019, 1:35 PM

## 2019-07-27 NOTE — ED Provider Notes (Signed)
[facility] EMERGENCY DEPARTMENT Provider Note   CSN: [id number] Arrival date & time: 07/26/19  2051     History Chief Complaint  Patient presents with  . Abdominal Pain    Felicia Obrien is a 64 y.o. female.  Patient presents to the emergency department for evaluation of abdominal pain.  Patient reports that she has been having intermittent mid abdominal pain.  Pain started earlier today.  Pain waxes and wanes, at times is quite severe.  It is associated with nausea but no vomiting.  She has not had any diarrhea.  Patient denies fever and chills.  Pain currently is mild to moderate.  Patient reports a history of previous Whipple procedure and chronic pancreatitis.        Past Medical History:  Diagnosis Date  . Abnormal Pap smear of cervix   . Chronic pancreatitis ([place])   . GERD (gastroesophageal reflux disease)   . Hyperlipidemia   . Hypertension   . PAC (premature atrial contraction)     Patient Active Problem List   Diagnosis Date Noted  . Pancreas divisum 01/07/2016  . Status post endoscopic retrograde cholangiopancreatography 01/07/2016  . Increased anion gap metabolic acidosis 78/29/5621  . Dehydration, moderate 01/07/2016  . HLD (hyperlipidemia) 01/07/2016  . Acute recurrent pancreatitis 01/07/2016  . Pancreatitis 12/20/2015  . Hypertension 12/20/2015  . Chest pain 12/20/2015  . Bradycardia 12/20/2015    Past Surgical History:  Procedure Laterality Date  . CERVICAL CONE BIOPSY       OB History   No obstetric history on file.     Family History  Problem Relation Age of Onset  . Cancer Mother   . Breast cancer Mother 50  . Hyperlipidemia Father   . Hypertension Father   . Heart disease Father   . Hyperlipidemia Sister   . Hyperlipidemia Brother   . Hyperlipidemia Brother     Social History   Tobacco Use  . Smoking status: Never Smoker  . Smokeless tobacco: Never Used  Substance Use Topics  . Alcohol use: No  . Drug  use: No    Home Medications Prior to Admission medications   Medication Sig Start Date End Date Taking? Authorizing Provider  ALPRAZolam Duanne Moron) 0.25 MG tablet Take 0.25 mg by mouth at bedtime as needed for anxiety.   Yes [provider]  Ascorbic Acid (VITA-C PO) Take 1 tablet by mouth daily.   Yes [provider]  aspirin 81 MG tablet Take 81 mg by mouth daily.   Yes [provider]  atenolol (TENORMIN) 50 MG tablet Take 50 mg by mouth daily.    Yes [provider]  atorvastatin (LIPITOR) 10 MG tablet Take 10 mg by mouth daily.   Yes [provider]  B Complex Vitamins (VITAMIN B COMPLEX PO) Take 1 tablet by mouth daily.   Yes [provider]  Biotin 2500 MCG CAPS Take 2 chews by mouth daily.   Yes [provider]  Cholecalciferol (VITAMIN D) 2000 UNITS CAPS Take 2,000 Units by mouth daily.    Yes [provider]  levothyroxine (SYNTHROID) 25 MCG tablet Take 25 mcg by mouth daily before breakfast.   Yes [provider]  MAGNESIUM PO Take 166 mg by mouth daily. Gummies   Yes [provider]  Multiple Vitamin (MULTI-VITAMINS) TABS Take 1 tablet by mouth daily.   Yes [provider]  omeprazole (PRILOSEC OTC) 20 MG tablet Take 20 mg by mouth daily.   Yes [provider]  Probiotic Product (PROBIOTIC DAILY) CAPS Take 1 capsule by mouth daily.   Yes [provider]  RA KRILL OIL CAPS Take 1 capsule by mouth daily.    Yes [provider]    Allergies    Other and Atorvastatin  Review of Systems   Review of Systems  Gastrointestinal: Positive for abdominal pain and nausea.  All other systems reviewed and are negative.   Physical Exam Updated Vital Signs BP (!) 146/65 (BP Location: Right Arm)   Pulse 61   Temp (!) 97.5 F (36.4 C) (Oral)   Resp 14   Ht 5\' 2"  (1.575 m)   Wt 65 kg   SpO2 100%   BMI 26.21 kg/m   Physical Exam Vitals and nursing note  reviewed.  Constitutional:      General: She is not in acute distress.    Appearance: Normal appearance. She is well-developed.  HENT:     Head: Normocephalic and atraumatic.     Right Ear: Hearing normal.     Left Ear: Hearing normal.     Nose: Nose normal.  Eyes:     Conjunctiva/sclera: Conjunctivae normal.     Pupils: Pupils are equal, round, and reactive to light.  Cardiovascular:     Rate and Rhythm: Regular rhythm.     Heart sounds: S1 normal and S2 normal. No murmur. No friction rub. No gallop.   Pulmonary:     Effort: Pulmonary effort is normal. No respiratory distress.     Breath sounds: Normal breath sounds.  Chest:     Chest wall: No tenderness.  Abdominal:     General: Bowel sounds are normal.     Palpations: Abdomen is soft.     Tenderness: There is abdominal tenderness in the epigastric area. There is no guarding or rebound. Negative signs include Murphy's sign and McBurney's sign.     Hernia: No hernia is present.  Musculoskeletal:        General: Normal range of motion.     Cervical back: Normal range of motion and neck supple.  Skin:    General: Skin is warm and dry.     Findings: No rash.  Neurological:     Mental Status: She is alert and oriented to person, place, and time.     GCS: GCS eye subscore is 4. GCS verbal subscore is 5. GCS motor subscore is 6.     Cranial Nerves: No cranial nerve deficit.     Sensory: No sensory deficit.     Coordination: Coordination normal.  Psychiatric:        Speech: Speech normal.        Behavior: Behavior normal.        Thought Content: Thought content normal.     ED Results / Procedures / Treatments   Labs (all labs ordered are listed, but only abnormal results are displayed) Labs Reviewed  LIPASE, BLOOD - Abnormal; Notable for the following components:      Result Value   Lipase 294 (*)    All other components within normal limits  COMPREHENSIVE METABOLIC PANEL - Abnormal; Notable for the following components:    Potassium 3.4 (*)    Glucose, Bld 126 (*)    AST 219 (*)    ALT 95 (*)    All other components within normal limits  CBC - Abnormal; Notable for the following components:   RBC 3.65 (*)    Hemoglobin 11.3 (*)    HCT 34.1 (*)  Platelets 148 (*)    All other components within normal limits  URINALYSIS, ROUTINE W REFLEX MICROSCOPIC - Abnormal; Notable for the following components:   Leukocytes,Ua TRACE (*)    All other components within normal limits    EKG None  Radiology CT ABDOMEN PELVIS W CONTRAST  Result Date: 07/27/2019 CLINICAL DATA:  Abdominal pain, central, prior pancreatic head mass status post Whipple EXAM: CT ABDOMEN AND PELVIS WITH CONTRAST TECHNIQUE: Multidetector CT imaging of the abdomen and pelvis was performed using the standard protocol following bolus administration of intravenous contrast. CONTRAST:  OMNIPAQUE IOHEXOL 300 MG/ML  SOLN COMPARISON:  None. FINDINGS: Lower chest: The visualized heart size within normal limits. No pericardial fluid/thickening. No hiatal hernia. The visualized portions of the lungs are clear. Hepatobiliary: There is diffuse low density seen throughout the liver parenchyma. Main portal vein is patent. The patient is status post cholecystectomy. No biliary ductal dilation. Pancreas: The patient is status post Whipple with partial pancreatectomy. There appears to be fat stranding changes seen around the distal pancreatic body and tail. The inflammatory changes extend into the retroperitoneum. No loculated fluid collections however are noted. Spleen: Normal in size without focal abnormality. Adrenals/Urinary Tract: Both adrenal glands appear normal. The kidneys and collecting system appear normal without evidence of urinary tract calculus or hydronephrosis. Bladder is unremarkable. Stomach/Bowel: The stomach, small bowel, and colon are normal in appearance. No inflammatory changes, wall thickening, or obstructive findings. Diverticulosis  without diverticulitis is noted. The appendix is normal. Vascular/Lymphatic: There are no enlarged mesenteric, retroperitoneal, or pelvic lymph nodes. Scattered aortic atherosclerotic calcifications are seen without aneurysmal dilatation. Reproductive: The uterus and adnexa are unremarkable. Other: No evidence of abdominal wall mass or hernia. Surgical clips seen within the mid abdomen. Musculoskeletal: No acute or significant osseous findings. IMPRESSION: Status post Whipple with inflammatory changes seen surrounding the distal pancreatic tail, consistent with pancreatitis. No loculated fluid collections or evidence of pancreatic necrosis. Electronically Signed   By: Jonna Clark M.D.   On: 07/27/2019 02:25    Procedures Procedures (including critical care time)  Medications Ordered in ED Medications  sodium chloride flush (NS) 0.9 % injection 3 mL (has no administration in time range)  sodium chloride 0.9 % bolus 500 mL (500 mLs Intravenous New Bag/Given 07/27/19 0137)  iohexol (OMNIPAQUE) 300 MG/ML solution 100 mL (100 mLs Intravenous Contrast Given 07/27/19 0140)    ED Course  I have reviewed the triage vital signs and the nursing notes.  Pertinent labs & imaging results that were available during my care of the patient were reviewed by me and considered in my medical decision making (see chart for details).    MDM Rules/Calculators/A&P                     Patient presents to the emergency department with epigastric abdominal pain.  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  She has had a Whipple procedure.  She developed pain today and has an elevated lipase.  CT scan does show if amatory changes in the tail the pancreas consistent with acute pancreatitis without complicating features.  Based on patient's extensive history, will recommend observation for pain control, IV fluids,  n.p.o. status and monitoring.  Final Clinical Impression(s) / ED Diagnoses Final diagnoses:  Acute pancreatitis without infection or necrosis, unspecified pancreatitis type    Rx / DC Orders ED Discharge Orders  None       Gilda Crease, MD 07/27/19 0301

## 2019-07-27 NOTE — Assessment & Plan Note (Signed)
-   monitor and treat further as needed

## 2019-07-27 NOTE — Hospital Course (Signed)
Felicia Obrien is a 64 yo CF with PMH HTN, HLD, GERD, chronic pancreatitis s/p Whipple March 2020 with diagnosis of well differentiated neuroendocrine tumor (followed by [facility]) who presented to the ER with worsening abdominal pain since yesterday. She has been nauseous with no vomiting and no diarrhea along with worsening abdominal pain in her upper middle abdomen that has now changed to her lower abdomen on both sides.  Due to this, she presented to the ER for evaluation. Lipase, 294 Sodium 138, K3.4, AST 219, ALT 95 WBC 5.3, Hgb 11.3, HCT 34.1, platelets 148 CT abdomen/pelvis performed in the ER revealing changes consistent with Whipple along with inflammatory changes surrounding distal pancreatic tail consistent with pancreatitis.  No fluid collections or necrosis appreciated. Her biggest concern in returning home was her fear of eating/drinking that might cause vomiting or worsening of her abdominal pain.  For this, she was admitted for observation, IV fluids, and further symptomatic treatment as needed.

## 2019-07-27 NOTE — Progress Notes (Signed)
Patient admitted to room 6N02 from ED. Alert and oriented x4. Vital signs stable. Denies c/o pain. Oriented to room and remote.

## 2019-07-27 NOTE — Assessment & Plan Note (Signed)
-   lipase again elevated; patient now s/p Whipple 04/2018. CT abd/pelvis noted peripancreatic inflammation c/w pancreatitis; symptoms include worsening nausea and pain at home - clinically consistent with pancreatis and given her fear of eating/drinking at this time, warranted to admit and continue IVF, anti-emetics, and pain control if needed -continue IVF - continue PRN pain meds and zofran - advance diet as patient indicates

## 2019-07-27 NOTE — Progress Notes (Signed)
Patient discharged to home with instructions. 

## 2019-07-27 NOTE — H&P (Signed)
History and Physical    Felicia Obrien  WPY:[id number]  DOB: 1955/06/27  DOA: 07/26/2019  PCP: Hulan Fess, MD Patient coming from: home  Chief Complaint: Nausea, abdominal pain  HPI:  Felicia Obrien is a 64 yo CF with PMH HTN, HLD, GERD, chronic pancreatitis s/p Whipple March 2020 with diagnosis of well differentiated neuroendocrine tumor (followed by [facility]) who presented to the ER with worsening abdominal pain since yesterday. She has been nauseous with no vomiting and no diarrhea along with worsening abdominal pain in her upper middle abdomen that has now changed to her lower abdomen on both sides.  Due to this, she presented to the ER for evaluation. Lipase, 294 Sodium 138, K3.4, AST 219, ALT 95 WBC 5.3, Hgb 11.3, HCT 34.1, platelets 148 CT abdomen/pelvis performed in the ER revealing changes consistent with Whipple along with inflammatory changes surrounding distal pancreatic tail consistent with pancreatitis.  No fluid collections or necrosis appreciated. Her biggest concern in returning home was her fear of eating/drinking that might cause vomiting or worsening of her abdominal pain.  For this, she was admitted for observation, IV fluids, and further symptomatic treatment as needed.   I have personally briefly reviewed patient's old medical records in [place]  Assessment/Plan: Acute on chronic pancreatitis ([place]) - lipase again elevated; patient now s/p Whipple 04/2018. CT abd/pelvis noted peripancreatic inflammation c/w pancreatitis; symptoms include worsening nausea and pain at home - clinically consistent with pancreatis and given her fear of eating/drinking at this time, warranted to admit and continue IVF, anti-emetics, and pain control if needed -continue IVF - continue PRN pain meds and zofran - advance diet as patient indicates   Hypertension - monitor and treat further as needed  Hypothyroidism -Check TSH -Continue Synthroid   Code Status: full DVT  Prophylaxis:enoxaparin (Lovenox) 40mg  SQ 2 hours prior to surgery then every day Anticipated disposition is to home  History: Past Medical History:  Diagnosis Date  . Abnormal Pap smear of cervix   . Chronic pancreatitis ([place])   . GERD (gastroesophageal reflux disease)   . Hyperlipidemia   . Hypertension   . PAC (premature atrial contraction)     Past Surgical History:  Procedure Laterality Date  . CERVICAL CONE BIOPSY       reports that she has never smoked. She has never used smokeless tobacco. She reports that she does not drink alcohol or use drugs.  Allergies  Allergen Reactions  . Other Other (See Comments)    Dairy products cause painful red skin welts,  abdominal bloating, aching, constipation  . Atorvastatin Other (See Comments)    Muscle cramps with dose over 10 mg     Family History  Problem Relation Age of Onset  . Cancer Mother   . Breast cancer Mother 47  . Hyperlipidemia Father   . Hypertension Father   . Heart disease Father   . Hyperlipidemia Sister   . Hyperlipidemia Brother   . Hyperlipidemia Brother    Home Medications: Prior to Admission medications   Medication Sig Start Date End Date Taking? Authorizing Provider  ALPRAZolam Duanne Moron) 0.25 MG tablet Take 0.25 mg by mouth at bedtime as needed for anxiety.   Yes [provider]  Ascorbic Acid (VITA-C PO) Take 1 tablet by mouth daily.   Yes [provider]  aspirin 81 MG tablet Take 81 mg by mouth daily.   Yes [provider]  atenolol (TENORMIN) 50 MG tablet Take 50 mg by mouth daily.  Yes [provider]  atorvastatin (LIPITOR) 10 MG tablet Take 10 mg by mouth daily.   Yes [provider]  B Complex Vitamins (VITAMIN B COMPLEX PO) Take 1 tablet by mouth daily.   Yes [provider]  Biotin 2500 MCG CAPS Take 2 chews by mouth daily.   Yes [provider]  Cholecalciferol (VITAMIN D) 2000 UNITS CAPS Take 2,000 Units by mouth daily.     Yes [provider]  levothyroxine (SYNTHROID) 25 MCG tablet Take 25 mcg by mouth daily before breakfast.   Yes [provider]  MAGNESIUM PO Take 166 mg by mouth daily. Gummies   Yes [provider]  Multiple Vitamin (MULTI-VITAMINS) TABS Take 1 tablet by mouth daily.   Yes [provider]  omeprazole (PRILOSEC OTC) 20 MG tablet Take 20 mg by mouth daily.   Yes [provider]  Probiotic Product (PROBIOTIC DAILY) CAPS Take 1 capsule by mouth daily.   Yes [provider]  RA KRILL OIL CAPS Take 1 capsule by mouth daily.    Yes [provider]    Review of Systems:  Pertinent items noted in HPI and remainder of comprehensive ROS otherwise negative.  Physical Exam: Vitals:   07/27/19 0445 07/27/19 0500 07/27/19 0515 07/27/19 0530  BP: 130/71 (!) 158/89 130/72 (!) 143/77  Pulse: (!) 58 73 61 64  Resp: 13 15 14 14   Temp:      TempSrc:      SpO2: 96% 99% 94% 96%  Weight:      Height:       General appearance: alert, cooperative and no distress Head: Normocephalic, without obvious abnormality Eyes: EOMI Lungs: clear to auscultation bilaterally Heart: regular rate and rhythm and S1, S2 normal Abdomen: TTP in lower quadrants bilaterally, no R/G. BS present Extremities: no edema Skin: warm, dry, intact Neurologic: Grossly normal  Labs on Admission:  I have personally reviewed following labs and imaging studies Results for orders placed or performed during the hospital encounter of 07/26/19 (from the past 24 hour(s))  Urinalysis, Routine w reflex microscopic     Status: Abnormal   Collection Time: 07/26/19  9:40 PM  Result Value Ref Range   Color, Urine YELLOW YELLOW   APPearance CLEAR CLEAR   Specific Gravity, Urine 1.014 1.005 - 1.030   pH 6.0 5.0 - 8.0   Glucose, UA NEGATIVE NEGATIVE mg/dL   Hgb urine dipstick NEGATIVE NEGATIVE   Bilirubin Urine NEGATIVE NEGATIVE   Ketones, ur NEGATIVE NEGATIVE mg/dL   Protein, ur  NEGATIVE NEGATIVE mg/dL   Nitrite NEGATIVE NEGATIVE   Leukocytes,Ua TRACE (A) NEGATIVE   RBC / HPF 0-5 0 - 5 RBC/hpf   WBC, UA 0-5 0 - 5 WBC/hpf   Bacteria, UA NONE SEEN NONE SEEN   Squamous Epithelial / LPF 0-5 0 - 5   Mucus PRESENT   Lipase, blood     Status: Abnormal   Collection Time: 07/26/19  9:51 PM  Result Value Ref Range   Lipase 294 (H) 11 - 51 U/L  Comprehensive metabolic panel     Status: Abnormal   Collection Time: 07/26/19  9:51 PM  Result Value Ref Range   Sodium 138 135 - 145 mmol/L   Potassium 3.4 (L) 3.5 - 5.1 mmol/L   Chloride 107 98 - 111 mmol/L   CO2 24 22 - 32 mmol/L   Glucose, Bld 126 (H) 70 - 99 mg/dL   BUN 15 8 - 23 mg/dL  Creatinine, Ser 0.85 0.44 - 1.00 mg/dL   Calcium 9.1 8.9 - 93.7 mg/dL   Total Protein 7.0 6.5 - 8.1 g/dL   Albumin 3.7 3.5 - 5.0 g/dL   AST 169 (H) 15 - 41 U/L   ALT 95 (H) 0 - 44 U/L   Alkaline Phosphatase 99 38 - 126 U/L   Total Bilirubin 0.8 0.3 - 1.2 mg/dL   GFR calc non Af Amer >60 >60 mL/min   GFR calc Af Amer >60 >60 mL/min   Anion gap 7 5 - 15  CBC     Status: Abnormal   Collection Time: 07/26/19  9:51 PM  Result Value Ref Range   WBC 5.3 4.0 - 10.5 K/uL   RBC 3.65 (L) 3.87 - 5.11 MIL/uL   Hemoglobin 11.3 (L) 12.0 - 15.0 g/dL   HCT 67.8 (L) 93.8 - 10.1 %   MCV 93.4 80.0 - 100.0 fL   MCH 31.0 26.0 - 34.0 pg   MCHC 33.1 30.0 - 36.0 g/dL   RDW 75.1 02.5 - 85.2 %   Platelets 148 (L) 150 - 400 K/uL   nRBC 0.0 0.0 - 0.2 %     Radiological Exams on Admission: CT ABDOMEN PELVIS W CONTRAST  Result Date: 07/27/2019 CLINICAL DATA:  Abdominal pain, central, prior pancreatic head mass status post Whipple EXAM: CT ABDOMEN AND PELVIS WITH CONTRAST TECHNIQUE: Multidetector CT imaging of the abdomen and pelvis was performed using the standard protocol following bolus administration of intravenous contrast. CONTRAST:  OMNIPAQUE IOHEXOL 300 MG/ML  SOLN COMPARISON:  None. FINDINGS: Lower chest: The visualized heart size within  normal limits. No pericardial fluid/thickening. No hiatal hernia. The visualized portions of the lungs are clear. Hepatobiliary: There is diffuse low density seen throughout the liver parenchyma. Main portal vein is patent. The patient is status post cholecystectomy. No biliary ductal dilation. Pancreas: The patient is status post Whipple with partial pancreatectomy. There appears to be fat stranding changes seen around the distal pancreatic body and tail. The inflammatory changes extend into the retroperitoneum. No loculated fluid collections however are noted. Spleen: Normal in size without focal abnormality. Adrenals/Urinary Tract: Both adrenal glands appear normal. The kidneys and collecting system appear normal without evidence of urinary tract calculus or hydronephrosis. Bladder is unremarkable. Stomach/Bowel: The stomach, small bowel, and colon are normal in appearance. No inflammatory changes, wall thickening, or obstructive findings. Diverticulosis without diverticulitis is noted. The appendix is normal. Vascular/Lymphatic: There are no enlarged mesenteric, retroperitoneal, or pelvic lymph nodes. Scattered aortic atherosclerotic calcifications are seen without aneurysmal dilatation. Reproductive: The uterus and adnexa are unremarkable. Other: No evidence of abdominal wall mass or hernia. Surgical clips seen within the mid abdomen. Musculoskeletal: No acute or significant osseous findings. IMPRESSION: Status post Whipple with inflammatory changes seen surrounding the distal pancreatic tail, consistent with pancreatitis. No loculated fluid collections or evidence of pancreatic necrosis. Electronically Signed   By: Jonna Clark M.D.   On: 07/27/2019 02:25   CT ABDOMEN PELVIS W CONTRAST  Final Result      Consults called:  n/a    Lewie Chamber, MD [facility] Pager: Secure chat  If 7PM-7AM, please contact night-coverage [email or web address] Use universal [place] password for that web site.  If you do not have the password, please call the hospital operator.  07/27/2019, 6:05 AM

## 2019-12-20 ENCOUNTER — Telehealth: Payer: Self-pay | Admitting: Interventional Cardiology

## 2019-12-20 MED ORDER — METOPROLOL SUCCINATE ER 50 MG PO TB24
50.0000 mg | ORAL_TABLET | Freq: Every day | ORAL | 3 refills | Status: DC
Start: 2019-12-20 — End: 2020-11-21

## 2019-12-20 NOTE — Telephone Encounter (Signed)
Pt states over the last 10 days she has noticed an increase in frequency and intensity of her palpitations.  Currently on Atenolol 50mg  QD with no missed doses.  Denies medication changes or increased caffeine.  Seem to be worse mid day or if anxious.  Resting and working in the yard both help to alleviate these.  Denies that palps keep her up at night.  Denies SOB, CP, lightheadedness, dizziness or presyncope.  Advised I will send to Dr. for review and advisement.

## 2019-12-20 NOTE — Telephone Encounter (Signed)
Change Tenormin to Toprol-XL 50 mg/day

## 2019-12-20 NOTE — Telephone Encounter (Signed)
Patient c/o Palpitations:  High priority if patient c/o lightheadedness, shortness of breath, or chest pain  1) How long have you had palpitations/irregular HR/ Afib? Are you having the symptoms now? Patient states she has been experiencing PVCs - no symptoms currently  2) Are you currently experiencing lightheadedness, SOB or CP? No   3) Do you have a history of afib (atrial fibrillation) or irregular heart rhythm? No   4) Have you checked your BP or HR? (document readings if available): No   5) Are you experiencing any other symptoms? No

## 2019-12-20 NOTE — Telephone Encounter (Signed)
Spoke with pt and reviewed recommendations. Pt verbalized understanding and was in agreement with plan.  

## 2019-12-20 NOTE — Telephone Encounter (Signed)
The fact that the palpitations improve with activity suggests a benign process. If it continues to give trouble, she will need to wear a monitor for least a couple weeks to identify the source.  I cannot find that we have ever done a monitor in the past.  Please let me know if she has additional information concerning this.

## 2019-12-20 NOTE — Telephone Encounter (Signed)
Patient was returning Jennifer's Call

## 2019-12-20 NOTE — Telephone Encounter (Signed)
Pt wore a monitor in January 2019 and this was your interpretation:   Lyn Records, MD  04/18/2017 8:38 AM EST     Let the patient know the monitor is without severe abnormality. Premature beats account for "flutter". No atrial fibrillation is identified. No changes are required in absence of other complaints.  A copy will be sent to Catha Gosselin, MD A with no E. coli locations

## 2019-12-20 NOTE — Telephone Encounter (Signed)
Attempted to contact pt.  VM picked up but is full.

## 2020-01-18 NOTE — Progress Notes (Signed)
Cardiology Office Note:    Date:  01/19/2020   ID:  Felicia Obrien, DOB Dec 13, 1955, MRN [id number]  PCP:  Catha Gosselin, MD  Cardiologist:  Lesleigh Noe, MD   Referring MD: Catha Gosselin, MD   Chief Complaint  Patient presents with  . Hypertension  . Advice Only    Palpitations    History of Present Illness:    Felicia Obrien is a 64 y.o. female with a hx of palpitations, hyperlipidemia, and hypertension.   She is doing well.  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 Freida Busman at [facility] on 04/29/2018.Marland Kitchen  She has recovered completely.  She appears to have a good out look/prognosis.  From cardiac standpoint, she occasionally has palpitations but generally well controlled.  Has had some difficulty with mild blood pressure elevation but also generally under good control.  She is currently taking metoprolol succinate 50 mg/day.  No medication side effects.  Past Medical History:  Diagnosis Date  . Abnormal Pap smear of cervix   . Chronic pancreatitis (HCC)   . GERD (gastroesophageal reflux disease)   . Hyperlipidemia   . Hypertension   . PAC (premature atrial contraction)     Past Surgical History:  Procedure Laterality Date  . CERVICAL CONE BIOPSY    . WHIPPLE PROCEDURE  04/2018    Current Medications: Current Meds  Medication Sig  . ALPRAZolam (XANAX) 0.25 MG tablet Take 0.25 mg by mouth at bedtime as needed for anxiety.  . Ascorbic Acid (VITA-C PO) Take 1 tablet by mouth daily.  Marland Kitchen aspirin 81 MG tablet Take 81 mg by mouth daily.  Marland Kitchen atorvastatin (LIPITOR) 10 MG tablet Take 10 mg by mouth daily.  . B Complex Vitamins (VITAMIN B COMPLEX PO) Take 1 tablet by mouth daily.  . Biotin 2500 MCG CAPS Take 2 chews by mouth daily.  . Cholecalciferol (VITAMIN D) 2000 UNITS CAPS Take 2,000 Units by mouth daily.   Marland Kitchen  levothyroxine (SYNTHROID) 25 MCG tablet Take 25 mcg by mouth daily before breakfast.  . MAGNESIUM PO Take 166 mg by mouth daily. Gummies  . metoprolol succinate (TOPROL-XL) 50 MG 24 hr tablet Take 1 tablet (50 mg total) by mouth daily. Take with or immediately following a meal.  . Multiple Vitamin (MULTI-VITAMINS) TABS Take 1 tablet by mouth daily.  Marland Kitchen omeprazole (PRILOSEC OTC) 20 MG tablet Take 20 mg by mouth daily.  . Probiotic Product (PROBIOTIC DAILY) CAPS Take 1 capsule by mouth daily.  Marland Kitchen RA KRILL OIL CAPS Take 1 capsule by mouth daily.      Allergies:   Other and Atorvastatin   Social History   Socioeconomic History  . Marital status: Married    Spouse name: Not on file  . Number of children: Not on file  . Years of education: Not on file  . Highest education level: Not on file  Occupational History  . Not on file  Tobacco Use  . Smoking status: Never Smoker  . Smokeless tobacco: Never Used  Vaping Use  . Vaping Use: Never used  Substance and Sexual Activity  . Alcohol use: No  . Drug use: No  . Sexual activity: Not on file  Other Topics Concern  . Not on file  Social History Narrative  . Not on file   Social Determinants of Health   Financial Resource Strain:   .  Difficulty of Paying Living Expenses: Not on file  Food Insecurity:   . Worried About Programme researcher, broadcasting/film/video in the Last Year: Not on file  . Ran Out of Food in the Last Year: Not on file  Transportation Needs:   . Lack of Transportation (Medical): Not on file  . Lack of Transportation (Non-Medical): Not on file  Physical Activity:   . Days of Exercise per Week: Not on file  . Minutes of Exercise per Session: Not on file  Stress:   . Feeling of Stress : Not on file  Social Connections:   . Frequency of Communication with Friends and Family: Not on file  . Frequency of Social Gatherings with Friends and Family: Not on file  . Attends Religious Services: Not on file  . Active Member of Clubs or  Organizations: Not on file  . Attends Banker Meetings: Not on file  . Marital Status: Not on file     Family History: The patient's family history includes Breast cancer (age of onset: 58) in her mother; Cancer in her mother; Heart disease in her father; Hyperlipidemia in her brother, brother, father, and sister; Hypertension in her father.  ROS:   Please see the history of present illness.    No chest pain, prolonged palpitations, edema, lightheadedness, syncope, or dizziness.  All other systems reviewed and are negative.  EKGs/Labs/Other Studies Reviewed:    The following studies were reviewed today: Long-term monitor February 2019: Study Highlights    Sinus rhythm and sinus bradycardia.  Occasional PACs and PVC that correlate with complaint of "flutter" on several occasions.  EKG:  EKG Jul 16, 2019, demonstrates sinus bradycardia with nonspecific ST abnormality.  Recent Labs: 07/26/2019: ALT 95; BUN 15; Creatinine, Ser 0.85; Hemoglobin 11.3; Platelets 148; Potassium 3.4; Sodium 138 07/27/2019: TSH 2.341  Recent Lipid Panel    Component Value Date/Time   CHOL 133 01/08/2016 0730   TRIG 69 01/08/2016 0730   HDL 33 (L) 01/08/2016 0730   CHOLHDL 4.0 01/08/2016 0730   VLDL 14 01/08/2016 0730   LDLCALC 86 01/08/2016 0730    Physical Exam:    VS:  BP (!) 145/93   Pulse 89   Temp (!) 97.3 F (36.3 C)   Ht 5\' 2"  (1.575 m)   Wt 135 lb 3.2 oz (61.3 kg)   SpO2 95%   BMI 24.73 kg/m     Wt Readings from Last 3 Encounters:  01/19/20 135 lb 3.2 oz (61.3 kg)  07/27/19 134 lb (60.8 kg)  07/06/19 136 lb 6.4 oz (61.9 kg)     GEN: Appears younger than stated age.  Weight is stable.. No acute distress HEENT: Normal NECK: No JVD. LYMPHATICS: No lymphadenopathy CARDIAC:  RRR without murmur, gallop, or edema. VASCULAR:  Normal Pulses. No bruits. RESPIRATORY:  Clear to auscultation without rales, wheezing or rhonchi  ABDOMEN: Soft, non-tender, non-distended, No  pulsatile mass, MUSCULOSKELETAL: No deformity  SKIN: Warm and dry NEUROLOGIC:  Alert and oriented x 3 PSYCHIATRIC:  Normal affect   ASSESSMENT:    1. Essential hypertension   2. PAC (premature atrial contraction)   3. Mixed hyperlipidemia   4. Educated about COVID-19 virus infection    PLAN:    In order of problems listed above:  1. Borderline.  Discussed pharmacologic and nonpharmacologic measures to control blood pressure.  We concentrated on nonpharmacology today.  2300 mg sodium restriction, moderate aerobic activity, avoidance of nonsteroidals, 6 hours of sleep or more daily, avoid  alcohol, among other.  She is not overweight. 2. Rare, and seemingly controlled by relatively low-dose metoprolol. 3. Continue low intensity statin therapy.  Most recent LDL was 86 and September 2020 on 10 mg of Lipitor per day. 4. Vaccinated and practicing social distancing.   Medication Adjustments/Labs and Tests Ordered: Current medicines are reviewed at length with the patient today.  Concerns regarding medicines are outlined above.  No orders of the defined types were placed in this encounter.  No orders of the defined types were placed in this encounter.   Patient Instructions  Medication Instructions:  Your physician recommends that you continue on your current medications as directed. Please refer to the Current Medication list given to you today.  *If you need a refill on your cardiac medications before your next appointment, please call your pharmacy*   Lab Work: None If you have labs (blood work) drawn today and your tests are completely normal, you will receive your results only by: Marland Kitchen MyChart Message (if you have MyChart) OR . A paper copy in the mail If you have any lab test that is abnormal or we need to change your treatment, we will call you to review the results.   Testing/Procedures: None   Follow-Up: At [facility], you and your health needs are our priority.  As  part of our continuing mission to provide you with exceptional heart care, we have created designated Provider Care Teams.  These Care Teams include your primary Cardiologist (physician) and Advanced Practice Providers (APPs -  Physician Assistants and Nurse Practitioners) who all work together to provide you with the care you need, when you need it.  We recommend signing up for the patient portal called "MyChart".  Sign up information is provided on this After Visit Summary.  MyChart is used to connect with patients for Virtual Visits (Telemedicine).  Patients are able to view lab/test results, encounter notes, upcoming appointments, etc.  Non-urgent messages can be sent to your provider as well.   To learn more about what you can do with MyChart, go to [email or web address].    Your next appointment:   12 month(s)  The format for your next appointment:   In Person  Provider:   You may see Lesleigh Noe, MD or one of the following Advanced Practice Providers on your designated Care Team:    Norma Fredrickson, NP  Nada Boozer, NP  Georgie Chard, NP    Other Instructions      Signed, Lesleigh Noe, MD  01/19/2020 4:09 PM    [facility]

## 2020-01-19 ENCOUNTER — Other Ambulatory Visit: Payer: Self-pay

## 2020-01-19 ENCOUNTER — Encounter: Payer: Self-pay | Admitting: Interventional Cardiology

## 2020-01-19 ENCOUNTER — Ambulatory Visit: Payer: Managed Care, Other (non HMO) | Admitting: Interventional Cardiology

## 2020-01-19 VITALS — BP 145/93 | HR 89 | Temp 97.3°F | Ht 62.0 in | Wt 135.2 lb

## 2020-01-19 DIAGNOSIS — E782 Mixed hyperlipidemia: Secondary | ICD-10-CM | POA: Diagnosis not present

## 2020-01-19 DIAGNOSIS — I10 Essential (primary) hypertension: Secondary | ICD-10-CM | POA: Diagnosis not present

## 2020-01-19 DIAGNOSIS — Z7189 Other specified counseling: Secondary | ICD-10-CM | POA: Diagnosis not present

## 2020-01-19 DIAGNOSIS — I491 Atrial premature depolarization: Secondary | ICD-10-CM | POA: Diagnosis not present

## 2020-01-19 NOTE — Patient Instructions (Signed)

## 2020-01-29 ENCOUNTER — Ambulatory Visit: Payer: Managed Care, Other (non HMO) | Attending: Internal Medicine

## 2020-01-29 DIAGNOSIS — Z23 Encounter for immunization: Secondary | ICD-10-CM

## 2020-01-29 NOTE — Progress Notes (Signed)
   Covid-19 Vaccination Clinic  Name:  Felicia Obrien    MRN: [id number] DOB: June 24, 1955  01/29/2020  [name] was observed post Covid-19 immunization for 15 minutes without incident. She was provided with Vaccine Information Sheet and instruction to access the V-Safe system.   [name] was instructed to call 911 with any severe reactions post vaccine: Marland Kitchen Difficulty breathing  . Swelling of face and throat  . A fast heartbeat  . A bad rash all over body  . Dizziness and weakness   Immunizations Administered    Name Date Dose VIS Date Route   Pfizer COVID-19 Vaccine 01/29/2020 12:19 PM 0.3 mL 12/15/2019 Intramuscular   Manufacturer: [employer], [employer]   Lot: [id number]   NDC: [address]-7

## 2020-02-11 ENCOUNTER — Other Ambulatory Visit: Payer: Self-pay | Admitting: Family Medicine

## 2020-02-11 DIAGNOSIS — Z1231 Encounter for screening mammogram for malignant neoplasm of breast: Secondary | ICD-10-CM

## 2020-02-11 DIAGNOSIS — E2839 Other primary ovarian failure: Secondary | ICD-10-CM

## 2020-02-14 ENCOUNTER — Other Ambulatory Visit: Payer: Self-pay

## 2020-02-14 ENCOUNTER — Ambulatory Visit
Admission: RE | Admit: 2020-02-14 | Discharge: 2020-02-14 | Disposition: A | Discharge status: Home / Self Care | Payer: Managed Care, Other (non HMO) | Source: Ambulatory Visit | Attending: Family Medicine | Admitting: Family Medicine

## 2020-02-14 DIAGNOSIS — E2839 Other primary ovarian failure: Secondary | ICD-10-CM

## 2020-02-23 ENCOUNTER — Other Ambulatory Visit (HOSPITAL_COMMUNITY): Payer: Self-pay

## 2020-06-07 ENCOUNTER — Other Ambulatory Visit: Payer: Managed Care, Other (non HMO)

## 2020-06-07 ENCOUNTER — Ambulatory Visit
Admission: RE | Admit: 2020-06-07 | Discharge: 2020-06-07 | Disposition: A | Discharge status: Home / Self Care | Payer: Managed Care, Other (non HMO) | Source: Ambulatory Visit | Attending: Family Medicine | Admitting: Family Medicine

## 2020-06-07 ENCOUNTER — Other Ambulatory Visit: Payer: Self-pay

## 2020-06-07 DIAGNOSIS — Z1231 Encounter for screening mammogram for malignant neoplasm of breast: Secondary | ICD-10-CM

## 2020-08-24 IMAGING — CT CT ABD-PELV W/ CM
2 of 5 series · 16 of 46 positions shown, 18 images · IV contrast (APPLIED)
Comparison: None.

CLINICAL DATA: Abdominal pain, central, prior pancreatic head mass
status post Whipple

EXAM:
CT ABDOMEN AND PELVIS WITH CONTRAST
TECHNIQUE: Multidetector CT imaging of the abdomen and pelvis was performed
using the standard protocol following bolus administration of
intravenous contrast.
CONTRAST:  100mL OMNIPAQUE IOHEXOL 300 MG/ML  SOLN

[Series 3: abdomen 5.0 · axial · 0.74mm/px · z∈[+898,+1268]mm · 13 of 88 slices shown, 15 images]
[im 7/88  soft-tissue]
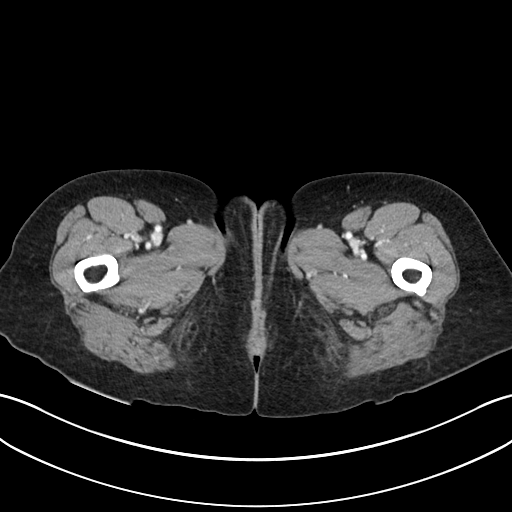
[im 7/88  bone]
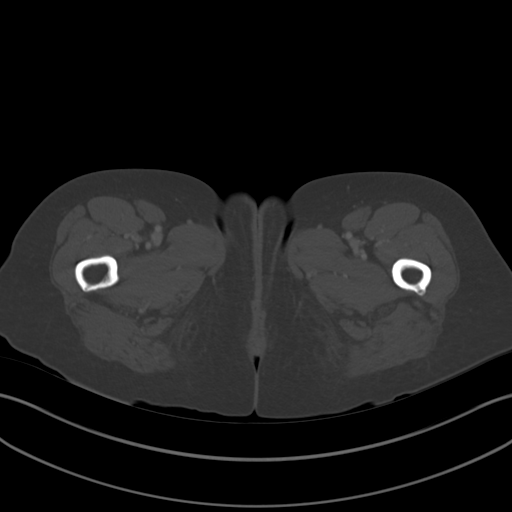
[im 13/88  soft-tissue]
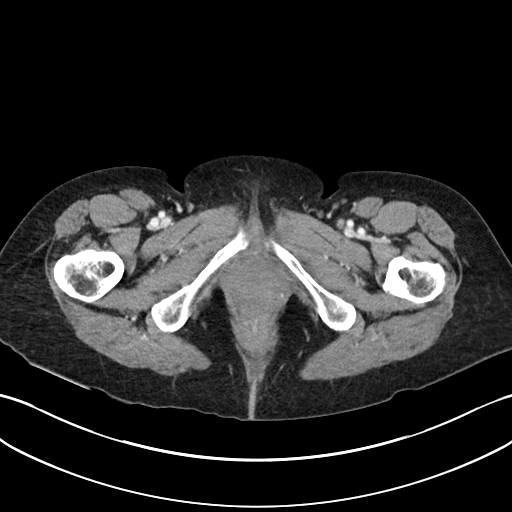
[im 19/88  soft-tissue]
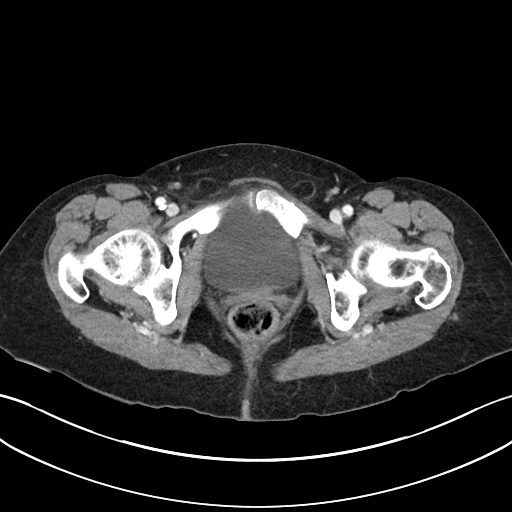
[im 25/88  soft-tissue]
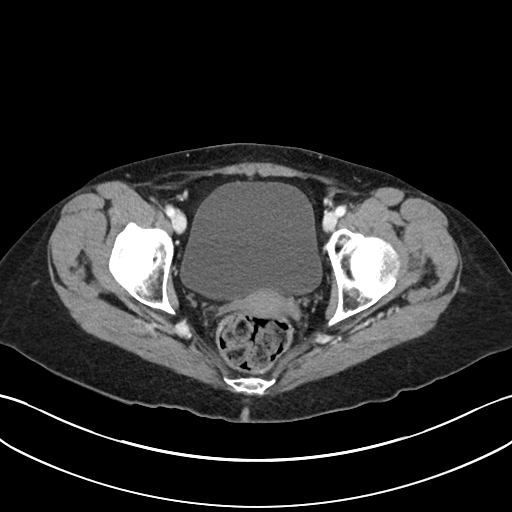
[im 32/88  soft-tissue]
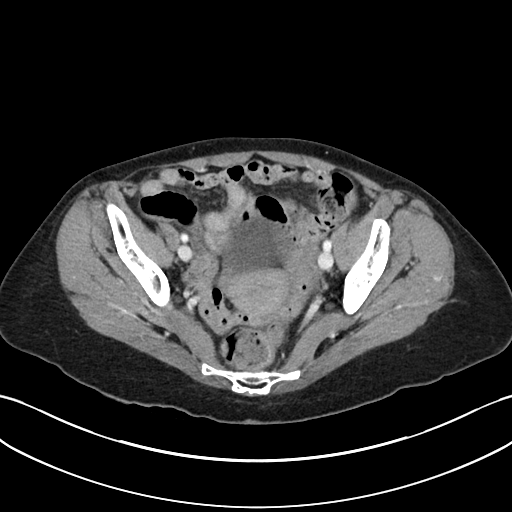
[im 38/88  soft-tissue]
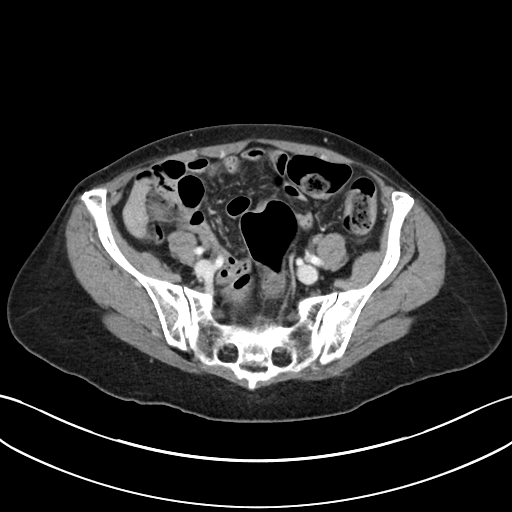
[im 44/88  soft-tissue]
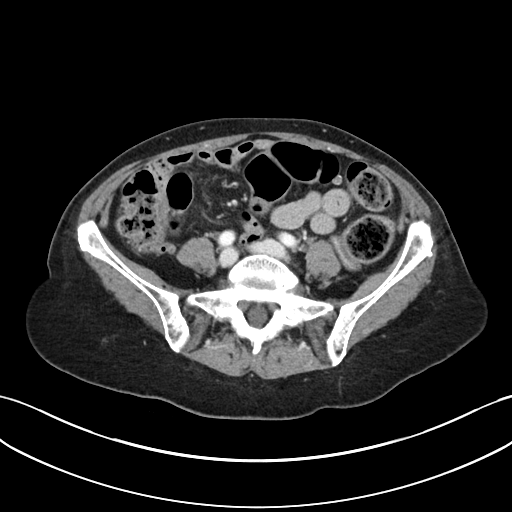
[im 50/88  soft-tissue]
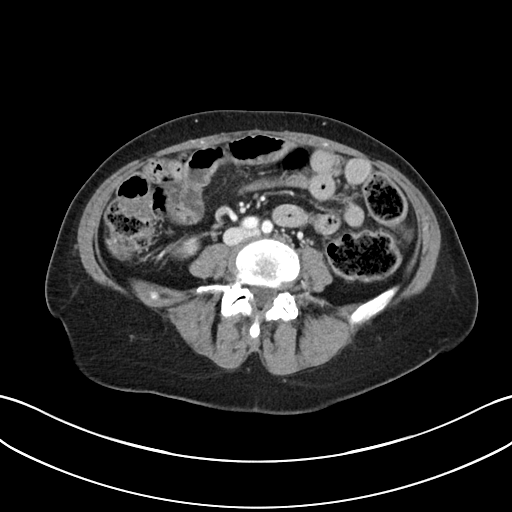
[im 56/88  soft-tissue]
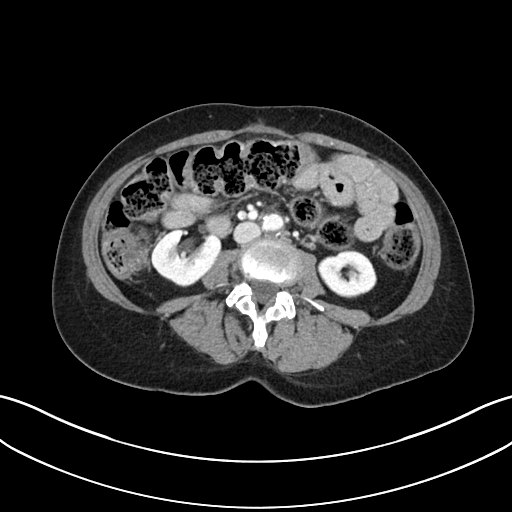
[im 56/88  bone]
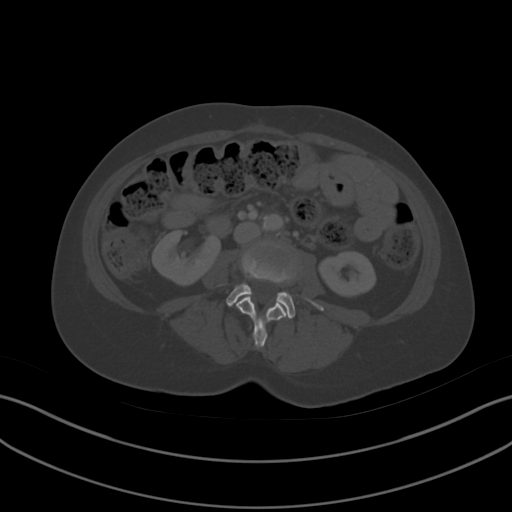
[im 63/88  soft-tissue]
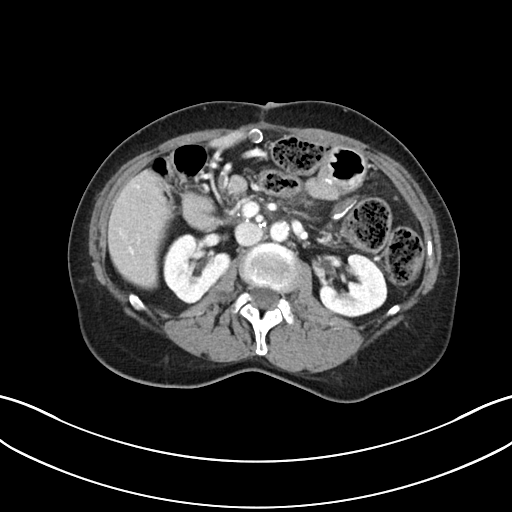
[im 69/88  soft-tissue]
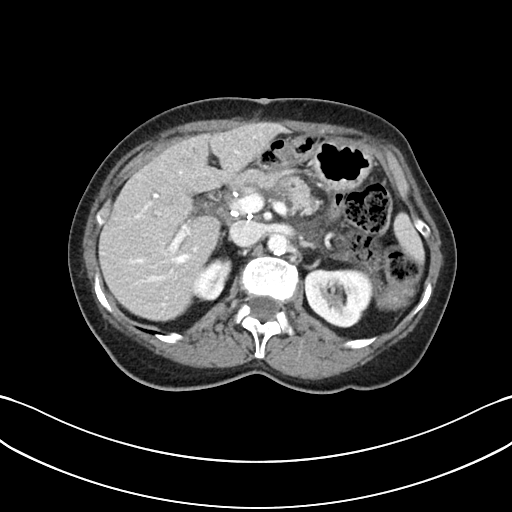
[im 75/88  soft-tissue]
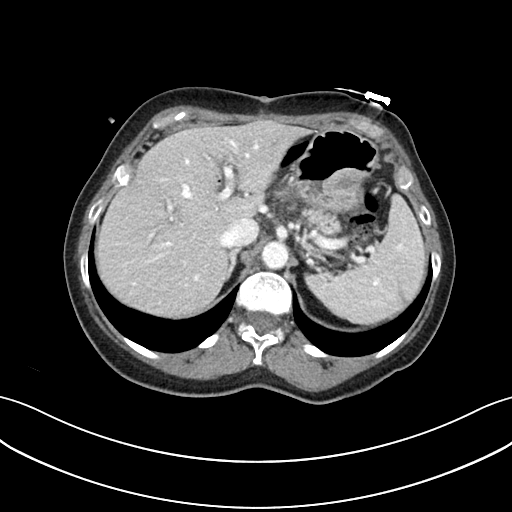
[im 81/88  soft-tissue]
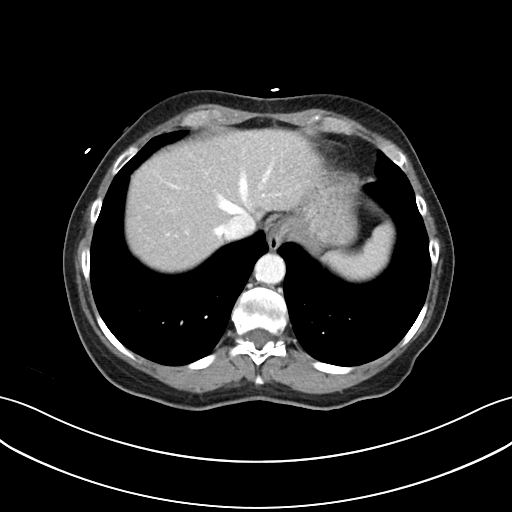

[Series 6: abdomen 3.0 mpr cor · coronal · 0.69mm/px · 3 of 88 slices shown]
[im 30/88  soft-tissue]
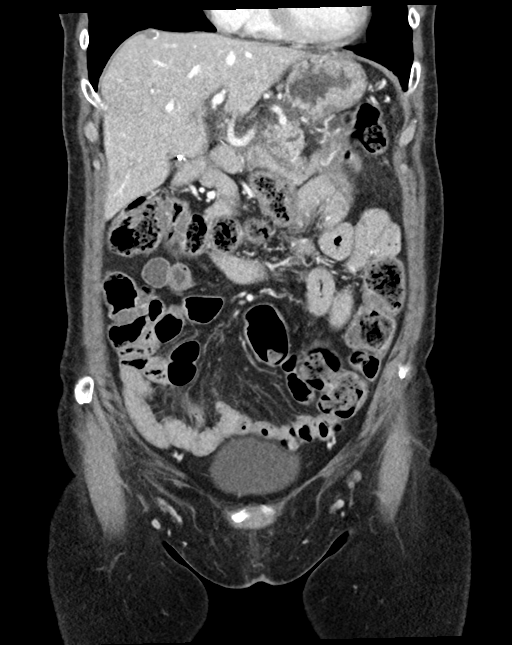
[im 39/88  soft-tissue]
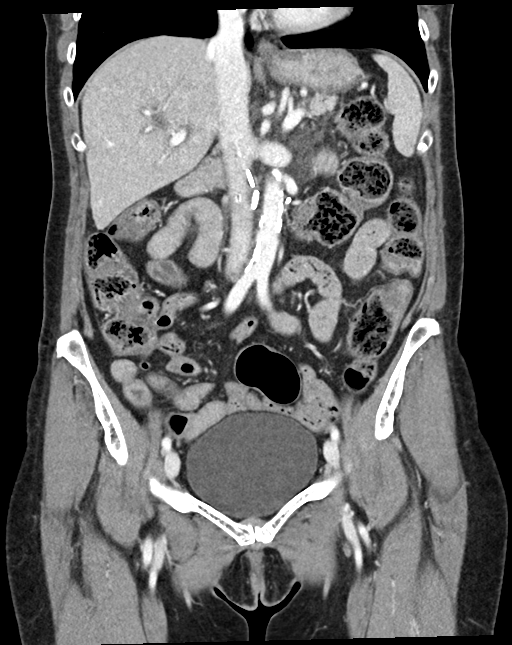
[im 49/88  soft-tissue]
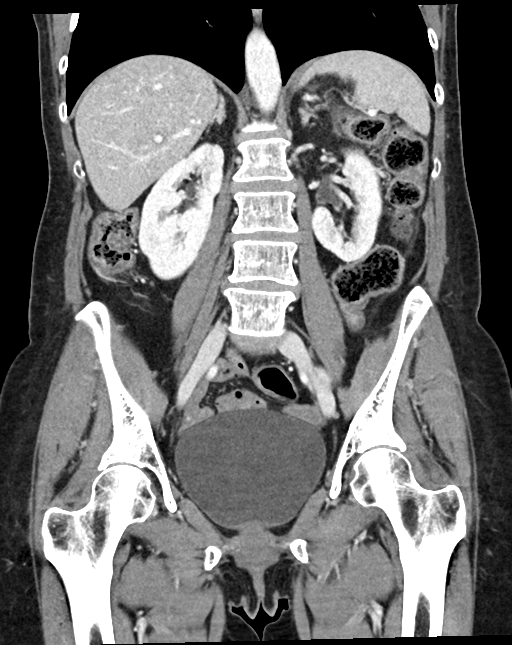

[16 of 46 positions shown; findings below may reference images not displayed]

FINDINGS: Lower chest: The visualized heart size within normal limits. No
pericardial fluid/thickening.

No hiatal hernia.

The visualized portions of the lungs are clear.

Hepatobiliary: There is diffuse low density seen throughout the
liver parenchyma. Main portal vein is patent. The patient is status
post cholecystectomy. No biliary ductal dilation.

Pancreas: The patient is status post Whipple with partial
pancreatectomy. There appears to be fat stranding changes seen
around the distal pancreatic body and tail. The inflammatory changes
extend into the retroperitoneum. No loculated fluid collections
however are noted.

Spleen: Normal in size without focal abnormality.

Adrenals/Urinary Tract: Both adrenal glands appear normal. The
kidneys and collecting system appear normal without evidence of
urinary tract calculus or hydronephrosis. Bladder is unremarkable.

Stomach/Bowel: The stomach, small bowel, and colon are normal in
appearance. No inflammatory changes, wall thickening, or obstructive
findings. Diverticulosis without diverticulitis is noted. The
appendix is normal.

Vascular/Lymphatic: There are no enlarged mesenteric,
retroperitoneal, or pelvic lymph nodes. Scattered aortic
atherosclerotic calcifications are seen without aneurysmal
dilatation.

Reproductive: The uterus and adnexa are unremarkable.

Other: No evidence of abdominal wall mass or hernia. Surgical clips
seen within the mid abdomen.

Musculoskeletal: No acute or significant osseous findings.
IMPRESSION: Status post Whipple with inflammatory changes seen surrounding the
distal pancreatic tail, consistent with pancreatitis. No loculated
fluid collections or evidence of pancreatic necrosis.

## 2020-09-20 ENCOUNTER — Telehealth: Payer: Self-pay | Admitting: Interventional Cardiology

## 2020-09-20 DIAGNOSIS — I10 Essential (primary) hypertension: Secondary | ICD-10-CM

## 2020-09-20 NOTE — Telephone Encounter (Signed)
Pt went to dentist this morning and BP was 160/102 and 194/102.  Dentist appt cancelled.  Pt states her BP gets this high when she is anxious.  BP at home normally 135-155/80-90.  HR always in the 50s.  Currently only taking Metoprolol Succinate 50mg  QD.  Has a headache with HTN that improves when BP is lower.  Also has noticed over the last year and a half, when she lays down her heart feels like it races for a couple of seconds.  Described it as a "pounding" feeling.  Pt was previously prescribed Amlodipine 2.5mg  in October 2020 but never took it.  Denies swelling or increased salt intake.  Occasionally takes Ibuprofen (1 tablet every 6 weeks).  Advised I will route to Dr. November 2020 for review and advisement.

## 2020-09-20 NOTE — Telephone Encounter (Signed)
Pt c/o BP issue: STAT if pt c/o blurred vision, one-sided weakness or slurred speech  1. What are your last 5 BP readings? 160/102; 194/102; 135/81  2. Are you having any other symptoms (ex. Dizziness, headache, blurred vision, passed out)? Irregular heartbeat, headache  3. What is your BP issue? High    STAT if HR is under 50 or over 120 (normal HR is 60-100 beats per minute)  What is your heart rate? 50s  Do you have a log of your heart rate readings (document readings)? 50-57  Do you have any other symptoms? High bp

## 2020-09-22 MED ORDER — AMLODIPINE BESYLATE 5 MG PO TABS: 5.0000 mg | ORAL_TABLET | Freq: Every day | ORAL | 3 refills | Status: DC

## 2020-09-22 NOTE — Telephone Encounter (Signed)
Spoke with the patient who is calling back for Dr. Michaelle Copas recommendations. Advised patient that he has not given any recommendations at this time but we will let her know as soon as he does.  She states that blood pressure at home has been 150s/90s. She is not having any symptoms.

## 2020-09-22 NOTE — Telephone Encounter (Signed)
    Pt is calling back to f/u, she is hoping to get Dr. Michaelle Copas recommendations today since her BP been running high

## 2020-09-22 NOTE — Telephone Encounter (Signed)
Spoke with the patient and advised her that Dr. Katrinka Blazing wants her to start on amlodipine 5 mg daily and see the pharmD in hypertension clinic. Patient verbalized understanding.

## 2020-10-12 ENCOUNTER — Other Ambulatory Visit: Payer: Self-pay

## 2020-10-12 ENCOUNTER — Ambulatory Visit (INDEPENDENT_AMBULATORY_CARE_PROVIDER_SITE_OTHER): Payer: Managed Care, Other (non HMO) | Admitting: Pharmacist

## 2020-10-12 VITALS — BP 164/92 | HR 60

## 2020-10-12 DIAGNOSIS — I10 Essential (primary) hypertension: Secondary | ICD-10-CM

## 2020-10-12 MED ORDER — VALSARTAN 80 MG PO TABS: 80.0000 mg | ORAL_TABLET | Freq: Every day | ORAL | 5 refills | Status: DC

## 2020-10-12 NOTE — Patient Instructions (Addendum)
It was nice to meet you today  Your blood pressure goal is < 130/24mmgHg  Start taking valsartan. You can cut the tablets in half and take 40mg  daily for the first week, then increase to 1 tablet of 80mg  once daily. Take this in the morning  Move your metoprolol to the evening and try taking the full tablet at once  Continue to monitor your blood pressure at home  Follow up in clinic in 3 weeks for blood pressure check and lab work

## 2020-10-12 NOTE — Progress Notes (Signed)
Patient ID: Felicia Obrien                 DOB: 1956/01/28                      MRN: [id number]     HPI: Kelina Beauchamp is a 65 y.o. female referred by Dr. Katrinka Blazing to HTN clinic. PMH is significant for palpitations/PACs, HLD, HTN, and neuroendocrine cancer resection via Whipple procedure at [facility] 04/2018. At last visit with Dr Katrinka Blazing in Nov 2021, BP was elevated at 145/93 and pt was encouraged to limit sodium intake, avoid NSAIDs, and stay active. Pt called clinic 09/20/20 with reports of elevated BP readings at the dentist of 160/102 and 194/102 so her appt was canceled. Reported BP increases when she is anxious and home BP ranges 135-155/80-90, HR 50s. She was advised to start amlodipine 5mg  daily and presents today for follow up.  Pt presents today in good spirits. She started noticing swelling in her feet, cramping in her legs, and lethargy after taking amlodipine for a few days. She started cutting her tablet in half on 7/31 which helped to improve her symptoms, still has some fatigue though. Has been cutting Toprol and taking 3/4 tab in the morning and 1/4 tab in the evening. Home BP readings have ranged 130/84 - 159/88, HR 54-65. AM readings typically 130s, PM readings typically 150. Has been taking amlodipine in the morning.  Reports white coat HTN at MD office and dentist. 8/31 1/2 a Xanax before her dentist appt, readings still elevated 160-190s systolic. Did not take any before today's appt but thinks she did before last appt with Dr Rochele Pages when BP was 145/93. She brings her home BP cuff today which is a bicep cuff she's had for 7-8 years. BP initially 190/102, improved to 164/92 by end of visit. Home cuff measured 176/104 which improved to 160/89. Does feel some pressure in her head which happens when BP is elevated.  Also has hx of pancreatitis, sometimes brought on by dehydration. Last episode about 1 year ago. Has half of her pancrease after Whipple procedure.  Current HTN meds:  amlodipine 5mg  daily (AM), Toprol 50mg  daily Previously tried: atenolol  BP goal: <130/25mmHg  Family History: The patient's family history includes Breast cancer (age of onset: 6) in her mother; Cancer in her mother; Heart disease in her father; Hyperlipidemia in her brother, brother, father, and sister; Hypertension in her father.  Social History: Denies tobacco, alcohol and drug use.  Diet: Minimal caffeine intake, watches salt intake. Avoids fried food.  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  Past Medical History:  Diagnosis Date   Abnormal Pap smear of cervix    Chronic pancreatitis (HCC)    GERD (gastroesophageal reflux disease)    Hyperlipidemia    Hypertension    PAC (premature atrial contraction)     Current  Outpatient Medications on File Prior to Visit  Medication Sig Dispense Refill   ALPRAZolam (XANAX) 0.25 MG tablet Take 0.25 mg by mouth at bedtime as needed for anxiety.     amLODipine (NORVASC) 5 MG tablet Take 1 tablet (5 mg total) by mouth daily. 90 tablet 3   Ascorbic Acid (VITA-C PO) Take 1 tablet by mouth daily.     aspirin 81 MG tablet Take 81 mg by mouth daily.     atorvastatin (LIPITOR) 10 MG tablet Take 10 mg by mouth daily.     B Complex Vitamins (VITAMIN B COMPLEX PO) Take 1 tablet by mouth daily.     Biotin 2500 MCG CAPS Take 2 chews by mouth daily.     Cholecalciferol (VITAMIN D) 2000 UNITS CAPS Take 2,000 Units by mouth daily.       levothyroxine (SYNTHROID) 25 MCG tablet Take 25 mcg by mouth daily before breakfast.     MAGNESIUM PO Take 166 mg by mouth daily. Gummies     metoprolol succinate (TOPROL-XL) 50 MG 24 hr tablet Take 1 tablet (50 mg total) by mouth daily. Take with or immediately following a meal. 90 tablet 3   Multiple Vitamin (MULTI-VITAMINS) TABS Take 1 tablet by mouth daily.     omeprazole (PRILOSEC OTC) 20 MG tablet Take 20 mg by mouth daily.     Probiotic Product (PROBIOTIC DAILY) CAPS Take 1 capsule by mouth daily.     RA KRILL OIL CAPS Take 1 capsule by mouth daily.      No current facility-administered medications on file prior to visit.    Allergies  Allergen Reactions   Other Other (See Comments)    Dairy products cause painful red skin welts,  abdominal bloating, aching, constipation   Atorvastatin Other (See Comments)    Muscle cramps with dose over 10 mg      Assessment/Plan:  1. Hypertension - BP remains elevated above goal <130/51mmHg with higher reading noted in office today secondary to white coat HTN. Home cuff measures accurately, will use home cuff readings when adjusting BP medications. Since home BP readings remain elevated 130-150 systolic, pt warrants additional HTN med management. She did not tolerate amlodipine 5mg  daily secondary to LE edema, cramping, and fatigue. Tolerating lower dose better but BP still uncontrolled. Will stop amlodipine to minimize med burden and will start valsartan 40mg  x1 week, then increase to 80mg  daily. She'll take this in the AM and move Toprol 50mg  to the PM. She'll continue monitoring BP at home and bring home readings to next appt in 3 weeks for BP check and BMET.   Madylyn Insco E. Viral Schramm, PharmD, BCACP, CPP [facility] [address] [place],  Phone: [phone]; Fax: [phone] 10/12/2020 12:04 PM

## 2020-11-06 ENCOUNTER — Ambulatory Visit: Payer: Managed Care, Other (non HMO) | Admitting: Pharmacist

## 2020-11-06 NOTE — Progress Notes (Deleted)
Patient ID: Felicia Obrien                 DOB: 01-30-1956                      MRN: [id number]     HPI: Felicia Obrien is a 65 y.o. female referred by Dr. Katrinka Blazing to HTN clinic. PMH is significant for palpitations/PACs, HLD, HTN, and neuroendocrine cancer resection via Whipple procedure at [facility] 04/2018. At last visit with Dr Katrinka Blazing in Nov 2021, BP was elevated at 145/93 and pt was encouraged to limit sodium intake, avoid NSAIDs, and stay active. Pt called clinic 09/20/20 with reports of elevated BP readings at the dentist of 160/102 and 194/102 so her appt was canceled. Reported BP increases when she is anxious and home BP ranges 135-155/80-90, HR 50s. She was advised to start amlodipine 5mg  daily. At last visit with me, pt reported LE edema, cramping, and fatigue on amlodipine and was changed to valsartan instead. She sent in a MyChart message on 8/22 reporting improved BP readings on amlodipine (107/73 - 125/80, HR 60-70s) and that her swelling had resolved. She was advised to continue on amlodipine since her BP were better controlled and she was tolerating the med better.   Reports white coat HTN at MD office and dentist. 9/22 1/2 a Xanax before her dentist appt, readings still elevated 160-190s systolic. Did not take any before today's appt but thinks she did before last appt with Dr Rochele Pages when BP was 145/93. She brings her home BP cuff today which is a bicep cuff she's had for 7-8 years. BP initially 190/102, improved to 164/92 by end of visit. Home cuff measured 176/104 which improved to 160/89. Does feel some pressure in her head which happens when BP is elevated.  Also has hx of pancreatitis, sometimes brought on by dehydration - will avoid diuretics if possible because of this. Last episode about 1 year ago. Has half of her pancrease after Whipple procedure.   Use home cuff for med adjustment - has white coat htn Any xanax before visit today? Feeling ok on amlodipine again? Did pt  move toprol to evening and stop cutting into bits? Can go back to valsartan 80 if needed, pt had already picked up   Current HTN meds: amlodipine 5mg  daily (AM), Toprol 50mg  daily (PM) Previously tried: atenolol, amlodipine - LE edema, cramping, and fatigue BP goal: <130/28mmHg  Family History: The patient's family history includes Breast cancer (age of onset: 40) in her mother; Cancer in her mother; Heart disease in her father; Hyperlipidemia in her brother, brother, father, and sister; Hypertension in her father.  Social History: Denies tobacco, alcohol and drug use.  Diet: Minimal caffeine intake, watches salt intake. Avoids fried food.  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  Past Medical History:  Diagnosis Date   Abnormal Pap smear  of cervix    Chronic pancreatitis (HCC)    GERD (gastroesophageal reflux disease)    Hyperlipidemia    Hypertension    PAC (premature atrial contraction)     Current Outpatient Medications on File Prior to Visit  Medication Sig Dispense Refill   ALPRAZolam (XANAX) 0.25 MG tablet Take 0.25 mg by mouth at bedtime as needed for anxiety.     amLODipine (NORVASC) 5 MG tablet Take 1 tablet (5 mg total) by mouth daily. 90 tablet 3   Ascorbic Acid (VITA-C PO) Take 1 tablet by mouth daily.     aspirin 81 MG tablet Take 81 mg by mouth daily.     atorvastatin (LIPITOR) 10 MG tablet Take 10 mg by mouth daily.     B Complex Vitamins (VITAMIN  B COMPLEX PO) Take 1 tablet by mouth daily.     Biotin 2500 MCG CAPS Take 2 chews by mouth daily.     Cholecalciferol (VITAMIN D) 2000 UNITS CAPS Take 2,000 Units by mouth daily.      levothyroxine (SYNTHROID) 25 MCG tablet Take 25 mcg by mouth daily before breakfast.     MAGNESIUM PO Take 166 mg by mouth daily. Gummies     metoprolol succinate (TOPROL-XL) 50 MG 24 hr tablet Take 1 tablet (50 mg total) by mouth daily. Take with or immediately following a meal. 90 tablet 3   Multiple Vitamin (MULTI-VITAMINS) TABS Take 1 tablet by mouth daily.     omeprazole (PRILOSEC OTC) 20 MG tablet Take 20 mg by mouth daily.     Probiotic Product (PROBIOTIC DAILY) CAPS Take 1 capsule by mouth daily.     RA KRILL OIL CAPS Take 1 capsule by mouth daily.      No current facility-administered medications on file prior to visit.    Allergies  Allergen Reactions   Other Other (See Comments)    Dairy products cause painful red skin welts,  abdominal bloating, aching, constipation   Atorvastatin Other (See Comments)    Muscle cramps with dose over 10 mg      Assessment/Plan:  1. Hypertension - BP remains elevated above goal <130/88mmHg with higher reading noted in office today secondary to white coat HTN. Home cuff measures accurately, will use home cuff readings when adjusting BP medications. Since home BP readings remain elevated 130-150 systolic, pt warrants additional HTN med management. She did not tolerate amlodipine 5mg  daily secondary to LE edema, cramping, and fatigue. Tolerating lower dose better but BP still uncontrolled. Will stop amlodipine to minimize med burden and will start valsartan 40mg  x1 week, then increase to 80mg  daily. She'll take this in the AM and move Toprol 50mg  to the PM. She'll continue monitoring BP at home and bring home readings to next appt in 3 weeks for BP check and BMET.   Adiel Mcnamara E. Akaiya Touchette, PharmD, BCACP, CPP [facility] [address],  [place],  Phone: [phone]; Fax: [phone] 11/06/2020 8:45 AM

## 2020-11-19 ENCOUNTER — Other Ambulatory Visit: Payer: Self-pay | Admitting: Interventional Cardiology

## 2021-03-01 NOTE — Progress Notes (Deleted)
Cardiology Office Note:    Date:  03/01/2021   ID:  Felicia Obrien, Felicia Obrien 12-Feb-1956, MRN [id number]  PCP:  Hulan Fess, MD  Cardiologist:  Sinclair Grooms, MD   Referring MD: Hulan Fess, MD   No chief complaint on file.   History of Present Illness:    Felicia Obrien is a 66 y.o. female with a hx of palpitations, hyperlipidemia, and hypertension.    ***  Past Medical History:  Diagnosis Date   Abnormal Pap smear of cervix    Chronic pancreatitis (HCC)    GERD (gastroesophageal reflux disease)    Hyperlipidemia    Hypertension    PAC (premature atrial contraction)     Past Surgical History:  Procedure Laterality Date   CERVICAL CONE BIOPSY     WHIPPLE PROCEDURE  04/2018    Current Medications: No outpatient medications have been marked as taking for the 03/02/21 encounter (Appointment) with Belva Crome, MD.     Allergies:   Other and Atorvastatin   Social History   Socioeconomic History   Marital status: Married    Spouse name: Not on file   Number of children: Not on file   Years of education: Not on file   Highest education level: Not on file  Occupational History   Not on file  Tobacco Use   Smoking status: Never   Smokeless tobacco: Never  Vaping Use   Vaping Use: Never used  Substance and Sexual Activity   Alcohol use: No   Drug use: No   Sexual activity: Not on file  Other Topics Concern   Not on file  Social History Narrative   Not on file   Social Determinants of Health   Financial Resource Strain: Not on file  Food Insecurity: Not on file  Transportation Needs: Not on file  Physical Activity: Not on file  Stress: Not on file  Social Connections: Not on file     Family History: The patient's family history includes Breast cancer (age of onset: 41) in her mother; Cancer in her mother; Heart disease in her father; Hyperlipidemia in her brother, brother, father, and sister; Hypertension in her father.  ROS:    Please see the history of present illness.    *** All other systems reviewed and are negative.  EKGs/Labs/Other Studies Reviewed:    The following studies were reviewed today: ***  EKG:  EKG ***  Recent Labs: No results found for requested labs within last 8760 hours.  Recent Lipid Panel    Component Value Date/Time   CHOL 133 01/08/2016 0730   TRIG 69 01/08/2016 0730   HDL 33 (L) 01/08/2016 0730   CHOLHDL 4.0 01/08/2016 0730   VLDL 14 01/08/2016 0730   LDLCALC 86 01/08/2016 0730    Physical Exam:    VS:  There were no vitals taken for this visit.    Wt Readings from Last 3 Encounters:  01/19/20 135 lb 3.2 oz (61.3 kg)  07/27/19 134 lb (60.8 kg)  07/06/19 136 lb 6.4 oz (61.9 kg)     GEN: ***. No acute distress HEENT: Normal NECK: No JVD. LYMPHATICS: No lymphadenopathy CARDIAC: *** murmur. RRR *** gallop, or edema. VASCULAR: *** Normal Pulses. No bruits. RESPIRATORY:  Clear to auscultation without rales, wheezing or rhonchi  ABDOMEN: Soft, non-tender, non-distended, No pulsatile mass, MUSCULOSKELETAL: No deformity  SKIN: Warm and dry NEUROLOGIC:  Alert and oriented x 3 PSYCHIATRIC:  Normal affect   ASSESSMENT:  1. PAC (premature atrial contraction)   2. Primary hypertension   3. Mixed hyperlipidemia    PLAN:    In order of problems listed above:  ***   Medication Adjustments/Labs and Tests Ordered: Current medicines are reviewed at length with the patient today.  Concerns regarding medicines are outlined above.  No orders of the defined types were placed in this encounter.  No orders of the defined types were placed in this encounter.   There are no Patient Instructions on file for this visit.   Signed, Sinclair Grooms, MD  03/01/2021 9:12 PM    [facility]

## 2021-03-02 ENCOUNTER — Other Ambulatory Visit: Payer: Self-pay

## 2021-03-02 ENCOUNTER — Ambulatory Visit (INDEPENDENT_AMBULATORY_CARE_PROVIDER_SITE_OTHER): Payer: Medicare Other | Admitting: Interventional Cardiology

## 2021-03-02 ENCOUNTER — Encounter: Payer: Self-pay | Admitting: Interventional Cardiology

## 2021-03-02 VITALS — BP 138/82 | HR 62 | Ht 62.0 in | Wt 140.8 lb

## 2021-03-02 DIAGNOSIS — I491 Atrial premature depolarization: Secondary | ICD-10-CM

## 2021-03-02 DIAGNOSIS — I10 Essential (primary) hypertension: Secondary | ICD-10-CM

## 2021-03-02 DIAGNOSIS — Z9189 Other specified personal risk factors, not elsewhere classified: Secondary | ICD-10-CM | POA: Diagnosis not present

## 2021-03-02 DIAGNOSIS — E782 Mixed hyperlipidemia: Secondary | ICD-10-CM | POA: Diagnosis not present

## 2021-03-02 NOTE — Progress Notes (Signed)
Cardiology Office Note:    Date:  03/02/2021   ID:  Felicia, Obrien Jan 31, 1956, MRN [id number]  PCP:  Orvan July, NP  Cardiologist:  Sinclair Grooms, MD   Referring MD: Hulan Fess, MD   Chief Complaint  Patient presents with   Hypertension   Follow-up    CV risk     History of Present Illness:    Felicia Obrien is a 66 y.o. female with a hx of palpitations, hyperlipidemia, and hypertension.    She is doing well.  She has no cardiac complaints.  There is a family history of vascular disease.  She is concerned that on occasion, her blood pressure is elevated especially when she is in the dentist's office.  She was unable to tolerate 5 mg of amlodipine because of edema.  She is now on 2.5 mg.  She has some concerned about the level of BP control, occasionally seeing systolic blood pressures greater than 150 mmHg.  Past Medical History:  Diagnosis Date   Abnormal Pap smear of cervix    Chronic pancreatitis (HCC)    GERD (gastroesophageal reflux disease)    Hyperlipidemia    Hypertension    PAC (premature atrial contraction)     Past Surgical History:  Procedure Laterality Date   CERVICAL CONE BIOPSY     WHIPPLE PROCEDURE  04/2018    Current Medications: Current Meds  Medication Sig   ALPRAZolam (XANAX) 0.25 MG tablet Take 0.25 mg by mouth at bedtime as needed for anxiety.   amLODipine (NORVASC) 2.5 MG tablet Take 2.52 mg by mouth daily.   Ascorbic Acid (VITA-C PO) Take 1 tablet by mouth daily.   Ascorbic Acid (VITAMIN C) 500 MG CHEW Chew 1 tablet by mouth daily.   aspirin 81 MG tablet Take 81 mg by mouth daily.   atorvastatin (LIPITOR) 10 MG tablet Take 10 mg by mouth daily.   B Complex Vitamins (VITAMIN B COMPLEX PO) Take 1 tablet by mouth daily.   Biotin 2500 MCG CAPS Take 2 chews by mouth daily.   Cholecalciferol (VITAMIN D) 2000 UNITS CAPS Take 2,000 Units by mouth daily.    levothyroxine (SYNTHROID) 25 MCG tablet Take 25 mcg by mouth  daily before breakfast.   MAGNESIUM PO Take 166 mg by mouth daily. Gummies   metoprolol succinate (TOPROL-XL) 50 MG 24 hr tablet Take 1 tablet (50 mg total) by mouth daily. Pt needs to keep upcoming appt in Jan, 2023 for further refills   Multiple Vitamin (MULTI-VITAMINS) TABS Take 1 tablet by mouth daily.   Multiple Vitamins-Minerals (HAIR SKIN NAILS) CAPS Take 1 capsule by mouth daily.   omeprazole (PRILOSEC OTC) 20 MG tablet Take 20 mg by mouth every other day. Pt take 20mg  1 tab every other day.   Probiotic Product (PROBIOTIC DAILY) CAPS Take 1 capsule by mouth daily.   RA KRILL OIL CAPS Take 1 capsule by mouth daily.      Allergies:   Other and Atorvastatin   Social History   Socioeconomic History   Marital status: Married    Spouse name: Not on file   Number of children: Not on file   Years of education: Not on file   Highest education level: Not on file  Occupational History   Not on file  Tobacco Use   Smoking status: Never   Smokeless tobacco: Never  Vaping Use   Vaping Use: Never used  Substance and Sexual Activity   Alcohol use: No  Drug use: No   Sexual activity: Not on file  Other Topics Concern   Not on file  Social History Narrative   Not on file   Social Determinants of Health   Financial Resource Strain: Not on file  Food Insecurity: Not on file  Transportation Needs: Not on file  Physical Activity: Not on file  Stress: Not on file  Social Connections: Not on file     Family History: The patient's family history includes Breast cancer (age of onset: 102) in her mother; Cancer in her mother; Heart disease in her father; Hyperlipidemia in her brother, brother, father, and sister; Hypertension in her father.  ROS:   Please see the history of present illness.    Family history of vascular disease.  Appetite is stable.  No medication side effects.  Higher intensity statin therapy causes intolerance due to musculoskeletal symptoms.  Still has some  difficulty with chronic pancreatitis symptoms.  No palpitations.  All other systems reviewed and are negative.  EKGs/Labs/Other Studies Reviewed:    The following studies were reviewed today: No cardiac imaging  EKG:  EKG tracing with normal sinus rhythm and prominent voltage.  Recent Labs: No results found for requested labs within last 8760 hours.  Recent Lipid Panel    Component Value Date/Time   CHOL 133 01/08/2016 0730   TRIG 69 01/08/2016 0730   HDL 33 (L) 01/08/2016 0730   CHOLHDL 4.0 01/08/2016 0730   VLDL 14 01/08/2016 0730   LDLCALC 86 01/08/2016 0730    Physical Exam:    VS:  BP 138/82    Pulse 62    Ht 5\' 2"  (1.575 m)    Wt 140 lb 12.8 oz (63.9 kg)    SpO2 98%    BMI 25.75 kg/m     Wt Readings from Last 3 Encounters:  03/02/21 140 lb 12.8 oz (63.9 kg)  01/19/20 135 lb 3.2 oz (61.3 kg)  07/27/19 134 lb (60.8 kg)     GEN: Healthy appearing. No acute distress HEENT: Normal NECK: No JVD. LYMPHATICS: No lymphadenopathy CARDIAC: No murmur. RRR no gallop, or edema. VASCULAR:  Normal Pulses. No bruits. RESPIRATORY:  Clear to auscultation without rales, wheezing or rhonchi  ABDOMEN: Soft, non-tender, non-distended, No pulsatile mass, MUSCULOSKELETAL: No deformity  SKIN: Warm and dry NEUROLOGIC:  Alert and oriented x 3 PSYCHIATRIC:  Normal affect   ASSESSMENT:    1. At risk for cardiovascular event   2. PAC (premature atrial contraction)   3. Primary hypertension   4. Mixed hyperlipidemia    PLAN:    In order of problems listed above:  Labile blood pressures/hypertension, hyperlipidemia, family history place her at increased risk.  Lipids are not well controlled with the most recent LDL of 112 being noted. Asymptomatic Blood pressure is somewhat labile.  Adequate control this morning.  Alternative options discussed including increasing the dose of beta-blocker but she recalls that a higher dose led to side effects. On 10 mg of atorvastatin.  LDL is  greater than 115.  Coronary calcium score will be done to clarify CV risk and whether we need to be more aggressive with lipid management.   Overall education and awareness concerning primary risk prevention was discussed in detail: LDL less than 70, hemoglobin A1c less than 7, blood pressure target less than 130/80 mmHg, >150 minutes of moderate aerobic activity per week, avoidance of smoking, weight control (via diet and exercise), and continued surveillance/management of/for obstructive sleep apnea.    Medication  Adjustments/Labs and Tests Ordered: Current medicines are reviewed at length with the patient today.  Concerns regarding medicines are outlined above.  Orders Placed This Encounter  Procedures   CT CARDIAC SCORING (SELF PAY ONLY)   EKG 12-Lead    No orders of the defined types were placed in this encounter.   Patient Instructions  Medication Instructions:  Your physician recommends that you continue on your current medications as directed. Please refer to the Current Medication list given to you today.  *If you need a refill on your cardiac medications before your next appointment, please call your pharmacy*   Lab Work: None If you have labs (blood work) drawn today and your tests are completely normal, you will receive your results only by: [place] (if you have MyChart) OR A paper copy in the mail If you have any lab test that is abnormal or we need to change your treatment, we will call you to review the results.   Testing/Procedures: Your physician recommends that you have a Calcium Score performed.   Follow-Up: At [facility], you and your health needs are our priority.  As part of our continuing mission to provide you with exceptional heart care, we have created designated Provider Care Teams.  These Care Teams include your primary Cardiologist (physician) and Advanced Practice Providers (APPs -  Physician Assistants and Nurse Practitioners) who all  work together to provide you with the care you need, when you need it.  We recommend signing up for the patient portal called "MyChart".  Sign up information is provided on this After Visit Summary.  MyChart is used to connect with patients for Virtual Visits (Telemedicine).  Patients are able to view lab/test results, encounter notes, upcoming appointments, etc.  Non-urgent messages can be sent to your provider as well.   To learn more about what you can do with MyChart, go to [email or web address].    Your next appointment:   1 year(s)  The format for your next appointment:   In Person  Provider:   Sinclair Grooms, MD     Other Instructions     Signed, Sinclair Grooms, MD  03/02/2021 9:49 AM    [place]

## 2021-03-02 NOTE — Patient Instructions (Signed)
Medication Instructions:  Your physician recommends that you continue on your current medications as directed. Please refer to the Current Medication list given to you today.  *If you need a refill on your cardiac medications before your next appointment, please call your pharmacy*   Lab Work: None If you have labs (blood work) drawn today and your tests are completely normal, you will receive your results only by: MyChart Message (if you have MyChart) OR A paper copy in the mail If you have any lab test that is abnormal or we need to change your treatment, we will call you to review the results.   Testing/Procedures: Your physician recommends that you have a Calcium Score performed.   Follow-Up: At [facility], you and your health needs are our priority.  As part of our continuing mission to provide you with exceptional heart care, we have created designated Provider Care Teams.  These Care Teams include your primary Cardiologist (physician) and Advanced Practice Providers (APPs -  Physician Assistants and Nurse Practitioners) who all work together to provide you with the care you need, when you need it.  We recommend signing up for the patient portal called "MyChart".  Sign up information is provided on this After Visit Summary.  MyChart is used to connect with patients for Virtual Visits (Telemedicine).  Patients are able to view lab/test results, encounter notes, upcoming appointments, etc.  Non-urgent messages can be sent to your provider as well.   To learn more about what you can do with MyChart, go to [email or web address].    Your next appointment:   1 year(s)  The format for your next appointment:   In Person  Provider:   Henry W Smith III, MD     Other Instructions   

## 2021-04-03 ENCOUNTER — Other Ambulatory Visit: Payer: PRIVATE HEALTH INSURANCE

## 2021-05-14 ENCOUNTER — Other Ambulatory Visit: Payer: Self-pay | Admitting: Family Medicine

## 2021-05-14 DIAGNOSIS — Z1231 Encounter for screening mammogram for malignant neoplasm of breast: Secondary | ICD-10-CM

## 2021-05-15 ENCOUNTER — Other Ambulatory Visit: Payer: Self-pay

## 2021-05-15 ENCOUNTER — Ambulatory Visit (INDEPENDENT_AMBULATORY_CARE_PROVIDER_SITE_OTHER)
Admission: RE | Admit: 2021-05-15 | Discharge: 2021-05-15 | Disposition: A | Discharge status: Home / Self Care | Payer: Self-pay | Source: Ambulatory Visit | Attending: Interventional Cardiology | Admitting: Interventional Cardiology

## 2021-05-15 DIAGNOSIS — E782 Mixed hyperlipidemia: Secondary | ICD-10-CM

## 2021-05-15 DIAGNOSIS — I10 Essential (primary) hypertension: Secondary | ICD-10-CM

## 2021-05-15 DIAGNOSIS — I491 Atrial premature depolarization: Secondary | ICD-10-CM

## 2021-06-11 ENCOUNTER — Other Ambulatory Visit: Payer: Self-pay | Admitting: Interventional Cardiology

## 2021-06-12 ENCOUNTER — Ambulatory Visit
Admission: RE | Admit: 2021-06-12 | Discharge: 2021-06-12 | Disposition: A | Discharge status: Home / Self Care | Payer: Medicare Other | Source: Ambulatory Visit | Attending: Family Medicine | Admitting: Family Medicine

## 2021-06-12 DIAGNOSIS — Z1231 Encounter for screening mammogram for malignant neoplasm of breast: Secondary | ICD-10-CM

## 2021-06-15 NOTE — Progress Notes (Signed)
Patient ID: Felicia Obrien                 DOB: [birth year]                    MRN: [id number] ? ? ? ? ?HPI: ?Felicia Obrien is a 66 y.o. female patient referred to lipid clinic by Dr. Katrinka Blazing. PMH is significant for HTN, HLD, palpitations, aortic atherosclerosis. Cardiac CT 05/15/21 showed calcium score of 7.84 which is the 58th percentile for age- and sex-matched controls. It was recommended to increase atorvastatin based on these results but the patient reported muscle pain when she had increased it in the past. She reported not trying other statins before.  ? ?Today, patient arrives in good spirits. Reports that when she increased atorvastatin above 10 mg in the past she experienced muscle pain that occurred within 1-2 weeks. Also reports that when she started atorvastatin 15 years ago she noticed her head would turn involuntarily to the left and when she focuses on keeping it straight her head shakes. She had this evaluated at the time and reports that it was thought to be related to atorvastatin but it did not improve with discontinuation so she has remained on it. Both of her parents had high cholesterol and now she and all of her siblings are on statins. Reports a few of her siblings are on rosuvastatin and tolerate it well. When she focused on eating a healthy diet and exercising regularly her LDL was 86 in 2020. She reports not eating as healthy lately.  ? ?Current Medications: atorvastatin 10 mg daily ?Intolerances: atorvastatin >10 mg (myalgias) ?Risk Factors: HLD, calcium score, aortic atherosclerosis, HTN, Fhx ASCVD ?LDL goal: <70 mg/dL ? 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 Eats a lot of vegetables and carbs. Grilled chicken, fish. Cannot have any dairy. Reports eating more fat than usual.  ? ?Exercise: Walks at  the [facility]. Has a small farm - walks average 7,000 steps per day ? ?Family History: The patient's family history includes Breast cancer (age of onset: 28) in her mother; Cancer in her mother; Heart disease in her father (CABG, age 18); Hyperlipidemia in her brother, brother, father, and sister; Hypertension in her father. ? ?Social History: Never smoker ? ?Labs: ?02/28/21: TC 198, TG 114, HDL 54, LDL 124 (atorvastatin 10 mg daily) ? ?Past Medical History:  ?Diagnosis Date  ? Abnormal Pap smear of cervix   ? Chronic pancreatitis (HCC)   ? GERD (gastroesophageal reflux disease)   ? Hyperlipidemia   ? Hypertension   ? PAC (premature atrial contraction)   ? ?Current Outpatient Medications on File Prior to Visit  ?Medication Sig Dispense Refill  ? ALPRAZolam (XANAX) 0.25 MG tablet Take 0.25 mg by mouth at bedtime as needed for anxiety.    ? amLODipine (NORVASC) 2.5 MG tablet Take 2.52 mg by mouth daily.    ? Ascorbic Acid (VITA-C PO) Take 1 tablet by mouth daily.    ? Ascorbic Acid (VITAMIN C) 500 MG CHEW Chew 1 tablet by mouth daily.    ? aspirin 81 MG tablet Take 81 mg by mouth daily.    ? atorvastatin (LIPITOR) 10 MG tablet Take 10 mg by mouth daily.    ? B Complex Vitamins (VITAMIN B COMPLEX PO) Take 1 tablet by  mouth daily.    ? Biotin 2500 MCG CAPS Take 2 chews by mouth daily.    ? Cholecalciferol (VITAMIN D) 2000 UNITS CAPS Take 2,000 Units by mouth daily.     ? levothyroxine (SYNTHROID) 25 MCG tablet Take 25 mcg by mouth daily before breakfast.    ? MAGNESIUM PO Take 166 mg by mouth daily. Gummies    ? metoprolol succinate (TOPROL-XL) 50 MG 24 hr tablet TAKE 1 TABLET BY MOUTH ONCE DAILY . APPOINTMENT REQUIRED FOR FUTURE REFILLS 90 tablet 3  ? Multiple Vitamin (MULTI-VITAMINS) TABS Take 1 tablet by mouth daily.    ? Multiple Vitamins-Minerals (HAIR SKIN NAILS) CAPS Take 1 capsule by mouth daily.    ? omeprazole (PRILOSEC OTC) 20 MG tablet Take 20 mg by mouth every other day. Pt take 20mg  1 tab every other day.    ?  Probiotic Product (PROBIOTIC DAILY) CAPS Take 1 capsule by mouth daily.    ? RA KRILL OIL CAPS Take 1 capsule by mouth daily.     ? ?No current facility-administered medications on file prior to visit.  ? ?Allergies  ?Allergen Reactions  ? Other Other (See Comments)  ?  Dairy products cause painful red skin welts,  abdominal bloating, aching, constipation  ? Atorvastatin Other (See Comments)  ?  Muscle cramps with dose over 10 mg ?  ? ? ?Assessment/Plan: ? ?1. Hyperlipidemia - Most recent LDL is above goal <70 mg/dL. Since patient is not able to tolerate atorvastatin above 10 mg daily and she has not tried other statins before, will switch atorvastatin to rosuvastatin 20 mg daily. Will call the patient in 2 weeks to see if she is tolerating this well. Will likely need to max out statin therapy in order to bring LDL to goal. May also require addition of ezetimibe depending on how much she is able to lower LDL with statin change in combination with lifestyle changes. Encouraged her to focus on eating a healthy diet and exercising regularly.  ? ?2. Hypertension - Patient has previously been seen by HTN clinic. At the end of today's visit, she requests to schedule follow up to be evaluated for her blood pressure. She would like to coordinate this with when she comes in for follow up fasting labs. Will plan this at follow up phone call in 2 weeks.  ? ? , PharmD ?PGY2 Ambulatory Care Pharmacy Resident ?06/19/2021 9:14 AM ? ?

## 2021-06-19 ENCOUNTER — Ambulatory Visit (INDEPENDENT_AMBULATORY_CARE_PROVIDER_SITE_OTHER): Payer: Medicare Other | Admitting: Student-PharmD

## 2021-06-19 DIAGNOSIS — E782 Mixed hyperlipidemia: Secondary | ICD-10-CM

## 2021-06-19 MED ORDER — ROSUVASTATIN CALCIUM 20 MG PO TABS: 20.0000 mg | ORAL_TABLET | Freq: Every day | ORAL | 3 refills | Status: DC

## 2021-06-19 NOTE — Patient Instructions (Signed)
Nice to see you today! ? ?Keep up the good work with diet and exercise. Aim for a diet full of vegetables, fruit and lean meats (chicken, [place], fish). Try to limit carbs (bread, pasta, sugar, rice) and red meat consumption. ? ?Your goal LDL is less than 70 mg/dL, you're currently at 124 mg/dL ? ?Medication Changes: ?Stop taking atorvastatin.  ? ?Start taking rosuvastatin 20 mg daily.  ? ?I'll give you a call in 2 weeks to see how you are tolerating rosuvastatin. We will plan your labs accordingly.  ? ?Please give [place] a call at [phone] with any questions or concerns. ?

## 2021-07-03 ENCOUNTER — Telehealth: Payer: Self-pay | Admitting: Student-PharmD

## 2021-07-03 NOTE — Telephone Encounter (Signed)
Called to see how patient is doing since starting rosuvastatin 20 mg. She was on atorvastatin 10 mg previously and tolerated this well but is unable to tolerate any higher doses. She reports she started rosuvastatin about a week ago and started experiencing her arms aching on Saturday then is having nausea today. She thinks these are related to the rosuvastatin and asks if she can try cutting them in half to see if she has symptoms on that dose. Counseled her to stop the statin for a few days to allow it to wash out then to restart at 1/2 tablet (10 mg). I will reach back out in 2 weeks to see how she is doing since her symptoms appeared after a few days this time. If she is not able to tolerate this either, can switch back to atorvastatin 10 mg which she can tolerate and add ezetimibe.  ?

## 2021-07-17 ENCOUNTER — Telehealth: Payer: Self-pay | Admitting: Student-PharmD

## 2021-07-17 DIAGNOSIS — E782 Mixed hyperlipidemia: Secondary | ICD-10-CM

## 2021-07-17 MED ORDER — ATORVASTATIN CALCIUM 10 MG PO TABS: 10.0000 mg | ORAL_TABLET | Freq: Every day | ORAL | 11 refills | Status: DC

## 2021-07-17 NOTE — Telephone Encounter (Signed)
Called patient to see how she is doing since decreasing rosuvastatin to 1/2 tablet (=10 mg) since she did not tolerate 20 mg due to muscle pain and nausea. She said she misunderstood and went back to taking atorvastatin 10 mg since that is what she tolerated in the past. She reports her symptoms resolved with this switch and she has had no issues on atorvastatin. She asks if rosuvastatin 10 mg is stronger than atorvastatin 10 mg. Explained that it is a higher dose but both doses are moderate intensity statins and lower LDL 30-50%. Discussed that whether she tried rosuvastatin 10 mg or stayed on atorvastatin 10 mg she will need an additional agent to lower LDL to goal. Discussed adding ezetimibe 10 mg daily. She said she is hesitant to add any other medications and would like a few days to think about this. Requests call back in 2 or 3 days. Will call her at that time to discuss further.

## 2021-07-20 NOTE — Telephone Encounter (Signed)
Called patient to see if she has made a decision to start ezetimibe. Unable to reach. LVM requesting call back.

## 2021-08-07 NOTE — Telephone Encounter (Signed)
Called patient to follow up but unable to reach. LVM requesting call back.

## 2021-08-14 MED ORDER — ATORVASTATIN CALCIUM 10 MG PO TABS: 10.0000 mg | ORAL_TABLET | Freq: Every day | ORAL | 3 refills | Status: DC

## 2021-08-14 MED ORDER — EZETIMIBE 10 MG PO TABS: 10.0000 mg | ORAL_TABLET | Freq: Every day | ORAL | 3 refills | Status: DC

## 2021-08-14 NOTE — Telephone Encounter (Signed)
Called patient again and was able to reach her. She does not want to add another medication but is agreeable to trying ezetimibe. Will start ezetimibe 10 mg daily and continue atorvastatin 10 mg daily. Lab appt scheduled for 7/25. Patient aware to come fasting.

## 2021-08-15 MED ORDER — EZETIMIBE 10 MG PO TABS: 10.0000 mg | ORAL_TABLET | Freq: Every day | ORAL | 3 refills | Status: DC

## 2021-08-15 NOTE — Addendum Note (Signed)
Addended by: Malena Peer D on: 08/15/2021 08:34 AM   Modules accepted: Orders

## 2021-09-09 ENCOUNTER — Other Ambulatory Visit: Payer: Self-pay | Admitting: Interventional Cardiology

## 2021-09-18 ENCOUNTER — Other Ambulatory Visit: Payer: PRIVATE HEALTH INSURANCE

## 2021-10-19 ENCOUNTER — Other Ambulatory Visit: Payer: PRIVATE HEALTH INSURANCE

## 2022-03-05 ENCOUNTER — Other Ambulatory Visit: Payer: Self-pay | Admitting: Interventional Cardiology

## 2022-03-07 ENCOUNTER — Other Ambulatory Visit: Payer: Self-pay | Admitting: Family Medicine

## 2022-03-07 DIAGNOSIS — M8580 Other specified disorders of bone density and structure, unspecified site: Secondary | ICD-10-CM

## 2022-03-14 ENCOUNTER — Ambulatory Visit: Payer: PRIVATE HEALTH INSURANCE | Admitting: Cardiology

## 2022-03-28 ENCOUNTER — Other Ambulatory Visit: Payer: Self-pay | Admitting: Family Medicine

## 2022-03-28 DIAGNOSIS — Z1231 Encounter for screening mammogram for malignant neoplasm of breast: Secondary | ICD-10-CM

## 2022-04-21 ENCOUNTER — Encounter: Payer: Self-pay | Admitting: Cardiovascular Disease

## 2022-04-21 NOTE — Progress Notes (Unsigned)
Cardiology Office Note:    Date:  04/22/2022   ID:  Zoa, Fondren December 09, 1955, MRN [id number]  PCP:  Orvan July, NP (Inactive)   [facility] Providers Cardiologist:  Sinclair Grooms, MD (Inactive)  New to [place]     Referring MD: No ref. provider found   Chief Complaint  Patient presents with   Hyperlipidemia     Feb. 26, 2024   Dutchess Ignacio is a 67 y.o. female with a hx of hyperlipidemia, HTN, palpitations, aortic atherosclerosis  . Has previously been seen by Dr. Tamala Julian .  Is friends with [name],  I am meeting today for the first time   Coronary calcium score of 7.84. This was 79 th percentile for age and sex matched control.  Lipids have been primarily managed by her primary medical doctor at [facility].  Has significant white coat HTN    January, 2024 labs: Total cholesterol is 192 HDL is 61 LDL is 117 Triglyceride level is 81  Still eats salt  Exercises regularly,  walks , light weight   Farms  Horses, chickens, dogs   BP at home has been better by still quite variable   + family hx of CAD ( father had CAD at 59s)   Has occasional Palpitations , clinically the sound like premature ventricular contractions.  I told her that I am not concerned about these potation's.  She did not think that they were severe enough to warrant wearing an event monitor.     Past Medical History:  Diagnosis Date   Abnormal Pap smear of cervix    Chronic pancreatitis (HCC)    GERD (gastroesophageal reflux disease)    Hyperlipidemia    Hypertension    PAC (premature atrial contraction)     Past Surgical History:  Procedure Laterality Date   CERVICAL CONE BIOPSY     WHIPPLE PROCEDURE  04/2018    Current Medications: Current Meds  Medication Sig   ALPRAZolam (XANAX) 0.25 MG tablet Take 0.25 mg by mouth at bedtime as needed for anxiety.   amLODipine (NORVASC) 2.5 MG tablet Take 2.52 mg by mouth daily.   Ascorbic Acid (VITAMIN  C) 500 MG CHEW Chew 1 tablet by mouth daily.   atorvastatin (LIPITOR) 10 MG tablet Take 1 tablet (10 mg total) by mouth daily.   B Complex Vitamins (VITAMIN B COMPLEX PO) Take 1 tablet by mouth daily.   Biotin 2500 MCG CAPS Take 2 chews by mouth daily.   Cholecalciferol (VITAMIN D) 2000 UNITS CAPS Take 2,000 Units by mouth daily.    levothyroxine (SYNTHROID) 25 MCG tablet Take 25 mcg by mouth daily before breakfast.   MAGNESIUM PO Take 166 mg by mouth daily. Gummies   metoprolol succinate (TOPROL-XL) 50 MG 24 hr tablet TAKE 1 TABLET BY MOUTH ONCE DAILY . APPOINTMENT REQUIRED FOR FUTURE REFILLS   Multiple Vitamin (MULTI-VITAMINS) TABS Take 1 tablet by mouth daily.   Multiple Vitamins-Minerals (HAIR SKIN NAILS) CAPS Take 1 capsule by mouth daily.   Omega-3 Fatty Acids (FISH OIL) 300 MG CAPS Take 1 capsule by mouth daily.   omeprazole (PRILOSEC OTC) 20 MG tablet Take 20 mg by mouth every other day. Pt take '20mg'$  1 tab every other day.   Probiotic Product (PROBIOTIC DAILY) CAPS Take 1 capsule by mouth daily.   RA KRILL OIL CAPS Take 1 capsule by mouth daily.    spironolactone (ALDACTONE) 25 MG tablet Take 1 tablet (25 mg total) by mouth  daily.     Allergies:   Other, Atorvastatin, and Rosuvastatin   Social History   Socioeconomic History   Marital status: Married    Spouse name: Not on file   Number of children: Not on file   Years of education: Not on file   Highest education level: Not on file  Occupational History   Not on file  Tobacco Use   Smoking status: Never   Smokeless tobacco: Never  Vaping Use   Vaping Use: Never used  Substance and Sexual Activity   Alcohol use: No   Drug use: No   Sexual activity: Not on file  Other Topics Concern   Not on file  Social History Narrative   Not on file   Social Determinants of Health   Financial Resource Strain: Not on file  Food Insecurity: Not on file  Transportation Needs: Not on file  Physical Activity: Not on file  Stress:  Not on file  Social Connections: Not on file     Family History: The patient's family history includes Breast cancer (age of onset: 3) in her mother; Cancer in her mother; Heart disease in her father; Hyperlipidemia in her brother, brother, father, and sister; Hypertension in her father.  ROS:   Please see the history of present illness.     All other systems reviewed and are negative.  EKGs/Labs/Other Studies Reviewed:    The following studies were reviewed today:   EKG:  Feb. 26, 2024.   NSR at 64.   Normal   Recent Labs: No results found for requested labs within last 365 days.  Recent Lipid Panel    Component Value Date/Time   CHOL 133 01/08/2016 0730   TRIG 69 01/08/2016 0730   HDL 33 (L) 01/08/2016 0730   CHOLHDL 4.0 01/08/2016 0730   VLDL 14 01/08/2016 0730   LDLCALC 86 01/08/2016 0730     Risk Assessment/Calculations:      HYPERTENSION CONTROL Vitals:   04/22/22 0842 04/22/22 0925  BP: (!) 180/94 (!) 170/100    The patient's blood pressure is elevated above target today.  In order to address the patient's elevated BP: A new medication was prescribed today.            Physical Exam:    VS:  BP (!) 170/100   Pulse 64   Ht '5\' 2"'$  (1.575 m)   Wt 141 lb (64 kg)   SpO2 99%   BMI 25.79 kg/m     Wt Readings from Last 3 Encounters:  04/22/22 141 lb (64 kg)  03/02/21 140 lb 12.8 oz (63.9 kg)  01/19/20 135 lb 3.2 oz (61.3 kg)     GEN:  Well nourished, well developed in no acute distress HEENT: Normal NECK: No JVD; No carotid bruits LYMPHATICS: No lymphadenopathy CARDIAC: RRR, no murmurs, rubs, gallops RESPIRATORY:  Clear to auscultation without rales, wheezing or rhonchi  ABDOMEN: Soft, non-tender, non-distended MUSCULOSKELETAL:  No edema; No deformity  SKIN: Warm and dry NEUROLOGIC:  Alert and oriented x 3 PSYCHIATRIC:  Normal affect   ASSESSMENT:    1. Mixed hyperlipidemia   2. Primary hypertension   3. Essential hypertension   4.  Medication management    PLAN:    In order of problems listed above:  Hypertension: Taree presents with significantly elevated blood pressure.  She has significant whitecoat hypertension but even her home blood pressure readings have been elevated.  Will try her on spironolactone 25 mg a day.  Anticipate adding  an ARB at her next visit.  I will see her again in 4 weeks for follow-up visit.  Will check a basic metabolic profile at that time.  2.  Hyperlipidemia: Last LDL is 117.  She has a coronary calcium score of 7.9.  I think that she needs a lower LDL somewhere in the 70 range.  She is intolerant to higher dose atorvastatin and intolerant to rosuvastatin.  Will refer her to the lipid clinic for consideration of a PCSK9 inhibitor or inclisiran.   3.  Palpitations: Clinically her palpitations sound like premature ventricular contractions.  I am not concerned about these PVCs at this time.  It may be related to anxiety.  Will continue to follow them.       Medication Adjustments/Labs and Tests Ordered: Current medicines are reviewed at length with the patient today.  Concerns regarding medicines are outlined above.  Orders Placed This Encounter  Procedures   Basic metabolic panel   AMB Referral to [facility]   EKG 12-Lead   Meds ordered this encounter  Medications   spironolactone (ALDACTONE) 25 MG tablet    Sig: Take 1 tablet (25 mg total) by mouth daily.    Dispense:  90 tablet    Refill:  1     Patient Instructions  Medication Instructions:   START TAKING SPIRONOLACTONE 25 MG BY MOUTH DAILY  *If you need a refill on your cardiac medications before your next appointment, please call your pharmacy*   You have been referred to SEE [place]    Lab Work:  IN 4 WEEKS SAME DAY AS YOU SEE DR. Chipper Koudelka FOR YOUR 4 WEEK FOLLOW-UP APPOINTMENT--CHECK BMET  If you have labs (blood work)  drawn today and your tests are completely normal, you will receive your results only by: MyChart Message (if you have MyChart) OR A paper copy in the mail If you have any lab test that is abnormal or we need to change your treatment, we will call you to review the results.    Follow-Up:  4 WEEKS WITH DR. Acie Fredrickson WITH A BMET SAME DAY    Signed, Mertie Moores, MD  04/22/2022 2:56 PM    [place]

## 2022-04-22 ENCOUNTER — Encounter: Payer: Self-pay | Admitting: Cardiovascular Disease

## 2022-04-22 ENCOUNTER — Ambulatory Visit: Payer: Medicare Other | Attending: Cardiovascular Disease | Admitting: Cardiovascular Disease

## 2022-04-22 VITALS — BP 170/100 | HR 64 | Ht 62.0 in | Wt 141.0 lb

## 2022-04-22 DIAGNOSIS — Z79899 Other long term (current) drug therapy: Secondary | ICD-10-CM | POA: Diagnosis present

## 2022-04-22 DIAGNOSIS — E782 Mixed hyperlipidemia: Secondary | ICD-10-CM | POA: Insufficient documentation

## 2022-04-22 DIAGNOSIS — I10 Essential (primary) hypertension: Secondary | ICD-10-CM | POA: Diagnosis present

## 2022-04-22 MED ORDER — SPIRONOLACTONE 25 MG PO TABS: 25.0000 mg | ORAL_TABLET | Freq: Every day | ORAL | 1 refills | Status: DC

## 2022-04-22 NOTE — Patient Instructions (Signed)
Medication Instructions:   START TAKING SPIRONOLACTONE 25 MG BY MOUTH DAILY  *If you need a refill on your cardiac medications before your next appointment, please call your pharmacy*   You have been referred to SEE [place]    Lab Work:  IN 4 WEEKS SAME DAY AS YOU SEE DR. NAHSER FOR YOUR 4 WEEK FOLLOW-UP APPOINTMENT--CHECK BMET  If you have labs (blood work) drawn today and your tests are completely normal, you will receive your results only by: MyChart Message (if you have MyChart) OR A paper copy in the mail If you have any lab test that is abnormal or we need to change your treatment, we will call you to review the results.    Follow-Up:  4 WEEKS WITH DR. Acie Fredrickson WITH A BMET SAME DAY

## 2022-05-26 ENCOUNTER — Encounter: Payer: Self-pay | Admitting: Cardiovascular Disease

## 2022-05-26 NOTE — Progress Notes (Unsigned)
Cardiology Office Note:    Date:  05/27/2022   ID:  Felicia, Obrien 06/01/1955, MRN [id number]  PCP:  Orvan July, NP (Inactive)   [facility] Providers Cardiologist:  Felicia Grooms, MD (Inactive)  New to [place]     Referring MD: Acie Fredrickson Felicia Cheng, MD   Chief Complaint  Patient presents with   Hyperlipidemia     Feb. 26, 2024   Felicia Obrien is a 67 y.o. female with a hx of hyperlipidemia, HTN, palpitations, aortic atherosclerosis  . Has previously been seen by Dr. Tamala Julian .  Is friends with Felicia Obrien,  I am meeting today for the first time   Coronary calcium score of 7.84. This was 10 th percentile for age and sex matched control.  Lipids have been primarily managed by her primary medical doctor at [facility].  Has significant white coat HTN    January, 2024 labs: Total cholesterol is 192 HDL is 61 LDL is 117 Triglyceride level is 81  Still eats salt  Exercises regularly,  walks , light weight   Farms  Horses, chickens, dogs   BP at home has been better by still quite variable   + family hx of CAD ( father had CAD at 61s)   Has occasional Palpitations , clinically the sound like premature ventricular contractions.  I told her that I am not concerned about these potation's.  She did not think that they were severe enough to warrant wearing an event monitor.   Felicia 1, 2024 Hind is seen for follow up of her HTN, HLD , palpitations  We added Spironolactone at her last visit, anticipate adding ARB if her BP remains elevated.   Hx of coronary artery cacification CAC score is 7.84 which is 67th percentile for age / sex matched controls  Her BP readings at home are much better  Some of her typical readings are   115/70 107/62 119/73   Past Medical History:  Diagnosis Date   Abnormal Pap smear of cervix    Chronic pancreatitis    GERD (gastroesophageal reflux disease)    Hyperlipidemia    Hypertension    PAC  (premature atrial contraction)     Past Surgical History:  Procedure Laterality Date   CERVICAL CONE BIOPSY     WHIPPLE PROCEDURE  04/2018    Current Medications: Current Meds  Medication Sig   ALPRAZolam (XANAX) 0.25 MG tablet Take 0.25 mg by mouth at bedtime as needed for anxiety.   amLODipine (NORVASC) 2.5 MG tablet Take 2.52 mg by mouth daily.   Ascorbic Acid (VITAMIN C) 500 MG CHEW Chew 1 tablet by mouth daily.   atorvastatin (LIPITOR) 10 MG tablet Take 1 tablet (10 mg total) by mouth daily.   B Complex Vitamins (VITAMIN B COMPLEX PO) Take 1 tablet by mouth daily.   Biotin 2500 MCG CAPS Take 2 chews by mouth daily.   Cholecalciferol (VITAMIN D) 2000 UNITS CAPS Take 2,000 Units by mouth daily.    levothyroxine (SYNTHROID) 50 MCG tablet Take 50 mcg by mouth every morning.   MAGNESIUM PO Take 166 mg by mouth daily. Gummies   metoprolol succinate (TOPROL-XL) 50 MG 24 hr tablet TAKE 1 TABLET BY MOUTH ONCE DAILY . APPOINTMENT REQUIRED FOR FUTURE REFILLS   Multiple Vitamin (MULTI-VITAMINS) TABS Take 1 tablet by mouth daily.   Multiple Vitamins-Minerals (HAIR SKIN NAILS) CAPS Take 1 capsule by mouth daily.   Omega-3 Fatty Acids (FISH OIL) 300 MG CAPS  Take 2 capsules by mouth daily.   omeprazole (PRILOSEC OTC) 20 MG tablet Take 20 mg by mouth every other day. Pt take 20mg  1 tab every other day.   Probiotic Product (PROBIOTIC DAILY) CAPS Take 1 capsule by mouth daily.   RA KRILL OIL CAPS Take 1 capsule by mouth daily.    spironolactone (ALDACTONE) 25 MG tablet Take 1 tablet (25 mg total) by mouth daily.     Allergies:   Other, Atorvastatin, and Rosuvastatin   Social History   Socioeconomic History   Marital status: Married    Spouse name: Not on file   Number of children: Not on file   Years of education: Not on file   Highest education level: Not on file  Occupational History   Not on file  Tobacco Use   Smoking status: Never   Smokeless tobacco: Never  Vaping Use   Vaping  Use: Never used  Substance and Sexual Activity   Alcohol use: No   Drug use: No   Sexual activity: Not on file  Other Topics Concern   Not on file  Social History Narrative   Not on file   Social Determinants of Health   Financial Resource Strain: Not on file  Food Insecurity: Not on file  Transportation Needs: Not on file  Physical Activity: Not on file  Stress: Not on file  Social Connections: Not on file     Family History: The patient's family history includes Breast cancer (age of onset: 85) in her mother; Cancer in her mother; Heart disease in her father; Hyperlipidemia in her brother, brother, father, and sister; Hypertension in her father.  ROS:   Please see the history of present illness.     All other systems reviewed and are negative.  EKGs/Labs/Other Studies Reviewed:    The following studies were reviewed today:   EKG:     Recent Labs: No results found for requested labs within last 365 days.  Recent Lipid Panel    Component Value Date/Time   CHOL 133 01/08/2016 0730   TRIG 69 01/08/2016 0730   HDL 33 (L) 01/08/2016 0730   CHOLHDL 4.0 01/08/2016 0730   VLDL 14 01/08/2016 0730   LDLCALC 86 01/08/2016 0730     Risk Assessment/Calculations:           Physical Exam:    Physical Exam: Blood pressure 135/85, pulse 63, height 5\' 2"  (1.575 m), weight 141 lb 6.4 oz (64.1 kg), SpO2 97 %.       GEN:  Well nourished, well developed in no acute distress HEENT: Normal NECK: No JVD; No carotid bruits LYMPHATICS: No lymphadenopathy CARDIAC: RRR , no murmurs, rubs, gallops RESPIRATORY:  Clear to auscultation without rales, wheezing or rhonchi  ABDOMEN: Soft, non-tender, non-distended MUSCULOSKELETAL:  No edema; No deformity  SKIN: Warm and dry NEUROLOGIC:  Alert and oriented x 3   ASSESSMENT:    No diagnosis found.  PLAN:       Hypertension: Blood pressure is very well-controlled.  I would like to discontinue the amlodipine and substitute  losartan 50 mg a day.  Will check a basic metabolic profile today and again when I see her again in 6 weeks.  2.  Hyperlipidemia: Lipids are well-controlled.   3.  Palpitations:   Palpitations are better.      Medication Adjustments/Labs and Tests Ordered: Current medicines are reviewed at length with the patient today.  Concerns regarding medicines are outlined above.  No orders of the  defined types were placed in this encounter.  No orders of the defined types were placed in this encounter.    There are no Patient Instructions on file for this visit.   Signed, Mertie Moores, MD  05/27/2022 9:05 AM    [place]

## 2022-05-27 ENCOUNTER — Ambulatory Visit: Payer: Medicare Other | Attending: Cardiovascular Disease | Admitting: Cardiovascular Disease

## 2022-05-27 ENCOUNTER — Ambulatory Visit: Payer: Medicare Other

## 2022-05-27 ENCOUNTER — Encounter: Payer: Self-pay | Admitting: Cardiovascular Disease

## 2022-05-27 VITALS — BP 135/85 | HR 63 | Ht 62.0 in | Wt 141.4 lb

## 2022-05-27 DIAGNOSIS — Z79899 Other long term (current) drug therapy: Secondary | ICD-10-CM | POA: Insufficient documentation

## 2022-05-27 DIAGNOSIS — I10 Essential (primary) hypertension: Secondary | ICD-10-CM

## 2022-05-27 DIAGNOSIS — E782 Mixed hyperlipidemia: Secondary | ICD-10-CM | POA: Diagnosis present

## 2022-05-27 MED ORDER — LOSARTAN POTASSIUM 50 MG PO TABS: 50.0000 mg | ORAL_TABLET | Freq: Every day | ORAL | 3 refills | Status: DC

## 2022-05-27 NOTE — Patient Instructions (Signed)
Medication Instructions:  STOP Amlodipine START Losartan 50mg  daily *If you need a refill on your cardiac medications before your next appointment, please call your pharmacy*   Lab Work: BMET today Repeat BMET in 6 weeks (in next appt) If you have labs (blood work) drawn today and your tests are completely normal, you will receive your results only by: [place] (if you have MyChart) OR A paper copy in the mail If you have any lab test that is abnormal or we need to change your treatment, we will call you to review the results.   Testing/Procedures: NONE   Follow-Up: At [facility], you and your health needs are our priority.  As part of our continuing mission to provide you with exceptional heart care, we have created designated Provider Care Teams.  These Care Teams include your primary Cardiologist (physician) and Advanced Practice Providers (APPs -  Physician Assistants and Nurse Practitioners) who all work together to provide you with the care you need, when you need it.  We recommend signing up for the patient portal called "MyChart".  Sign up information is provided on this After Visit Summary.  MyChart is used to connect with patients for Virtual Visits (Telemedicine).  Patients are able to view lab/test results, encounter notes, upcoming appointments, etc.  Non-urgent messages can be sent to your provider as well.   To learn more about what you can do with MyChart, go to [email or web address].    Your next appointment:   6 week(s)  Provider:   Mertie Moores, MD

## 2022-05-28 LAB — BASIC METABOLIC PANEL
BUN/Creatinine Ratio: 15 (ref 12–28)
BUN: 13 mg/dL (ref 8–27)
CO2: 25 mmol/L (ref 20–29)
Calcium: 9.7 mg/dL (ref 8.7–10.3)
Chloride: 104 mmol/L (ref 96–106)
Creatinine, Ser: 0.87 mg/dL (ref 0.57–1.00)
Glucose: 95 mg/dL (ref 70–99)
Potassium: 4.9 mmol/L (ref 3.5–5.2)
Sodium: 142 mmol/L (ref 134–144)
eGFR: 73 mL/min/{1.73_m2} (ref >59)

## 2022-05-29 ENCOUNTER — Ambulatory Visit: Payer: PRIVATE HEALTH INSURANCE

## 2022-05-29 NOTE — Progress Notes (Deleted)
Patient ID: Felicia Obrien                 DOB: 15-Jan-1956                    MRN: [id number]      HPI: Felicia Obrien is a 67 y.o. female patient referred to lipid clinic by Dr. Acie Fredrickson PMH is significant for hyperlipidemia, HTN, palpitations, aortic atherosclerosis. Coronary calcium score of 7.84. This was 55 th percentile for age and sex matched control. On May 27, 2022 amlodipine 2.5 mg  was replaced with losartan 50 mg. Patient has significant white coat syndrome.  January, 2024 labs: Total cholesterol is 192 HDL is 61 LDL is 117 Triglyceride level is 81  Reviewed options for lowering LDL cholesterol, including ezetimibe, PCSK-9 inhibitors, bempedoic acid and inclisiran.  Discussed mechanisms of action, dosing, side effects and potential decreases in LDL cholesterol.  Also reviewed cost information and potential options for patient assistance.  Current Medications: Lipitor 10 mg daily (taking since Jan 2024) Intolerances: Crestor 20 mg and ezetimibe 10 mg, atorvastatin >10 mg-myalgias Risk Factors: HTN, aortic atherosclerosis, family hx of CAD LDL goal: <70 mg/dl  BP medications: Losartan 50 mg daily metoprolol XL 50 mg daily, spironolactone 25 mg daily  Previously tried: amlodipine 2.5 mg daily  BP goal: <130/80  Diet:   Exercise:   Family History:   Social History:   Labs: Lipid Panel     Component Value Date/Time   CHOL 133 01/08/2016 0730   TRIG 69 01/08/2016 0730   HDL 33 (L) 01/08/2016 0730   CHOLHDL 4.0 01/08/2016 0730   VLDL 14 01/08/2016 0730   LDLCALC 86 01/08/2016 0730    Past Medical History:  Diagnosis Date   Abnormal Pap smear of cervix    Chronic pancreatitis    GERD (gastroesophageal reflux disease)    Hyperlipidemia    Hypertension    PAC (premature atrial contraction)     Current Outpatient Medications on File Prior to Visit  Medication Sig Dispense Refill   ALPRAZolam (XANAX) 0.25 MG tablet Take 0.25 mg by mouth at bedtime  as needed for anxiety.     Ascorbic Acid (VITAMIN C) 500 MG CHEW Chew 1 tablet by mouth daily.     atorvastatin (LIPITOR) 10 MG tablet Take 1 tablet (10 mg total) by mouth daily. 90 tablet 3   B Complex Vitamins (VITAMIN B COMPLEX PO) Take 1 tablet by mouth daily.     Biotin 2500 MCG CAPS Take 2 chews by mouth daily.     Cholecalciferol (VITAMIN D) 2000 UNITS CAPS Take 2,000 Units by mouth daily.      levothyroxine (SYNTHROID) 50 MCG tablet Take 50 mcg by mouth every morning.     losartan (COZAAR) 50 MG tablet Take 1 tablet (50 mg total) by mouth daily. 90 tablet 3   MAGNESIUM PO Take 166 mg by mouth daily. Gummies     metoprolol succinate (TOPROL-XL) 50 MG 24 hr tablet TAKE 1 TABLET BY MOUTH ONCE DAILY . APPOINTMENT REQUIRED FOR FUTURE REFILLS 90 tablet 3   Multiple Vitamin (MULTI-VITAMINS) TABS Take 1 tablet by mouth daily.     Multiple Vitamins-Minerals (HAIR SKIN NAILS) CAPS Take 1 capsule by mouth daily.     Omega-3 Fatty Acids (FISH OIL) 300 MG CAPS Take 2 capsules by mouth daily.     omeprazole (PRILOSEC OTC) 20 MG tablet Take 20 mg by mouth every other day. Pt take 20mg  1  tab every other day.     Probiotic Product (PROBIOTIC DAILY) CAPS Take 1 capsule by mouth daily.     RA KRILL OIL CAPS Take 1 capsule by mouth daily.      spironolactone (ALDACTONE) 25 MG tablet Take 1 tablet (25 mg total) by mouth daily. 90 tablet 1   No current facility-administered medications on file prior to visit.    Allergies  Allergen Reactions   Other Other (See Comments)    Dairy products cause painful red skin welts,  abdominal bloating, aching, constipation   Atorvastatin Other (See Comments)    Muscle cramps with dose over 10 mg    Rosuvastatin Other (See Comments)    Muscle cramps and nausea on 20 mg daily dose    Assessment/Plan:  1. Hyperlipidemia -  No problems updated. No problem-specific Assessment & Plan notes found for this encounter.    Thank you,  Cammy Copa, Pharm.D [facility] Division of [facility] [address] [place], [place] [address]  Phone: [phone]; Fax: [phone]

## 2022-06-05 ENCOUNTER — Other Ambulatory Visit: Payer: Self-pay

## 2022-06-05 MED ORDER — METOPROLOL SUCCINATE ER 50 MG PO TB24: ORAL_TABLET | ORAL | 3 refills | Status: DC

## 2022-06-17 ENCOUNTER — Ambulatory Visit
Admission: RE | Admit: 2022-06-17 | Discharge: 2022-06-17 | Disposition: A | Discharge status: Home / Self Care | Payer: Medicare Other | Source: Ambulatory Visit | Attending: Family Medicine | Admitting: Family Medicine

## 2022-06-17 DIAGNOSIS — Z1231 Encounter for screening mammogram for malignant neoplasm of breast: Secondary | ICD-10-CM

## 2022-06-18 ENCOUNTER — Other Ambulatory Visit: Payer: Self-pay | Admitting: Cardiovascular Disease

## 2022-06-18 ENCOUNTER — Ambulatory Visit: Payer: Medicare Other | Attending: Interventional Cardiology | Admitting: Pharmacist

## 2022-06-18 DIAGNOSIS — E782 Mixed hyperlipidemia: Secondary | ICD-10-CM

## 2022-06-18 MED ORDER — ATORVASTATIN CALCIUM 10 MG PO TABS: 15.0000 mg | ORAL_TABLET | Freq: Every day | ORAL | 3 refills | Status: DC

## 2022-06-18 NOTE — Assessment & Plan Note (Addendum)
Assessment: LDL-C of 117 is above goal of less than 70 due to CAC score We did talk about her CAC score her 10-year ASCVD risk-Mesa score with CAC 4.5% Reviewed options with patient including PCSK9, inclisiran and Nexletol Inclisiran although lacks MACE data would be the least expensive option. It is quite possible that patient's LDL-C baseline is greater than 190 given the fact that someone started her on cholesterol medications in her 30s.  Therefore it is possible that she has familiar hyperlipidemia.   We did discuss checking an LP(a) if patient desires.   Plan: Patient is not ready to add injectable medications at this time  We did discuss the differences between atorvastatin and rosuvastatin.  There is a possibility of getting better reduction switching but is not a guarantee.   Patient would like to increase atorvastatin to 15 mg daily.  She is aware that this probably will only give [place] a 3-ish percent additional reduction She sees her PCP in July and we will ask her to order a lipid panel Add in resistance training

## 2022-06-18 NOTE — Progress Notes (Signed)
Patient ID: Felicia Obrien                 DOB: 10-13-1955                    MRN: [id number]      HPI: Felicia Obrien is a 67 y.o. female patient referred to lipid clinic by Dr. Elease Hashimoto. PMH is significant for hyperlipidemia, HTN, palpitations, aortic atherosclerosis, coronary calcium score of 7.84. This was 58th percentile for age and sex matched control.   This individual has an estimated 10-year risk of ASCVD = 4.3% MESA 10 Year risk of a CHD Event 4.5% Coronary Age 88 Difference from Chronologic Age +27  Patient presents to lipid clinic.  She reports that she has been on cholesterol medication since her 30s.  She is not sure what her baseline LDL cholesterol is or if she has ever had an LP(a) checked.  She thinks it might have been above 200 but not sure if that was total cholesterol or LDL-C.  She tolerates atorvastatin 10 mg daily fine.  Is very hesitant about adding additional medication.  [place] very [place]  She also does go to the gym.  No resistance training however. In the past this made significant changes to her diet which did not result in any improvement in her lipid panel.   Reviewed options for lowering LDL cholesterol, including ezetimibe, PCSK-9 inhibitors, bempedoic acid and inclisiran.  Discussed mechanisms of action, dosing, side effects and potential decreases in LDL cholesterol.  Also reviewed cost information and potential options for patient assistance.   Current Medications: atorvastatin  daily Intolerances:  Atorvastatin  and Rosuvastatin  (muscle cramps), ezetimibe (unknown) Risk Factors: Potentially familiar hyperlipidemia LDL-C goal: <70 ApoB goal: <80  Diet: supper careful about fats due to whipple procedure, not many sweets  Exercise: 8-10,000 steps per day, works on [employer] farm, goes to [employer]  Family History: + family hx of CAD ( father had CAD at 72s)  The patient's family history includes Breast cancer  (age of onset: 59) in her mother; Cancer in her mother; Heart disease in her father; Hyperlipidemia in her brother, brother, father, and sister; Hypertension in her father.   Social History:  Social History   Socioeconomic History   Marital status: Married    Spouse name: Not on file   Number of children: Not on file   Years of education: Not on file   Highest education level: Not on file  Occupational History   Not on file  Tobacco Use   Smoking status: Never   Smokeless tobacco: Never  Vaping Use   Vaping Use: Never used  Substance and Sexual Activity   Alcohol use: No   Drug use: No   Sexual activity: Not on file  Other Topics Concern   Not on file  Social History Narrative   Not on file   Social Determinants of Health   Financial Resource Strain: Not on file  Food Insecurity: Not on file  Transportation Needs: Not on file  Physical Activity: Not on file  Stress: Not on file  Social Connections: Not on file  Intimate Partner Violence: Not on file     Labs: Lipid Panel  03/06/22 LDL-C 117, non-HDL 132, HDL-61, TG 81    Component Value Date/Time   CHOL 133 01/08/2016 0730   TRIG 69 01/08/2016 0730   HDL 33 (L) 01/08/2016 0730   CHOLHDL 4.0 01/08/2016  0730   VLDL 14 01/08/2016 0730   LDLCALC 86 01/08/2016 0730    Past Medical History:  Diagnosis Date   Abnormal Pap smear of cervix    Chronic pancreatitis    GERD (gastroesophageal reflux disease)    Hyperlipidemia    Hypertension    PAC (premature atrial contraction)     Current Outpatient Medications on File Prior to Visit  Medication Sig Dispense Refill   ALPRAZolam (XANAX) 0.25 MG tablet Take 0.25 mg by mouth at bedtime as needed for anxiety.     Ascorbic Acid (VITAMIN C) 500 MG CHEW Chew 1 tablet by mouth daily.     B Complex Vitamins (VITAMIN B COMPLEX PO) Take 1 tablet by mouth daily.     Biotin 2500 MCG CAPS Take 2 chews by mouth daily.     Cholecalciferol (VITAMIN D) 2000 UNITS CAPS Take 2,000  Units by mouth daily.      levothyroxine (SYNTHROID) 50 MCG tablet Take 50 mcg by mouth every morning.     losartan (COZAAR) 50 MG tablet Take 1 tablet (50 mg total) by mouth daily. 90 tablet 3   MAGNESIUM PO Take 166 mg by mouth daily. Gummies     metoprolol succinate (TOPROL-XL) 50 MG 24 hr tablet TAKE 1 TABLET BY MOUTH ONCE DAILY . 90 tablet 3   Multiple Vitamin (MULTI-VITAMINS) TABS Take 1 tablet by mouth daily.     Multiple Vitamins-Minerals (HAIR SKIN NAILS) CAPS Take 1 capsule by mouth daily.     Omega-3 Fatty Acids (FISH OIL) 300 MG CAPS Take 2 capsules by mouth daily.     omeprazole (PRILOSEC OTC) 20 MG tablet Take 20 mg by mouth every other day. Pt take  1 tab every other day.     Probiotic Product (PROBIOTIC DAILY) CAPS Take 1 capsule by mouth daily.     RA KRILL OIL CAPS Take 1 capsule by mouth daily.      spironolactone (ALDACTONE) 25 MG tablet Take 1 tablet (25 mg total) by mouth daily. 90 tablet 1   No current facility-administered medications on file prior to visit.    Allergies  Allergen Reactions   Other Other (See Comments)    Dairy products cause painful red skin welts,  abdominal bloating, aching, constipation   Atorvastatin Other (See Comments)    Muscle cramps with dose over 10 mg    Rosuvastatin Other (See Comments)    Muscle cramps and nausea on 20 mg daily dose    Assessment/Plan:  1. Hyperlipidemia -  HLD (hyperlipidemia) Assessment: LDL-C of 117 is above goal of less than 70 due to CAC score We did talk about her CAC score her 10-year ASCVD risk-Mesa score with CAC 4.5% Reviewed options with patient including PCSK9, inclisiran and Nexletol Inclisiran although lacks MACE data would be the least expensive option. It is quite possible that patient's LDL-C baseline is greater than 190 given the fact that someone started her on cholesterol medications in her 30s.  Therefore it is possible that she has familiar hyperlipidemia.   We did discuss checking  an LP(a) if patient desires.   Plan: Patient is not ready to add injectable medications at this time  We did discuss the differences between atorvastatin and rosuvastatin.  There is a possibility of getting better reduction switching but is not a guarantee.   Patient would like to increase atorvastatin to 15 mg daily.  She is aware that this probably will only give [place] a 3-ish percent additional reduction  She sees her PCP in July and we will ask her to order a lipid panel Add in resistance training    Thank you,  Olene Floss, Pharm.D, BCPS, CPP [facility] of [facility] [address] [place], [place] [address]  Phone: [phone]; Fax: [phone]

## 2022-06-18 NOTE — Patient Instructions (Addendum)
Increase atorvastatin to  daily  Other options include Repatha or Leqvio or Nexletol   Ask your PCP if she would order a lipid panel at your next appointment

## 2022-07-08 ENCOUNTER — Other Ambulatory Visit: Payer: Medicare Other

## 2022-07-08 ENCOUNTER — Encounter: Payer: Self-pay | Admitting: Cardiovascular Disease

## 2022-07-10 ENCOUNTER — Ambulatory Visit: Payer: Medicare Other | Admitting: Cardiovascular Disease

## 2022-07-10 ENCOUNTER — Ambulatory Visit: Payer: Medicare Other

## 2022-07-17 ENCOUNTER — Telehealth: Payer: Self-pay | Admitting: Cardiovascular Disease

## 2022-07-17 NOTE — Telephone Encounter (Signed)
Patient calling in to report feeling weak and fatigued x 2 days, now w/ dizziness.   Last OV w/ Dr. Elease Hashimoto 05/27/22, BP was 135/85. She was taken off amlodipine and started on losartan 50 mg daily. She is also on toprol xl 50 mg daily and spironolactone 25 mg daily. She reports she usually takes all 3 of these meds together. Today her BP is 95/62 and she has not taken her meds. She has been drinking water in an attempt to alleviate her dizziness but she states there has not been much improvement though she has been resting.  Made patient a DOD appt for tomorrow and spoke to Dr. Clifton James (DOD for today) who advises patient not to take any BP meds today. If BP starts to creep up today, and if dizziness has improved, she may take her Toprol. Patient verbalizes understanding and agrees to plan. Advised patient to have someone drive her to our office tomorrow if she remains dizzy. Also discussed ED precautions (call 911 if dizziness and weakness progress rapidly or if she passes out.)

## 2022-07-17 NOTE — Telephone Encounter (Signed)
STAT if patient feels like he/she is going to faint   Are you dizzy now? Yes   Do you feel faint or have you passed out? Not right now, but has in the past. Started the last two days.    Do you have any other symptoms?   Have you checked your HR and BP (record if available)? 76 HR & 95/62 BP

## 2022-07-18 ENCOUNTER — Ambulatory Visit: Payer: Medicare Other | Attending: Interventional Cardiology | Admitting: Interventional Cardiology

## 2022-07-18 ENCOUNTER — Encounter: Payer: Self-pay | Admitting: Interventional Cardiology

## 2022-07-18 VITALS — BP 184/96 | HR 73 | Ht 62.0 in | Wt 136.4 lb

## 2022-07-18 DIAGNOSIS — I10 Essential (primary) hypertension: Secondary | ICD-10-CM | POA: Diagnosis not present

## 2022-07-18 NOTE — Patient Instructions (Signed)
Medication Instructions:  Your physician recommends that you continue on your current medications as directed. Please refer to the Current Medication list given to you today.  *If you need a refill on your cardiac medications before your next appointment, please call your pharmacy*   Lab Work: none If you have labs (blood work) drawn today and your tests are completely normal, you will receive your results only by: MyChart Message (if you have MyChart) OR A paper copy in the mail If you have any lab test that is abnormal or we need to change your treatment, we will call you to review the results.   Testing/Procedures: none   Follow-Up: At [facility], you and your health needs are our priority.  As part of our continuing mission to provide you with exceptional heart care, we have created designated Provider Care Teams.  These Care Teams include your primary Cardiologist (physician) and Advanced Practice Providers (APPs -  Physician Assistants and Nurse Practitioners) who all work together to provide you with the care you need, when you need it.  We recommend signing up for the patient portal called "MyChart".  Sign up information is provided on this After Visit Summary.  MyChart is used to connect with patients for Virtual Visits (Telemedicine).  Patients are able to view lab/test results, encounter notes, upcoming appointments, etc.  Non-urgent messages can be sent to your provider as well.   To learn more about what you can do with MyChart, go to [email or web address].    Your next appointment:   July 1  Provider:   Dr Elease Hashimoto    Other Instructions  Continue to monitor BP at home.  If running greater than 130-140 systolic resume spironolactone.  Call to let us know if you restart this medication

## 2022-07-18 NOTE — Progress Notes (Signed)
Cardiology Office Note   Date:  07/18/2022   ID:  Felicia Obrien, Felicia Obrien [birth year], MRN [id number]  PCP:  Janace Aris, NP    No chief complaint on file.  HTN  Wt Readings from Last 3 Encounters:  07/18/22 136 lb 6.4 oz (61.9 kg)  05/27/22 141 lb 6.4 oz (64.1 kg)  04/22/22 141 lb (64 kg)       History of Present Illness: Felicia Obrien is a 67 y.o. female patient of Dr. Elease Hashimoto with hypertension.  Prior records show: "Coronary calcium score of 7.84. This was 13 th percentile for age and sex matched control.   Lipids have been primarily managed by her primary medical doctor at [facility]   Has significant white coat HTN "    Called the office yesterday: "Last OV w/ Dr. Elease Hashimoto 05/27/22, BP was 135/85. She was taken off amlodipine and started on losartan 50 mg daily. She is also on toprol xl 50 mg daily and spironolactone 25 mg daily. She reports she usually takes all 3 of these meds together. Today her BP is 95/62 and she has not taken her meds. She has been drinking water in an attempt to alleviate her dizziness." "Made patient a DOD appt for tomorrow and spoke to Dr. Clifton James (DOD for today) who advises patient not to take any BP meds today. If BP starts to creep up today, and if dizziness has improved, she may take her Toprol. "  No medicines other than Toprol since 3:00 yesterday and blood pressure this morning at home is still in the 100-120s systolic.   120s systolic after activity.  Denies : Chest pain.  Leg edema. Nitroglycerin use. Orthopnea. Palpitations. Paroxysmal nocturnal dyspnea. Shortness of breath. Syncope.    Feels dizziness with the low blood pressures.   Past Medical History:  Diagnosis Date   Abnormal Pap smear of cervix    Chronic pancreatitis (HCC)    GERD (gastroesophageal reflux disease)    Hyperlipidemia    Hypertension    PAC (premature atrial contraction)     Past Surgical History:  Procedure Laterality Date   CERVICAL CONE  BIOPSY     WHIPPLE PROCEDURE  04/2018     Current Outpatient Medications  Medication Sig Dispense Refill   ALPRAZolam (XANAX) 0.25 MG tablet Take 0.25 mg by mouth at bedtime as needed for anxiety.     Ascorbic Acid (VITAMIN C) 500 MG CHEW Chew 1 tablet by mouth daily.     aspirin EC 81 MG tablet Take by mouth.     atorvastatin (LIPITOR) 10 MG tablet Take 1 tablet (10 mg total) by mouth daily. 90 tablet 3   B Complex Vitamins (VITAMIN B COMPLEX PO) Take 1 tablet by mouth daily.     Biotin 2500 MCG CAPS Take 2 chews by mouth daily.     Cholecalciferol (VITAMIN D) 2000 UNITS CAPS Take 2,000 Units by mouth daily.      levothyroxine (SYNTHROID) 25 MCG tablet Take 25 mcg by mouth daily.     losartan (COZAAR) 50 MG tablet Take 1 tablet (50 mg total) by mouth daily. 90 tablet 3   MAGNESIUM PO Take 166 mg by mouth daily. Gummies     metoprolol succinate (TOPROL-XL) 50 MG 24 hr tablet TAKE 1 TABLET BY MOUTH ONCE DAILY . 90 tablet 3   Multiple Vitamin (MULTI-VITAMINS) TABS Take 1 tablet by mouth daily.     Multiple Vitamins-Minerals (HAIR SKIN NAILS) CAPS Take 1 capsule by  mouth daily.     Omega-3 Fatty Acids (FISH OIL) 300 MG CAPS Take 2 capsules by mouth daily.     omeprazole (PRILOSEC OTC) 20 MG tablet Take 20 mg by mouth every other day. Pt take 20mg  1 tab every other day.     Probiotic Product (PROBIOTIC DAILY) CAPS Take 1 capsule by mouth daily.     RA KRILL OIL CAPS Take 1 capsule by mouth daily.      spironolactone (ALDACTONE) 25 MG tablet Take 1 tablet (25 mg total) by mouth daily. 90 tablet 1   No current facility-administered medications for this visit.    Allergies:   Other, Atorvastatin, and Rosuvastatin    Social History:  The patient  reports that she has never smoked. She has never used smokeless tobacco. She reports that she does not drink alcohol and does not use drugs.   Family History:  The patient's family history includes Breast cancer (age of onset: 29) in her mother;  Cancer in her mother; Heart disease in her father; Hyperlipidemia in her brother, brother, father, and sister; Hypertension in her father.    ROS:  Please see the history of present illness.   Otherwise, review of systems are positive for lightheadedness.   All other systems are reviewed and negative.    PHYSICAL EXAM: VS:  BP (!) 184/96   Pulse 73   Ht 5\' 2"  (1.575 m)   Wt 136 lb 6.4 oz (61.9 kg)   SpO2 99%   BMI 24.95 kg/m  , BMI Body mass index is 24.95 kg/m. GEN: Well nourished, well developed, in no acute distress HEENT: normal Neck: no JVD, carotid bruits, or masses Cardiac: RRR; no murmurs, rubs, or gallops,no edema  Respiratory:  clear to auscultation bilaterally, normal work of breathing GI: soft, nontender, nondistended, + BS MS: no deformity or atrophy Skin: warm and dry, no rash Neuro:  Strength and sensation are intact Psych: euthymic mood, full affect   EKG:   The ekg ordered today demonstrates normal ECG   Recent Labs: 05/27/2022: BUN 13; Creatinine, Ser 0.87; Potassium 4.9; Sodium 142   Lipid Panel    Component Value Date/Time   CHOL 133 01/08/2016 0730   TRIG 69 01/08/2016 0730   HDL 33 (L) 01/08/2016 0730   CHOLHDL 4.0 01/08/2016 0730   VLDL 14 01/08/2016 0730   LDLCALC 86 01/08/2016 0730     Other studies Reviewed: Additional studies/ records that were reviewed today with results demonstrating: labs reviewed, normal Cr and Potassium 4.8 in 05/2022..   ASSESSMENT AND PLAN:  HTN: Now onmetoprolol ER for the past 24 hours.  BPs at home controlled.  Continue to monitor blood pressure.  If readings trend up to the high 130s to low 140s systolic, would add back spironolactone 25 mg daily.If blood pressures were to continue to go up from there, would add back losartan 25 mg daily. Has had an exaggerated reaction to losartan.  Add back meds slowly.  Has had issues with other medications being too effective and has had to decrease doses.    Current  medicines are reviewed at length with the patient today.  The patient concerns regarding her medicines were addressed.  The following changes have been made:  No change  Labs/ tests ordered today include:  No orders of the defined types were placed in this encounter.   Recommend 150 minutes/week of aerobic exercise Low fat, low carb, high fiber diet recommended  Disposition:   FU as scheduled  with Dr. Elease Hashimoto   Signed, Lance Muss, MD  07/18/2022 11:58 AM    [facility] [address] [place], [place], [place]  [address] Phone: [phone]; Fax: [phone]

## 2022-07-22 ENCOUNTER — Encounter: Payer: Self-pay | Admitting: Cardiovascular Disease

## 2022-08-19 ENCOUNTER — Other Ambulatory Visit: Payer: Medicare Other

## 2022-08-25 ENCOUNTER — Encounter: Payer: Self-pay | Admitting: Cardiovascular Disease

## 2022-08-25 NOTE — Progress Notes (Unsigned)
Cardiology Office Note:    Date:  08/27/2022   ID:  Felicia Obrien, Felicia Obrien August 20, 1955, MRN [id number]  PCP:  Janace Aris, NP   [facility] Providers Cardiologist:  Lesleigh Noe, MD (Inactive)  New to [facility]     Referring MD: Elease Hashimoto Deloris Ping, MD   Chief Complaint  Patient presents with   Hyperlipidemia     Feb. 26, 2024   Felicia Obrien is a 67 y.o. female with a hx of hyperlipidemia, HTN, palpitations, aortic atherosclerosis  . Has previously been seen by Dr. Katrinka Blazing .  Is friends with [name],  I am meeting today for the first time   Coronary calcium score of 7.84. This was 60 th percentile for age and sex matched control.  Lipids have been primarily managed by her primary medical doctor at [facility].  Has significant white coat HTN    January, 2024 labs: Total cholesterol is 192 HDL is 61 LDL is 117 Triglyceride level is 81  Still eats salt  Exercises regularly,  walks , light weight   Farms  Horses, chickens, dogs   BP at home has been better by still quite variable   + family hx of CAD ( father had CAD at 56s)   Has occasional Palpitations , clinically the sound like premature ventricular contractions.  I told her that I am not concerned about these potation's.  She did not think that they were severe enough to warrant wearing an event monitor.   May 27, 2022 [name] is seen for follow up of her HTN, HLD , palpitations  We added Spironolactone at her last visit, anticipate adding ARB if her BP remains elevated.   Hx of coronary artery cacification CAC score is 7.84 which is 58th percentile for age / sex matched controls  Her BP readings at home are much better  Some of her typical readings are   115/70 107/62 119/73  August 26, 2022  Was seen by Dr. Hedy Jacob as a DOD work in visit for hypotention  BP meds were cut  She has been making some adjustments  Losartan has now been stopped  She remains on Toprol XL 50 mg and  Spironolactone 50 mg  Feeling well   No dyspnea  Still eating salt    Past Medical History:  Diagnosis Date   Abnormal Pap smear of cervix    Chronic pancreatitis (HCC)    GERD (gastroesophageal reflux disease)    Hyperlipidemia    Hypertension    PAC (premature atrial contraction)     Past Surgical History:  Procedure Laterality Date   CERVICAL CONE BIOPSY     WHIPPLE PROCEDURE  04/2018    Current Medications: Current Meds  Medication Sig   ALPRAZolam (XANAX) 0.25 MG tablet Take 0.25 mg by mouth at bedtime as needed for anxiety.   Ascorbic Acid (VITAMIN C) 500 MG CHEW Chew 1 tablet by mouth daily.   aspirin EC 81 MG tablet Take by mouth.   atorvastatin (LIPITOR) 10 MG tablet Take 1 tablet (10 mg total) by mouth daily.   B Complex Vitamins (VITAMIN B COMPLEX PO) Take 1 tablet by mouth daily.   Biotin 2500 MCG CAPS Take 2 chews by mouth daily.   Cholecalciferol (VITAMIN D) 2000 UNITS CAPS Take 2,000 Units by mouth daily.    levothyroxine (SYNTHROID) 25 MCG tablet Take 25 mcg by mouth daily.   MAGNESIUM PO Take 166 mg by mouth daily. Gummies   metoprolol  succinate (TOPROL-XL) 50 MG 24 hr tablet TAKE 1 TABLET BY MOUTH ONCE DAILY .   Multiple Vitamin (MULTI-VITAMINS) TABS Take 1 tablet by mouth daily.   Multiple Vitamins-Minerals (HAIR SKIN NAILS) CAPS Take 1 capsule by mouth daily.   Omega-3 Fatty Acids (FISH OIL) 300 MG CAPS Take 2 capsules by mouth daily.   omeprazole (PRILOSEC OTC) 20 MG tablet Take 20 mg by mouth every other day. Pt take 20mg  1 tab every other day.   Probiotic Product (PROBIOTIC DAILY) CAPS Take 1 capsule by mouth daily.   RA KRILL OIL CAPS Take 1 capsule by mouth daily.    spironolactone (ALDACTONE) 25 MG tablet Take 25 mg by mouth daily.     Allergies:   Other, Atorvastatin, and Rosuvastatin   Social History   Socioeconomic History   Marital status: Married    Spouse name: Not on file   Number of children: Not on file   Years of education: Not  on file   Highest education level: Not on file  Occupational History   Not on file  Tobacco Use   Smoking status: Never   Smokeless tobacco: Never  Vaping Use   Vaping Use: Never used  Substance and Sexual Activity   Alcohol use: No   Drug use: No   Sexual activity: Not on file  Other Topics Concern   Not on file  Social History Narrative   Not on file   Social Determinants of Health   Financial Resource Strain: Not on file  Food Insecurity: Not on file  Transportation Needs: Not on file  Physical Activity: Not on file  Stress: Not on file  Social Connections: Not on file     Family History: The patient's family history includes Breast cancer (age of onset: 42) in her mother; Cancer in her mother; Heart disease in her father; Hyperlipidemia in her brother, brother, father, and sister; Hypertension in her father.  ROS:   Please see the history of present illness.     All other systems reviewed and are negative.  EKGs/Labs/Other Studies Reviewed:    The following studies were reviewed today:   EKG:     Recent Labs: 08/26/2022: BUN 18; Creatinine, Ser 0.80; Hemoglobin WILL FOLLOW; Platelets WILL FOLLOW; Potassium 4.5; Sodium 142  Recent Lipid Panel    Component Value Date/Time   CHOL 133 01/08/2016 0730   TRIG 69 01/08/2016 0730   HDL 33 (L) 01/08/2016 0730   CHOLHDL 4.0 01/08/2016 0730   VLDL 14 01/08/2016 0730   LDLCALC 86 01/08/2016 0730     Risk Assessment/Calculations:           Physical Exam:     Physical Exam: Blood pressure 120/78, pulse (!) 52, height 5\' 2"  (1.575 m), weight 139 lb (63 kg), SpO2 98 %.       GEN:  Well nourished, well developed in no acute distress HEENT: Normal NECK: No JVD; No carotid bruits LYMPHATICS: No lymphadenopathy CARDIAC: RRR  soft systolic murmur , radiating to left ax line below left breast   RESPIRATORY:  Clear to auscultation without rales, wheezing or rhonchi  ABDOMEN: soft palpable midline mass  associated with an abdominal bruit  MUSCULOSKELETAL:  No edema; No deformity  SKIN: Warm and dry NEUROLOGIC:  Alert and oriented x 3    ASSESSMENT:    1. Dissection of abdominal aorta (HCC)   2. Primary hypertension     PLAN:       Hypertension:   BP is  well controlled.   Continue current meds   2.  Hyperlipidemia: LDL is 86.  Cont meds, low fat, low chol diet    3.  Palpitations:   Palpitations are better.  4.  Infrarenal aortic dissection :   CT of her abdomen performed as a follow-up to her Whipple procedure revealed an ulceration of her intra renal aorta versus a focal dissection.  She has a palpable midline mass and bruit associated with this.  She will continue to follow-up with the vascular group at [facility].  Alternatively, we could transfer her to our vein and vascular specialist here if she would like.  Will continue with close monitoring of her blood pressure including beta-blockade to help reduce the shear stress on her aorta.    5.  Anemia:   she has been anemic in the past. Her last Hb was 11 Will check CBC today     Medication Adjustments/Labs and Tests Ordered: Current medicines are reviewed at length with the patient today.  Concerns regarding medicines are outlined above.  No orders of the defined types were placed in this encounter.  No orders of the defined types were placed in this encounter.    Patient Instructions  Medication Instructions:  Your physician recommends that you continue on your current medications as directed. Please refer to the Current Medication list given to you today.  *If you need a refill on your cardiac medications before your next appointment, please call your pharmacy*   Lab Work: None  If you have labs (blood work) drawn today and your tests are completely normal, you will receive your results only by: MyChart Message (if you have MyChart) OR A paper copy in the mail If you have any lab test that is abnormal or we  need to change your treatment, we will call you to review the results.   Testing/Procedures: None   Follow-Up: At [facility], you and your health needs are our priority.  As part of our continuing mission to provide you with exceptional heart care, we have created designated Provider Care Teams.  These Care Teams include your primary Cardiologist (physician) and Advanced Practice Providers (APPs -  Physician Assistants and Nurse Practitioners) who all work together to provide you with the care you need, when you need it.     Your next appointment:   6 month(s)  Provider:   Kristeen Miss, MD     Signed, Kristeen Miss, MD  08/27/2022 1:47 PM    [facility]

## 2022-08-26 ENCOUNTER — Ambulatory Visit: Payer: Medicare Other | Attending: Cardiovascular Disease | Admitting: Cardiovascular Disease

## 2022-08-26 ENCOUNTER — Ambulatory Visit: Payer: Medicare Other

## 2022-08-26 ENCOUNTER — Encounter: Payer: Self-pay | Admitting: Cardiovascular Disease

## 2022-08-26 VITALS — BP 120/78 | HR 52 | Ht 62.0 in | Wt 139.0 lb

## 2022-08-26 DIAGNOSIS — E782 Mixed hyperlipidemia: Secondary | ICD-10-CM | POA: Diagnosis present

## 2022-08-26 DIAGNOSIS — I7102 Dissection of abdominal aorta: Secondary | ICD-10-CM | POA: Insufficient documentation

## 2022-08-26 DIAGNOSIS — I10 Essential (primary) hypertension: Secondary | ICD-10-CM | POA: Insufficient documentation

## 2022-08-26 DIAGNOSIS — I7100 Dissection of unspecified site of aorta: Secondary | ICD-10-CM | POA: Insufficient documentation

## 2022-08-26 NOTE — Patient Instructions (Signed)
Medication Instructions:  Your physician recommends that you continue on your current medications as directed. Please refer to the Current Medication list given to you today.  *If you need a refill on your cardiac medications before your next appointment, please call your pharmacy*   Lab Work: None  If you have labs (blood work) drawn today and your tests are completely normal, you will receive your results only by: MyChart Message (if you have MyChart) OR A paper copy in the mail If you have any lab test that is abnormal or we need to change your treatment, we will call you to review the results.   Testing/Procedures: None   Follow-Up: At [facility], you and your health needs are our priority.  As part of our continuing mission to provide you with exceptional heart care, we have created designated Provider Care Teams.  These Care Teams include your primary Cardiologist (physician) and Advanced Practice Providers (APPs -  Physician Assistants and Nurse Practitioners) who all work together to provide you with the care you need, when you need it.     Your next appointment:   6 month(s)  Provider:   Kristeen Miss, MD

## 2022-08-27 ENCOUNTER — Other Ambulatory Visit: Payer: Self-pay

## 2022-08-27 ENCOUNTER — Telehealth: Payer: Self-pay | Admitting: Cardiovascular Disease

## 2022-08-27 DIAGNOSIS — D649 Anemia, unspecified: Secondary | ICD-10-CM

## 2022-08-27 LAB — SPECIMEN STATUS

## 2022-08-27 LAB — SPECIMEN STATUS REPORT

## 2022-08-27 LAB — BASIC METABOLIC PANEL
BUN/Creatinine Ratio: 23 (ref 12–28)
BUN: 18 mg/dL (ref 8–27)
CO2: 26 mmol/L (ref 20–29)
Calcium: 10.2 mg/dL (ref 8.7–10.3)
Chloride: 105 mmol/L (ref 96–106)
Creatinine, Ser: 0.8 mg/dL (ref 0.57–1.00)
Glucose: 91 mg/dL (ref 70–99)
Potassium: 4.5 mmol/L (ref 3.5–5.2)
Sodium: 142 mmol/L (ref 134–144)
eGFR: 81 mL/min/{1.73_m2} (ref >59)

## 2022-08-27 NOTE — Telephone Encounter (Signed)
Calling to see if an extra tube was sent to them by accident. Please advise

## 2022-08-27 NOTE — Telephone Encounter (Signed)
Extra tube of blood pulled in preparation for added CBC for mild anemia follow up.  CBC order placed 7/2 per Dr. Elease Hashimoto.

## 2022-09-03 ENCOUNTER — Ambulatory Visit
Admission: RE | Admit: 2022-09-03 | Discharge: 2022-09-03 | Disposition: A | Discharge status: Home / Self Care | Payer: Medicare Other | Source: Ambulatory Visit | Attending: Family Medicine | Admitting: Family Medicine

## 2022-09-03 DIAGNOSIS — M8580 Other specified disorders of bone density and structure, unspecified site: Secondary | ICD-10-CM

## 2022-09-24 ENCOUNTER — Telehealth: Payer: Self-pay | Admitting: Pharmacist

## 2022-09-24 ENCOUNTER — Ambulatory Visit: Payer: Medicare Other | Admitting: Cardiovascular Disease

## 2022-09-24 NOTE — Telephone Encounter (Signed)
LDL-C is 117, non-HDL 132, TG 76 on 09/10/25 at PCP office Essentially unchanged.  Called pt and LVM to discuss. Seen previously in the office in April. Discussed medication options. Patient increased atorvastatin to 15mg  despite warning that this would most likely make no difference.

## 2022-10-01 NOTE — Telephone Encounter (Signed)
Spoke to patient. She wants to go back a take a look at what her PCP said about her labs and call me back. Did not tolerate 15mg  of atorvastatin.

## 2022-10-11 ENCOUNTER — Encounter: Payer: Self-pay | Admitting: Cardiovascular Disease

## 2022-10-11 ENCOUNTER — Other Ambulatory Visit: Payer: Self-pay | Admitting: Cardiovascular Disease

## 2022-10-14 NOTE — Telephone Encounter (Signed)
Per OV on 08/26/22 w/ Nahser: BP meds were cut  She has been making some adjustments  Losartan has now been stopped  She remains on Toprol XL 50 mg and Spironolactone 50 mg  Feeling well  Hypertension:   BP is well controlled.   Continue current meds   Patient states she is only using Spironolactone 25mg  daily and requesting refill. Medication sent to pharmacy per request on 10/11/22.

## 2022-11-04 ENCOUNTER — Telehealth: Payer: Self-pay | Admitting: Cardiovascular Disease

## 2022-11-04 NOTE — Telephone Encounter (Signed)
Patient has her 66M f/u scheduled with Dr. Elease Hashimoto for 03/04/23. Patient was wanting to know if Dr. Elease Hashimoto was wanting her to have labs drawn before her visit. Please advise.

## 2022-11-04 NOTE — Telephone Encounter (Signed)
Returned call to patient to let her know that we would want to see results of Lipids, ALT, BMET prior to seeing her in January. Questioned if she would be seeing her PCP prior. She states not, but is scheduled a week from our appt. Instead of ordering labs here, she asks that we move our appt farther out in January and she will have them done at that office. Appt rescheduled for 03/24/22. No further questions.

## 2023-03-03 ENCOUNTER — Other Ambulatory Visit: Payer: Self-pay

## 2023-03-03 MED ORDER — METOPROLOL SUCCINATE ER 50 MG PO TB24: ORAL_TABLET | ORAL | 1 refills | Status: DC

## 2023-03-03 MED ORDER — SPIRONOLACTONE 25 MG PO TABS: 25.0000 mg | ORAL_TABLET | Freq: Every day | ORAL | 1 refills | Status: DC

## 2023-03-03 MED ORDER — ATORVASTATIN CALCIUM 10 MG PO TABS: 10.0000 mg | ORAL_TABLET | Freq: Every day | ORAL | 1 refills | Status: DC

## 2023-03-04 ENCOUNTER — Ambulatory Visit: Payer: Medicare Other | Admitting: Cardiovascular Disease

## 2023-03-23 ENCOUNTER — Encounter: Payer: Self-pay | Admitting: Cardiovascular Disease

## 2023-03-23 NOTE — Progress Notes (Unsigned)
Cardiology Office Note:    Date:  03/25/2023   ID:  Felicia Obrien, Felicia Obrien 07/21/1955, MRN [id number]  PCP:  Janace Aris, FNP   [facility] Providers Cardiologist:  Lesleigh Noe, MD (Inactive)  New to Felicia Obrien     Referring MD: Janace Aris, FNP   Chief Complaint  Patient presents with   Hyperlipidemia     Feb. 26, 2024   Felicia Obrien is a 68 y.o. female with a hx of hyperlipidemia, HTN, palpitations, aortic atherosclerosis  . Has previously been seen by Dr. Katrinka Blazing .  Is friends with Felicia Obrien,  I am meeting today for the first time   Coronary calcium score of 7.84. This was 35 th percentile for age and sex matched control.  Lipids have been primarily managed by her primary medical doctor at [facility]  Has significant white coat HTN    January, 2024 labs: Total cholesterol is 192 HDL is 61 LDL is 117 Triglyceride level is 81  Still eats salt  Exercises regularly,  walks , light weight   Farms  Horses, chickens, dogs   BP at home has been better by still quite variable   + family hx of CAD ( father had CAD at 67s)   Has occasional Palpitations , clinically the sound like premature ventricular contractions.  I told her that I am not concerned about these potation's.  She did not think that they were severe enough to warrant wearing an event monitor.   Felicia 1, 2024 Felicia Obrien is seen for follow up of her HTN, HLD , palpitations  We added Spironolactone at her last visit, anticipate adding ARB if her BP remains elevated.   Hx of coronary artery cacification CAC score is 7.84 which is 58th percentile for age / sex matched controls  Her BP readings at home are much better  Some of her typical readings are   115/70 107/62 119/73  August 26, 2022  Was seen by Dr. Hedy Jacob as a DOD work in visit for hypotention  BP meds were cut  She has been making some adjustments  Losartan has now been stopped  She remains on Toprol XL 50 mg and  Spironolactone 50 mg  Feeling well   No dyspnea  Still eating salt   Mar 25, 2023 Felicia Obrien is seen for follow up of her HLD, HTN, CAC , PAD (followed by [facility] )  BP has been ok / high normal Has not been as careful with her salt recently  We have tried her on Losartan in the past - this cause hypotension and we stopped   No Cp , no dyspnea  Has left sided chest twinges ,   Is not associated with exertion   Labs from her primary medical doctor on March 12, 2023 include total cholesterol is 202 HDL is 52  LDL is 130  Triglyceride levels 117  She is on atorvastatin 10 mg a day  She is intolerant to high dose atorva  Has also tried rosuvastatin  she also thinks that she has tried Zetia and was intolerant to that.  We discussed PCSK9 inhibitor  She is not interested at this time  She has seen our lipid clinic  She says her primary doctor is following her lipids closely    Hemoglobin A1c is 5.9 Creatinine 0.8 Potassium is 4.7     Past Medical History:  Diagnosis Date   Abnormal Pap smear of cervix  Chronic pancreatitis (HCC)    GERD (gastroesophageal reflux disease)    Hyperlipidemia    Hypertension    PAC (premature atrial contraction)     Past Surgical History:  Procedure Laterality Date   CERVICAL CONE BIOPSY     WHIPPLE PROCEDURE  04/2018    Current Medications: Current Meds  Medication Sig   ALPRAZolam (XANAX) 0.25 MG tablet Take 0.25 mg by mouth at bedtime as needed for anxiety.   Ascorbic Acid (VITAMIN C) 500 MG CHEW Chew 1 tablet by mouth daily.   aspirin EC 81 MG tablet Take by mouth.   atorvastatin (LIPITOR) 10 MG tablet Take 1 tablet (10 mg total) by mouth daily.   B Complex Vitamins (VITAMIN B COMPLEX PO) Take 1 tablet by mouth daily.   Biotin 2500 MCG CAPS Take 2 chews by mouth daily.   Cholecalciferol (VITAMIN D) 2000 UNITS CAPS Take 2,000 Units by mouth daily.    cyanocobalamin 100 MCG tablet Take 100 mcg by mouth daily.    famotidine (PEPCID) 10 MG tablet Take 10 mg by mouth 2 (two) times daily.   levothyroxine (SYNTHROID) 25 MCG tablet Take 25 mcg by mouth daily.   MAGNESIUM PO Take 166 mg by mouth daily. Gummies   metoprolol succinate (TOPROL-XL) 50 MG 24 hr tablet TAKE 1 TABLET BY MOUTH ONCE DAILY .   Multiple Vitamin (MULTI-VITAMINS) TABS Take 1 tablet by mouth daily.   Multiple Vitamins-Minerals (HAIR SKIN NAILS) CAPS Take 1 capsule by mouth daily.   Omega-3 Fatty Acids (FISH OIL) 300 MG CAPS Take 2 capsules by mouth daily.   Probiotic Product (PROBIOTIC DAILY) CAPS Take 1 capsule by mouth daily.   RA KRILL OIL CAPS Take 1 capsule by mouth daily.    spironolactone (ALDACTONE) 25 MG tablet Take 1 tablet (25 mg total) by mouth daily.     Allergies:   Other, Atorvastatin, and Rosuvastatin   Social History   Socioeconomic History   Marital status: Married    Spouse name: Not on file   Number of children: Not on file   Years of education: Not on file   Highest education level: Not on file  Occupational History   Not on file  Tobacco Use   Smoking status: Never   Smokeless tobacco: Never  Vaping Use   Vaping status: Never Used  Substance and Sexual Activity   Alcohol use: No   Drug use: No   Sexual activity: Not on file  Other Topics Concern   Not on file  Social History Narrative   Not on file   Social Drivers of Health   Financial Resource Strain: Not on file  Food Insecurity: Not on file  Transportation Needs: Not on file  Physical Activity: Not on file  Stress: Not on file  Social Connections: Not on file     Family History: The patient's family history includes Breast cancer (age of onset: 50) in her mother; Cancer in her mother; Heart disease in her father; Hyperlipidemia in her brother, brother, father, and sister; Hypertension in her father.  ROS:   Please see the history of present illness.     All other systems reviewed and are negative.  EKGs/Labs/Other Studies  Reviewed:    The following studies were reviewed today:   EKG:          Recent Labs: 08/26/2022: BUN 18; Creatinine, Ser 0.80; Hemoglobin WILL FOLLOW; Platelets WILL FOLLOW; Potassium 4.5; Sodium 142  Recent Lipid Panel    Component Value  Date/Time   CHOL 133 01/08/2016 0730   TRIG 69 01/08/2016 0730   HDL 33 (L) 01/08/2016 0730   CHOLHDL 4.0 01/08/2016 0730   VLDL 14 01/08/2016 0730   LDLCALC 86 01/08/2016 0730     Risk Assessment/Calculations:           Physical Exam:       Physical Exam: Blood pressure (!) 135/95, pulse (!) 54, height 5\' 2"  (1.575 m), weight 141 lb (64 kg), SpO2 99%.  HYPERTENSION CONTROL Vitals:   03/25/23 0823 03/25/23 0849  BP: (!) 150/82 (!) 135/95    The patient's blood pressure is elevated above target today.  In order to address the patient's elevated BP:        GEN:  Well nourished, well developed in no acute distress HEENT: Normal NECK: No JVD; No carotid bruits LYMPHATICS: No lymphadenopathy CARDIAC: RRR , no murmurs, rubs, gallops RESPIRATORY:  Clear to auscultation without rales, wheezing or rhonchi  ABDOMEN: Soft, non-tender, non-distended MUSCULOSKELETAL:  No edema; No deformity  SKIN: Warm and dry NEUROLOGIC:  Alert and oriented x 3      ASSESSMENT:    1. Primary hypertension   2. Mixed hyperlipidemia   3. Coronary artery calcification      PLAN:       Hypertension:    Blood pressure is still slightly elevated.  She admits to not being as careful with her salt intake.  She is hesitant to add any additional medications.  We have tried losartan in the past but it caused hypotension.  She has also tried amlodipine in the past and it caused leg edema.  She will try increasing her exercise and watching her salt.  She will see Eligha Bridegroom, NP in 3 months for follow-up visit.  I would have a low threshold to add a very low-dose losartan to see if we can get her blood pressure better controlled.  2.   Hyperlipidemia: Her LDL several weeks ago was 478.  She is on low-dose atorvastatin.  She does not tolerate high-dose atorvastatin or rosuvastatin.  She also thinks that she is tried Zetia but had some sort of reaction.  We once again discussed sending her to the lipid clinic for consideration of a PCSK9.  She is not interested in doing that at this time.    3.  Palpitations: Her palpitations are better on metoprolol.    4.  Infrarenal aortic dissection :   CT of her abdomen performed as a follow-up to her Whipple procedure revealed an ulceration of her intra renal aorta versus a focal dissection.  She has a palpable midline mass and bruit associated with this.  She will continue to follow-up with the vascular group at [facility].     5.  Anemia:   she has been anemic in the past.       Medication Adjustments/Labs and Tests Ordered: Current medicines are reviewed at length with the patient today.  Concerns regarding medicines are outlined above.  No orders of the defined types were placed in this encounter.  No orders of the defined types were placed in this encounter.    Patient Instructions  Medication Instructions:  Your physician recommends that you continue on your current medications as directed. Please refer to the Current Medication list given to you today.  *If you need a refill on your cardiac medications before your next appointment, please call your pharmacy*  Lab Work: If you have labs (blood work) drawn today and your tests  are completely normal, you will receive your results only by: MyChart Message (if you have MyChart) OR A paper copy in the mail If you have any lab test that is abnormal or we need to change your treatment, we will call you to review the results.  Testing/Procedures: None ordered today.  Follow-Up: At [facility], you and your health needs are our priority.  As part of our continuing mission to provide you with exceptional heart care, we  have created designated Provider Care Teams.  These Care Teams include your primary Cardiologist (physician) and Advanced Practice Providers (APPs -  Physician Assistants and Nurse Practitioners) who all work together to provide you with the care you need, when you need it.  We recommend signing up for the patient portal called "MyChart".  Sign up information is provided on this After Visit Summary.  MyChart is used to connect with patients for Virtual Visits (Telemedicine).  Patients are able to view lab/test results, encounter notes, upcoming appointments, etc.  Non-urgent messages can be sent to your provider as well.   To learn more about what you can do with MyChart, go to [email or web address].    Your next appointment:   3 month(s)  Provider:   Eligha Bridegroom, NP    Other Instructions         Signed, Kristeen Miss, MD  03/25/2023 8:52 AM    [facility]

## 2023-03-25 ENCOUNTER — Encounter: Payer: Self-pay | Admitting: Cardiovascular Disease

## 2023-03-25 ENCOUNTER — Ambulatory Visit: Payer: Medicare Other | Attending: Cardiovascular Disease | Admitting: Cardiovascular Disease

## 2023-03-25 VITALS — BP 135/95 | HR 54 | Ht 62.0 in | Wt 141.0 lb

## 2023-03-25 DIAGNOSIS — I10 Essential (primary) hypertension: Secondary | ICD-10-CM | POA: Diagnosis present

## 2023-03-25 DIAGNOSIS — I2510 Atherosclerotic heart disease of native coronary artery without angina pectoris: Secondary | ICD-10-CM | POA: Diagnosis present

## 2023-03-25 DIAGNOSIS — E782 Mixed hyperlipidemia: Secondary | ICD-10-CM | POA: Diagnosis present

## 2023-03-25 NOTE — Patient Instructions (Signed)
Medication Instructions:  Your physician recommends that you continue on your current medications as directed. Please refer to the Current Medication list given to you today.  *If you need a refill on your cardiac medications before your next appointment, please call your pharmacy*  Lab Work: If you have labs (blood work) drawn today and your tests are completely normal, you will receive your results only by: MyChart Message (if you have MyChart) OR A paper copy in the mail If you have any lab test that is abnormal or we need to change your treatment, we will call you to review the results.  Testing/Procedures: None ordered today.  Follow-Up: At [facility], you and your health needs are our priority.  As part of our continuing mission to provide you with exceptional heart care, we have created designated Provider Care Teams.  These Care Teams include your primary Cardiologist (physician) and Advanced Practice Providers (APPs -  Physician Assistants and Nurse Practitioners) who all work together to provide you with the care you need, when you need it.  We recommend signing up for the patient portal called "MyChart".  Sign up information is provided on this After Visit Summary.  MyChart is used to connect with patients for Virtual Visits (Telemedicine).  Patients are able to view lab/test results, encounter notes, upcoming appointments, etc.  Non-urgent messages can be sent to your provider as well.   To learn more about what you can do with MyChart, go to [email or web address].    Your next appointment:   3 month(s)  Provider:   Eligha Bridegroom, NP    Other Instructions

## 2023-05-14 ENCOUNTER — Other Ambulatory Visit: Payer: Self-pay | Admitting: Family Medicine

## 2023-05-14 DIAGNOSIS — Z1231 Encounter for screening mammogram for malignant neoplasm of breast: Secondary | ICD-10-CM

## 2023-06-12 ENCOUNTER — Other Ambulatory Visit: Payer: Self-pay | Admitting: Cardiovascular Disease

## 2023-06-18 ENCOUNTER — Ambulatory Visit
Admission: RE | Admit: 2023-06-18 | Discharge: 2023-06-18 | Disposition: A | Discharge status: Home / Self Care | Source: Ambulatory Visit | Attending: Family Medicine

## 2023-06-18 DIAGNOSIS — Z1231 Encounter for screening mammogram for malignant neoplasm of breast: Secondary | ICD-10-CM

## 2023-06-22 NOTE — Progress Notes (Unsigned)
 Cardiology Office Note:   Date:  06/23/2023  ID:  Felicia, Obrien [birth year], MRN [id number] PCP: Felicia April, DO  [facility] Providers Cardiologist:  Felicia Alert, MD    History of Present Illness:   Discussed the use of AI scribe software for clinical note transcription with the patient, who gave verbal consent to proceed.  Felicia Obrien is a 68 y.o. female who presents for 3 month follow up after seeing Dr. Alroy Obrien in January (previously a Dr. Felipe Obrien patient). 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 History of Present Illness Patient presents for blood pressure management and follow-up.  She experiences elevated blood pressure readings during doctor visits, which she attributes to anxiety related to a fear of elevators. At home, her blood pressure is typically in the normal range. She is currently taking 25 mg of spironolactone  and a small dose of metoprolol . Her blood pressure was 128/74 mmHg during a recent visit to [facility], where she did not have to use an elevator.  She experiences palpitations, particularly at night or after emotional episodes, describing them as 'light breakthrough' PVCs. She is aware of them on average twice a day. She avoids caffeine and alcohol, which she identifies as potential triggers.  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 She is monitored for vascular abnormalities in her abdomen, which were discovered last year. A recent CT scan was clear, and she is scheduled for a follow-up appointment next week.  She has a history of anemia and occasionally low blood sugar, which she believes may contribute to her fatigue. She also reports a history of leg swelling when she was on amlodipine , which resolved  after discontinuation.  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 She also attends the [facility], although less frequently during the winter. She is mindful of her diet, particularly sodium and FODMAP foods, to manage her blood pressure and gastrointestinal comfort.  She has a history of high cholesterol and has experienced side effects from statins, including atorvastatin  and rosuvastatin , which caused head shaking and muscle aches. She is currently on atorvastatin  10 mg, tolerating without side effects.   Studies Reviewed:    EKG:   EKG Interpretation Date/Time:  Monday Obrien 28 2025 13:35:16 EDT Ventricular Rate:  53 PR Interval:  164 QRS Duration:  84 QT Interval:  412 QTC Calculation: 386 R Axis:   21  Text Interpretation: Sinus bradycardia with sinus arrhythmia When compared with ECG of 21-Dec-2015 06:06, No significant change was found Confirmed by Felicia Obrien [phone]) on 06/23/2023 1:38:39 PM    Risk Assessment/Calculations:     HYPERTENSION CONTROL Vitals:   06/23/23 1332 06/23/23 1414  BP: (!) 160/80 (!) 166/86    The patient's blood pressure is elevated above target today.  In order to address the patient's elevated BP: Blood pressure will be monitored at home to determine if medication changes need to be made.; The blood pressure is usually elevated in clinic.  Blood pressures monitored at home have been optimal.; A referral to the PharmD Hypertension Clinic will be placed.           Physical Exam:   VS:  BP (!) 166/86   Pulse (!) 53   Ht 5\' 2"  (1.575 m)   Wt 144 lb 3.2 oz (65.4 kg)  SpO2 96%   BMI 26.37 kg/m    Wt Readings from Last 3 Encounters:  06/23/23 144 lb 3.2 oz (65.4 kg)  03/25/23 141 lb (64 kg)  08/26/22 139 lb (63 kg)     Physical Exam Vitals reviewed.  Constitutional:      Appearance: Normal appearance.  HENT:     Head: Normocephalic.     Nose: Nose normal.  Eyes:     Pupils: Pupils are  equal, round, and reactive to light.  Cardiovascular:     Rate and Rhythm: Normal rate.     Pulses: Normal pulses.     Heart sounds: Normal heart sounds. No murmur heard.    No friction rub. No gallop.     Comments: Slightly irregular (sinus arrhythmia on ECG) Pulmonary:     Effort: Pulmonary effort is normal.     Breath sounds: Normal breath sounds.  Abdominal:     General: Abdomen is flat.  Musculoskeletal:     Right lower leg: No edema.     Left lower leg: No edema.  Skin:    General: Skin is warm and dry.     Capillary Refill: Capillary refill takes less than 2 seconds.  Neurological:     General: No focal deficit present.     Mental Status: She is Obrien and oriented to person, place, and time.  Psychiatric:        Mood and Affect: Mood normal.        Behavior: Behavior normal.        Thought Content: Thought content normal.        Judgment: Judgment normal.    ASSESSMENT AND PLAN:    Assessment & Plan Hypertension Hypertension with elevated blood pressure readings in the clinic, likely in part due to white coat syndrome/anxiety with elevators. Home blood pressure readings are generally within normal range. Current regimen includes spironolactone  and metoprolol . Consideration of reintroducing losartan  at a lower dose if necessary, as it is generally well-tolerated and protects the kidneys. - Monitor blood pressure at home twice daily for one week. - Call in one week to review home blood pressure readings. - Consider reintroducing losartan  at a lower dose if home blood pressure readings are elevated. - Educated on signs of hypertensive emergency, such as severe headache and vision changes, and advise to seek emergency care if these occur.  Palpitations Palpitations with occasional PVCs, more noticeable during emotional stress. Well-controlled with current metoprolol  regimen. Discussed common triggers such as caffeine and alcohol, which she avoids. Educated on recognizing  symptoms of low heart rate and when to seek medical advice. - Continue Metoprolol  Succinate 50mg  - Educate on recognizing symptoms of low heart rate and when to seek medical advice.  Hyperlipidemia Coronary atherosclerosis 2023 CT calcium  scoring test with score of 7.84. Prior adverse effects from statins for patient have included head tremor and muscle aches. Currently on atorvastatin  10 mg with tolerable side effects. Discussed alternative lipid-lowering therapies given LDL not at goal, including PCSK9 inhibitors, and the potential benefits of consulting with a lipid clinic. Emphasized that PCSK9 inhibitors are well-tolerated with minimal side effects and could be a suitable alternative. - Refer to lipid clinic for evaluation of alternative lipid-lowering therapies. - Continue Atorvastatin  10mg  and ASA 81mg   Anemia Recent HGB 11. Per patient, anemia currently under investigation.  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 Recent CT scan clear. Follow-up with vascular specialist scheduled for next week  to monitor vascular abnormalities. - Continue follow-up with vascular specialist.          Signed, Felicia Prince, PA-C

## 2023-06-23 ENCOUNTER — Encounter: Payer: Self-pay | Admitting: Cardiology

## 2023-06-23 ENCOUNTER — Ambulatory Visit: Payer: Medicare Other | Attending: Nurse Practitioner | Admitting: Cardiology

## 2023-06-23 VITALS — BP 166/86 | HR 53 | Ht 62.0 in | Wt 144.2 lb

## 2023-06-23 DIAGNOSIS — Z79899 Other long term (current) drug therapy: Secondary | ICD-10-CM | POA: Insufficient documentation

## 2023-06-23 DIAGNOSIS — I2510 Atherosclerotic heart disease of native coronary artery without angina pectoris: Secondary | ICD-10-CM | POA: Insufficient documentation

## 2023-06-23 DIAGNOSIS — Z9889 Other specified postprocedural states: Secondary | ICD-10-CM | POA: Diagnosis present

## 2023-06-23 DIAGNOSIS — I10 Essential (primary) hypertension: Secondary | ICD-10-CM | POA: Insufficient documentation

## 2023-06-23 DIAGNOSIS — E782 Mixed hyperlipidemia: Secondary | ICD-10-CM | POA: Diagnosis not present

## 2023-06-23 NOTE — Patient Instructions (Signed)
 Medication Instructions:   Your physician recommends that you continue on your current medications as directed. Please refer to the Current Medication list given to you today.  *If you need a refill on your cardiac medications before your next appointment, please call your pharmacy*    You have been referred to HYPERTENSION AND LIPID CLINIC TO BE SEEN IN ONE MONTH OR CLOSE TO THAT TIME FRAME PER EVAN WILLIAMS PA-C    Follow-Up:  3 MONTHS WITH AN EXTENDER IN THE OFFICE

## 2023-08-10 ENCOUNTER — Other Ambulatory Visit: Payer: Self-pay | Admitting: Cardiovascular Disease

## 2023-08-14 ENCOUNTER — Ambulatory Visit: Attending: Cardiovascular Disease | Admitting: Pharmacist Clinician (PhC)/ Clinical Pharmacy Specialist

## 2023-08-14 ENCOUNTER — Encounter: Payer: Self-pay | Admitting: Pharmacist Clinician (PhC)/ Clinical Pharmacy Specialist

## 2023-08-14 DIAGNOSIS — I2510 Atherosclerotic heart disease of native coronary artery without angina pectoris: Secondary | ICD-10-CM

## 2023-08-14 DIAGNOSIS — E782 Mixed hyperlipidemia: Secondary | ICD-10-CM | POA: Diagnosis present

## 2023-08-14 NOTE — Progress Notes (Signed)
 Office Visit    Patient Name: Felicia Obrien Date of Encounter: 08/14/2023  Primary Care Provider:  Mordechai April, DO Primary Cardiologist:  Ahmad Alert, MD  Chief Complaint    Hyperlipidemia   Significant Past Medical History   CAD CAC 7.84 (58th percentile)  HTN Includes white coat and anxiety w/elevators  preDM 1/25 A1c 5.9  palpitations Often with emotional stress, on metoprolol       Allergies  Allergen Reactions   Other Other (See Comments)    Dairy products cause painful red skin welts,  abdominal bloating, aching, constipation   Atorvastatin  Other (See Comments)    Muscle cramps with dose over 10 mg    Rosuvastatin  Other (See Comments)    Muscle cramps and nausea on 20 mg daily dose    History of Present Illness    Felicia Obrien is a 68 y.o. female patient of Dr Alroy Aspen, in the office today to discuss options for cholesterol management.  She notes that she has been on statins for about 20 years and does best with atorvastatin  10 mg.  Higher doses or other statins tend to cause myalgias as well as other symptoms.    Insurance Carrier: Medicare/AARP Plan G   LDL Cholesterol goal: LDL < 70   Current Medications: atorvastatin  10 mg every day   Previously tried:  rosuvastatin  - myalgias, nausea  Family Hx: both parents with elevated cholesterol.  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)   Accessory Clinical Findings   In KPN:  TC 202, TG 117, HDL 52, LDL 130  Lab Results  Component Value Date   CHOL 133 01/08/2016   HDL 33 (L) 01/08/2016   LDLCALC 86 01/08/2016   TRIG 69  01/08/2016   CHOLHDL 4.0 01/08/2016    No results found for: LIPOA  Lab Results  Component Value Date   ALT 95 (H) 07/26/2019   AST 219 (H) 07/26/2019   ALKPHOS 99 07/26/2019   BILITOT 0.8 07/26/2019   Lab Results  Component Value Date   CREATININE 0.80 08/26/2022   BUN 18 08/26/2022   NA 142 08/26/2022   K 4.5 08/26/2022   CL 105 08/26/2022   CO2 26 08/26/2022   No results found for: HGBA1C  Home Medications    Current Outpatient Medications  Medication Sig Dispense Refill   ALPRAZolam  (XANAX ) 0.25 MG tablet Take 0.25 mg by mouth at bedtime as needed for anxiety.     Ascorbic Acid (VITAMIN C) 500 MG CHEW Chew 1 tablet by mouth daily.     aspirin  EC 81 MG tablet Take by mouth.     atorvastatin  (LIPITOR) 10 MG tablet TAKE ONE TABLET BY MOUTH ONE TIME DAILY 90 tablet 1   B Complex Vitamins (VITAMIN B COMPLEX PO) Take 1 tablet by mouth daily.     Biotin 2500 MCG CAPS Take 2 chews by mouth daily.     Cholecalciferol (VITAMIN D) 2000 UNITS CAPS Take 2,000 Units by mouth daily.      cyanocobalamin 100 MCG tablet Take 100 mcg by mouth daily.  famotidine (PEPCID) 10 MG tablet Take 10 mg by mouth 2 (two) times daily.     levothyroxine  (SYNTHROID ) 25 MCG tablet Take 25 mcg by mouth daily.     MAGNESIUM PO Take 166 mg by mouth daily. Gummies     metoprolol  succinate (TOPROL -XL) 50 MG 24 hr tablet TAKE ONE TABLET BY MOUTH ONE TIME DAILY 90 tablet 2   Multiple Vitamin (MULTI-VITAMINS) TABS Take 1 tablet by mouth daily.     Multiple Vitamins-Minerals (HAIR SKIN NAILS) CAPS Take 1 capsule by mouth daily.     Omega-3 Fatty Acids (FISH OIL) 300 MG CAPS Take 2 capsules by mouth daily.     omeprazole (PRILOSEC) 20 MG capsule Take 20 mg by mouth daily.     Probiotic Product (PROBIOTIC DAILY) CAPS Take 1 capsule by mouth daily.     RA KRILL OIL CAPS Take 1 capsule by mouth daily.      spironolactone  (ALDACTONE ) 25 MG tablet TAKE ONE TABLET BY MOUTH ONE TIME DAILY 90 tablet 1   No  current facility-administered medications for this visit.     Assessment & Plan    HLD (hyperlipidemia) Assessment: Patient with ASCVD not at LDL goal of < 70 Most recent LDL 130 on 03/12/23 Has been compliant with moderate intensity statin : atorvastatin  10 mg Not able to tolerate statins secondary to myalgias : higher doses of atorvastatin , rosuvastatin  Reviewed options for lowering LDL cholesterol, including ezetimibe , PCSK-9 inhibitors, bempedoic acid and inclisiran.  Discussed mechanisms of action, dosing, side effects, potential decreases in LDL cholesterol and costs.  Also reviewed potential options for patient assistance.  Plan: Patient would like to have Lp(a) measured before deciding on medication Once lab resulted will review with patient to determine course of action.     Halana Deisher, PharmD CPP [facility] [address]   [place], [place] [address] [phone]  08/14/2023, 9:47 AM

## 2023-08-14 NOTE — Assessment & Plan Note (Signed)
 Assessment: Patient with ASCVD not at LDL goal of < 70 Most recent LDL 130 on 03/12/23 Has been compliant with moderate intensity statin : atorvastatin  10 mg Not able to tolerate statins secondary to myalgias : higher doses of atorvastatin , rosuvastatin  Reviewed options for lowering LDL cholesterol, including ezetimibe , PCSK-9 inhibitors, bempedoic acid and inclisiran.  Discussed mechanisms of action, dosing, side effects, potential decreases in LDL cholesterol and costs.  Also reviewed potential options for patient assistance.  Plan: Patient would like to have Lp(a) measured before deciding on medication Once lab resulted will review with patient to determine course of action.

## 2023-08-14 NOTE — Patient Instructions (Signed)
 Your Results:             Your most recent labs Goal  Total Cholesterol 202 < 200  Triglycerides 117 < 150  HDL (happy/good cholesterol) 52 > 40  LDL (lousy/bad cholesterol 130 < 70    Lab orders:  Let's get an Lp(a) lab drawn to look at further risk.    We can consider Repatha vs Leqvio for cholesterol lowering.  I will send you the information on both medications in MyChart   Thank you for choosing [facility]

## 2023-09-22 ENCOUNTER — Encounter: Payer: Self-pay | Admitting: Cardiology

## 2023-09-22 ENCOUNTER — Ambulatory Visit: Attending: Cardiology | Admitting: Cardiology

## 2023-09-22 VITALS — BP 150/86 | HR 81 | Ht 62.0 in | Wt 139.0 lb

## 2023-09-22 DIAGNOSIS — E782 Mixed hyperlipidemia: Secondary | ICD-10-CM | POA: Diagnosis present

## 2023-09-22 DIAGNOSIS — I7102 Dissection of abdominal aorta: Secondary | ICD-10-CM | POA: Insufficient documentation

## 2023-09-22 DIAGNOSIS — I10 Essential (primary) hypertension: Secondary | ICD-10-CM | POA: Insufficient documentation

## 2023-09-22 DIAGNOSIS — I2510 Atherosclerotic heart disease of native coronary artery without angina pectoris: Secondary | ICD-10-CM | POA: Insufficient documentation

## 2023-09-22 NOTE — Patient Instructions (Signed)
 Medication Instructions:  NO CHANGES *If you need a refill on your cardiac medications before your next appointment, please call your pharmacy*  Lab Work: NO LABS If you have labs (blood work) drawn today and your tests are completely normal, you will receive your results only by: MyChart Message (if you have MyChart) OR A paper copy in the mail If you have any lab test that is abnormal or we need to change your treatment, we will call you to review the results.  Testing/Procedures: NO TESTING  Follow-Up: At [facility], you and your health needs are our priority.  As part of our continuing mission to provide you with exceptional heart care, our providers are all part of one team.  This team includes your primary Cardiologist (physician) and Advanced Practice Providers or APPs (Physician Assistants and Nurse Practitioners) who all work together to provide you with the care you need, when you need it.  Your next appointment:   6 month(s)  Provider:   Wendie Hamburg, MD

## 2023-09-22 NOTE — Progress Notes (Signed)
 Cardiology Office Note:   Date:  09/22/2023  ID:  Leahna, Hewson 05-02-1955, MRN [id number] PCP: Dayna Motto, DO  [facility] Providers Cardiologist:  Lonni LITTIE Nanas, MD    History of Present Illness:   Discussed the use of AI scribe software for clinical note transcription with the patient, who gave verbal consent to proceed.  History of Present Illness Felicia Obrien is a 68 year old female with hyperlipidemia, hypertension, palpitations, and aortic atherosclerosis who presents for cardiovascular follow-up.  She has a history of an infrarenal aortic dissection noted in a CT scan following a Whipple procedure. She follows up with vascular surgery at [facility] and monitors her blood pressure regularly due to her aortic history. Her blood pressure readings at home are stable, typically around 117-120/70s, with almost no readings above 130. Per patient, PRN follow up only with vascular surgery.  She undergoes annual scans for a neuroendocrine tumor, which also monitors the aortic area.  Regarding patient's hyperlipidemia, since her last visit with me, was seen by PharmD team due to history of statin intolerance, myalgias. She has done relatively well on Atorvastatin  10mg  but cholesterol is not at goal. Decision made at this visit to evaluate lipoprotein A. This was was not completed with us  but rather she recently had a blood draw for lipoprotein A as ordered by per PCP. Resulted value 200.   She still experiences palpitations, particularly in the evening around 8-9 PM. She currently takes her metoprolol  in the morning  She has noticed some weight gain and is working on reducing it.   Today patient denies chest pain, shortness of breath, lower extremity edema, fatigue, palpitations, melena, hematuria, hemoptysis, diaphoresis, weakness, presyncope, syncope, orthopnea, and PND.   Studies Reviewed:    EKG:          Risk Assessment/Calculations:      HYPERTENSION CONTROL Vitals:   09/22/23 1331 09/22/23 1345  BP: (!) 142/80 (!) 150/86    The patient's blood pressure is elevated above target today.  In order to address the patient's elevated BP: The blood pressure is usually elevated in clinic.  Blood pressures monitored at home have been optimal.           Physical Exam:   VS:  BP (!) 150/86   Pulse 81   Ht 5' 2 (1.575 m)   Wt 139 lb (63 kg)   SpO2 99%   BMI 25.42 kg/m    Wt Readings from Last 3 Encounters:  09/22/23 139 lb (63 kg)  06/23/23 144 lb 3.2 oz (65.4 kg)  03/25/23 141 lb (64 kg)     Physical Exam Vitals reviewed.  Constitutional:      Appearance: Normal appearance.  HENT:     Head: Normocephalic.     Nose: Nose normal.  Eyes:     Pupils: Pupils are equal, round, and reactive to light.  Cardiovascular:     Rate and Rhythm: Normal rate and regular rhythm.     Pulses: Normal pulses.     Heart sounds: Normal heart sounds. No murmur heard.    No friction rub. No gallop.  Pulmonary:     Effort: Pulmonary effort is normal.     Breath sounds: Normal breath sounds.  Abdominal:     General: Abdomen is flat.  Musculoskeletal:     Right lower leg: No edema.     Left lower leg: No edema.  Skin:    General: Skin is warm and dry.  Capillary Refill: Capillary refill takes less than 2 seconds.  Neurological:     General: No focal deficit present.     Mental Status: She is alert and oriented to person, place, and time.  Psychiatric:        Mood and Affect: Mood normal.        Behavior: Behavior normal.        Thought Content: Thought content normal.        Judgment: Judgment normal.      ASSESSMENT AND PLAN:    Assessment & Plan Hypertension Blood pressure readings at home are consistently around 117-120/70, within the target range. No significant elevations above 130/80 reported. Blood pressure control is crucial due to aortic dissection. - Continue current antihypertensive regimen. - Monitor  blood pressure at home once daily, preferably in the morning after taking morning medications. - Report any consistent readings above 130/80.  Palpitations Palpitations occur mostly in the evening, around 8-9 PM. Current metoprolol  succinate regimen may not be optimally timed for symptom control. - Consider taking metoprolol  succinate 50mg  at dinnertime instead of in the morning. - Alternatively, split the metoprolol  dose to 25 mg in the morning and 25 mg at night.  Hyperlipidemia Coronary atherosclerosis.  2023 CT calcium  scoring test with score of 7.84. Prior adverse effects from statins for patient have included head tremor and muscle aches. Currently on atorvastatin  10 mg with tolerable side effects. 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 Discussion about the potential use of PCSK9 inhibitors due to elevated lipoprotein A and LDL. ApoB measurement was considered but deemed unnecessary at this time given the lipoprotein A results. Discussed that PCSK9 inhibitors can reduce elevated lipoprotein A by about 20%. - Send lipoprotein A results to lipid clinic team for further evaluation of PCSK9 initiation. - Continue ASA 81mg   Infrarenal aortic dissection The infrarenal aortic dissection is well-managed with no new symptoms or changes. Follow-up with vascular surgery at [facility] is on an as-needed basis. Annual scans for neuroendocrine tumor will incidentally monitor the aorta. - Continue annual scans for neuroendocrine tumor, which will also monitor the aorta. - No immediate follow-up with vascular surgery unless changes are noted in imaging.             Signed, Artist Pouch, PA-C

## 2023-09-24 ENCOUNTER — Telehealth: Payer: Self-pay | Admitting: Pharmacist Clinician (PhC)/ Clinical Pharmacy Specialist

## 2023-09-24 NOTE — Telephone Encounter (Signed)
 Spoke with patient. Had Lp(a) drawn and was 200.  Reviewed options that we spoke about at June appointment.  Pt would like to think about this new information (Repatha vs Leqvio).  Will call her back on Friday to answer any further questions.

## 2023-09-26 NOTE — Telephone Encounter (Signed)
 Patient would like to discuss with her husband.  Will send her a MyChart message on Aug 18 and she can reply to that.  Patient agreeable to this plan.

## 2023-11-25 NOTE — Discharge Summary (Addendum)
 Discharge to Outside Facility: RN  Report called to *** by *** at *** Discharge Unit: [facility] Discharge Unit Phone #: ***  Transport Equipment/Life Support Measures Needed    IV Access:     Peripheral IV 11/25/23 Right Wrist (Active)  Site Assessment Clean, Dry, Intact 11/25/23 1103  Joint Stabilization None 11/25/23 1103  Line Status/ Intervention Infusing;Saline flushed;Patent;Blood return noted 11/25/23 1103  Dressing Status/ Intervention Clean, Dry, Intact 11/25/23 1103  Number of days: 0                    Oxygen: via O2 Device: Partial rebreather mask (11/25/23 1255)   Pulse Oximeter: SpO2: 99 % (11/25/23 1300)    Drains/Tubes:          Precautions/Positioning:    Vital Signs and Patient Condition At Time of Transfer    Vitals:     Vitals:   11/25/23 1300  BP: (!) 185/80  Pulse: 60  Resp: 21  Temp:      Mental Status:   (WDL = Within Defined Limits)                       Pain:    Pain Assessment %%: Wong-Baker FACES (11/25/23 1255)    Pain Score %%: 0-No pain (11/25/23 1255)       Nursing Summary    Activities of Daily Living:                Sensory Aides:   Vision - Corrective Lenses: Glasses (11/25/23 1039)        Elimination:                   Nutrition:               Skin Condition / Wounds:     Wound 04/29/18 Anterior Abdomen, Entire (Active)  Number of days: 2036    (RN to validate provider order for wound care)  Signed By: LOVELL MOLT

## 2023-12-18 ENCOUNTER — Telehealth: Payer: Self-pay | Admitting: Cardiology

## 2023-12-18 NOTE — Telephone Encounter (Signed)
 Spoke with pt who has recently (a few months ago) developed pancreatitis/pancreatic insuffiencey.    This has been being treated at [facility].  During this treatment she has lost appr 20 lbs.  BP has been great 105/65 and she had discontinued spironolactone  25 mg daily about 4 weeks ago.  Yesterday, she reports, all the sudden her BP was 142/81 on the same BP monitor she usually checks it with.  She also reports a headache. Today BP was 145/80.   She is asking if she should restart the Spironolactone .  She does report sometimes her blood pressure can be all over the place.  Advised pt to continue to monitor and keep a diary as typically medications are not changed d/t one or two readings.  Requested she monitor for 1 week to 10 days then send the results to us  via MyChart.  Pt is aware I will forward this to both Dr Kate (has not seen yet but is scheduled with-prior Nahser pt) and Artist Pouch, PA who she did see in July for their knowledge and review.

## 2023-12-18 NOTE — Telephone Encounter (Signed)
 Pt c/o BP issue: STAT if pt c/o blurred vision, one-sided weakness or slurred speech  1. What are your last 5 BP readings? 145/80 today   2. Are you having any other symptoms (ex. Dizziness, headache, blurred vision, passed out)? Headaches.   3. What is your BP issue? Pt has concerns and would like to speak further with a nurse.   Pt c/o medication issue:  1. Name of Medication: spironolactone  (ALDACTONE ) 25 MG tablet   2. How are you currently taking this medication (dosage and times per day)?  TAKE ONE TABLET BY MOUTH ONE TIME DAILY     3. Are you having a reaction (difficulty breathing--STAT)? No  4. What is your medication issue? Pt hasn't taken this medication in 6 weeks and stated it was causing bp to be very low. She stated she's been having pancreatic issue and loss a lot of weight as well but she'd like to discuss medication dosage and adjustments since having the issues. Please advise

## 2023-12-18 NOTE — Telephone Encounter (Signed)
 If blood pressures are up and down, would continue to hold the Spironolactone . Please arrange follow up with one of the general cardiology APPs.   Artist Pouch, PA-C  Pt is aware of the above information.  She will contact the office with BP readings in about 10 days.

## 2024-02-25 ENCOUNTER — Other Ambulatory Visit: Payer: Self-pay

## 2024-02-27 MED ORDER — METOPROLOL SUCCINATE ER 50 MG PO TB24: 50.0000 mg | ORAL_TABLET | Freq: Every day | ORAL | 1 refills | Status: AC

## 2024-02-27 MED ORDER — ATORVASTATIN CALCIUM 10 MG PO TABS: 10.0000 mg | ORAL_TABLET | Freq: Every day | ORAL | 1 refills | Status: AC

## 2024-02-27 MED ORDER — SPIRONOLACTONE 25 MG PO TABS: 25.0000 mg | ORAL_TABLET | Freq: Every day | ORAL | 1 refills | Status: AC

## 2024-03-24 NOTE — Progress Notes (Unsigned)
 " Cardiology Office Note:    Date:  03/25/2024   ID:  [name], [name] 29-Sep-1955, MRN [id number]  PCP:  Felicia Motto, DO  Cardiologist:  Lonni LITTIE Nanas, MD  Electrophysiologist:  None   Referring MD: Felicia Motto, DO   Chief Complaint  Patient presents with   Palpitations   History of Present Illness:    Felicia Obrien is a 69 y.o. female with a hx of chronic pancreatitis, infrarenal aortic dissection, hypertension, hyperlipidemia who presents for follow-up.  Previously followed with Dr. Alveta.  Found to have infrarenal aortic dissection following Whipple procedure, follows with vascular surgery at [facility].  Echocardiogram 11/2015 showed EF 55 to 60%, moderate left atrial enlargement.  Calcium  score 8 (50th percentile) on 04/2021.  Since last clinic visit, she reports she is doing okay.  Denies any chest pain, dyspnea,  lower extremity edema.  She reports she was having lightheadedness in December but started iron supplements and it is resolved.  Denies any syncopal episodes.  Reports having palpitations about once per month, lasts for 30 minutes and feels like heart is skipping beats.  Reports BP has been 120s when checks at home.   Past Medical History:  Diagnosis Date   Abnormal Pap smear of cervix    Chronic pancreatitis (HCC)    GERD (gastroesophageal reflux disease)    Hyperlipidemia    Hypertension    PAC (premature atrial contraction)     Past Surgical History:  Procedure Laterality Date   CERVICAL CONE BIOPSY     WHIPPLE PROCEDURE  04/2018    Current Medications: Active Medications[1]   Allergies:   Other, Atorvastatin , and Rosuvastatin    Social History   Socioeconomic History   Marital status: Married    Spouse name: Not on file   Number of children: Not on file   Years of education: Not on file   Highest education level: Not on file  Occupational History   Not on file  Tobacco Use   Smoking status: Never   Smokeless tobacco:  Never  Vaping Use   Vaping status: Never Used  Substance and Sexual Activity   Alcohol use: No   Drug use: No   Sexual activity: Not on file  Other Topics Concern   Not on file  Social History Narrative   Not on file   Social Drivers of Health   Tobacco Use: Low Risk (03/25/2024)   Patient History    Smoking Tobacco Use: Never    Smokeless Tobacco Use: Never    Passive Exposure: Not on file  Financial Resource Strain: Not on file  Food Insecurity: Not on file  Transportation Needs: Not on file  Physical Activity: Not on file  Stress: Not on file  Social Connections: Not on file  Depression (EYV7-0): Not on file  Alcohol Screen: Not on file  Housing: Unknown (06/20/2023)   Received from [facility]   Epic    Unable to Pay for Housing in the Last Year: Not on file    Number of Times Moved in the Last Year: Not on file    At any time in the past 12 months, were you homeless or living in a shelter (including now)?: No  Utilities: Not on file  Health Literacy: Not on file     Family History: The patient's family history includes Breast cancer (age of onset: 29) in her mother; Cancer in her mother; Heart disease in her father; Hyperlipidemia in her brother, brother, father,  and sister; Hypertension in her father.  ROS:   Please see the history of present illness.     All other systems reviewed and are negative.  EKGs/Labs/Other Studies Reviewed:    The following studies were reviewed today:   EKG:   03/25/2024: Normal sinus rhythm, rate 62, no ST abnormalities  Recent Labs: No results found for requested labs within last 365 days.  Recent Lipid Panel    Component Value Date/Time   CHOL 133 01/08/2016 0730   TRIG 69 01/08/2016 0730   HDL 33 (L) 01/08/2016 0730   CHOLHDL 4.0 01/08/2016 0730   VLDL 14 01/08/2016 0730   LDLCALC 86 01/08/2016 0730    Physical Exam:    VS:  BP (!) 162/90 (BP Location: Left Arm, Patient Position: Sitting, Cuff  Size: Normal)   Pulse 62   Ht 5' 2 (1.575 m)   Wt 137 lb (62.1 kg)   SpO2 97%   BMI 25.06 kg/m     Wt Readings from Last 3 Encounters:  03/25/24 137 lb (62.1 kg)  09/22/23 139 lb (63 kg)  06/23/23 144 lb 3.2 oz (65.4 kg)     GEN:  Well nourished, well developed in no acute distress HEENT: Normal NECK: No JVD; No carotid bruits LYMPHATICS: No lymphadenopathy CARDIAC: RRR, no murmurs, rubs, gallops RESPIRATORY:  Clear to auscultation without rales, wheezing or rhonchi  ABDOMEN: Soft, non-tender, non-distended MUSCULOSKELETAL:  No edema; No deformity  SKIN: Warm and dry NEUROLOGIC:  Alert and oriented x 3 PSYCHIATRIC:  Normal affect   ASSESSMENT:    1. Palpitations   2. Mixed hyperlipidemia   3. Coronary artery calcification   4. Essential hypertension   5. Dissection of abdominal aorta (HCC)    PLAN:    Palpitations: Description concerning for arrhythmia, will evaluate with Zio patch x 2 weeks  Hypertension: On Toprol -XL 50 mg daily and spironolactone  25 mg daily.  BP elevated in clinic today reports under good control when checks at home, asked to check BP twice daily for next week and let us  know results  Hyperlipidemia: On atorvastatin  10 mg daily.  Unable to tolerate higher dose statin due to myalgias.  Calcium  score 8 (50th percentile) on 04/2021.  LDL 145 on 02/2024 - Reports previous intolerance to Zetia  but she is unclear of what issue was and would like to try Zetia  again.  Will prescribe Zetia  10 mg daily and check fasting lipid panel in 3 months  Aortic dissection: Found to haveinfrarenal aortic dissection following Whipple procedure, follows with vascular surgery at [facility] as needed.  Undergoes annual scan for neuroendocrine tumor, incidentally monitors the aorta and has vascular surgery follow-up if any changes noted  RTC in 6 months  Medication Adjustments/Labs and Tests Ordered: Current medicines are reviewed at length with the patient today.  Concerns  regarding medicines are outlined above.  Orders Placed This Encounter  Procedures   Lipid panel   LONG TERM MONITOR (3-14 DAYS)   EKG 12-Lead   Meds ordered this encounter  Medications   ezetimibe  (ZETIA ) 10 MG tablet    Sig: Take 1 tablet (10 mg total) by mouth daily.    Dispense:  90 tablet    Refill:  3    Patient Instructions  Medication Instructions:  Your physician has recommended you make the following change in your medication:  START: Zetia  10 mg once daily  *If you need a refill on your cardiac medications before your next appointment, please call your pharmacy*  Please take your blood pressure twice daily for 1 week and send in a MyChart message. Please include heart rates. (One message at the end of the week).   HOW TO TAKE YOUR BLOOD PRESSURE: Rest 5 minutes before taking your blood pressure. Dont smoke or drink caffeinated beverages for at least 30 minutes before. Take your blood pressure before (not after) you eat. Sit comfortably with your back supported and both feet on the floor (dont cross your legs). Elevate your arm to heart level on a table or a desk. Use the proper sized cuff. It should fit smoothly and snugly around your bare upper arm. There should be enough room to slip a fingertip under the cuff. The bottom edge of the cuff should be 1 inch above the crease of the elbow. Ideally, take 3 measurements at one sitting and record the average.  Blood Pressure Log Date/Time Medications taken? (Y/N) Blood Pressure Heart Rate/Pulse                                                                                                                                                                                         Lab Work: In 3 months: Lipids - please come fasting  Location: [facility]  1st floor (beside pharmacy)  Address: [address] Hours: 8am-4:30p, no appointment is  needed  If you have labs (blood work) drawn today and your tests are completely normal, you will receive your results only by: MyChart Message (if you have MyChart) OR A paper copy in the mail If you have any lab test that is abnormal or we need to change your treatment, we will call you to review the results.  Testing/Procedures: GEOFFRY HEWS- Long Term Monitor Instructions  Your physician has requested you wear a ZIO patch monitor for 14 days.  This is a single patch monitor. [employer] supplies one patch monitor per enrollment. Additional stickers are not available. Please do not apply patch if you will be having a Nuclear Stress Test,  Echocardiogram, Cardiac CT, MRI, or Chest Xray during the period you would be wearing the  monitor. The patch cannot be worn during these tests. You cannot remove and re-apply the  ZIO XT patch monitor.  Your ZIO patch monitor will be mailed 3 day USPS to your address on file. It may take 3-5 days  to receive your monitor after you have been enrolled.  Once you have received your monitor, please review the enclosed instructions. Your monitor  has already been registered assigning a specific monitor serial # to you.  Billing and Patient Assistance Program Information  We have supplied [employer] with any of your insurance information on file for billing purposes. [employer] offers a sliding scale Patient Assistance Program for patients that do not have  insurance, or whose insurance does not completely cover the cost of the ZIO monitor.  You must apply for the Patient Assistance Program to qualify for this discounted rate.  To apply, please call [employer] at [phone], select option 4, select option 2, ask to apply for  Patient Assistance Program. [place] will ask your household income, and how many people  are in your household. They will quote your out-of-pocket cost based on that information.  [employer] will also be able to set up a 68-month, interest-free payment  plan if needed.  Applying the monitor   Shave hair from upper left chest.  Hold abrader disc by orange tab. Rub abrader in 40 strokes over the upper left chest as  indicated in your monitor instructions.  Clean area with 4 enclosed alcohol pads. Let dry.  Apply patch as indicated in monitor instructions. Patch will be placed under collarbone on left  side of chest with arrow pointing upward.  Rub patch adhesive wings for 2 minutes. Remove white label marked 1. Remove the white  label marked 2. Rub patch adhesive wings for 2 additional minutes.  While looking in a mirror, press and release button in center of patch. A small green light will  flash 3-4 times. This will be your only indicator that the monitor has been turned on.  Do not shower for the first 24 hours. You may shower after the first 24 hours.  Press the button if you feel a symptom. You will hear a small click. Record Date, Time and  Symptom in the Patient Logbook.  When you are ready to remove the patch, follow instructions on the last 2 pages of Patient  Logbook. Stick patch monitor onto the last page of Patient Logbook.  Place Patient Logbook in the blue and white box. Use locking tab on box and tape box closed  securely. The blue and white box has prepaid postage on it. Please place it in the mailbox as  soon as possible. Your physician should have your test results approximately 7 days after the  monitor has been mailed back to [facility].  Call [facility] Customer Care at [phone] if you have questions regarding  your ZIO XT patch monitor. Call them immediately if you see an orange light blinking on your  monitor.  If your monitor falls off in less than 4 days, contact our Monitor department at [phone].  If your monitor becomes loose or falls off after 4 days call [employer] at [phone] for  suggestions on securing your monitor   Follow-Up: At [facility], you and your health needs  are our priority.  As part of our continuing mission to provide you with exceptional heart care, our providers are all part of one team.  This team includes your primary Cardiologist (physician) and Advanced Practice Providers or APPs (Physician Assistants and Nurse Practitioners) who all work together to provide you with the care you need, when you need it.  Your next appointment:   6 month(s) please contact the office in April to get appointment set up  Provider:   Lonni LITTIE Nanas, MD    Other Instructions:            Signed, Lonni LITTIE Nanas, MD  03/25/2024 9:51 AM    [facility]     [  1]  Current Meds  Medication Sig   ALPRAZolam  (XANAX ) 0.25 MG tablet Take 0.25 mg by mouth at bedtime as needed for anxiety.   Ascorbic Acid (VITAMIN C) 500 MG CHEW Chew 1 tablet by mouth daily.   aspirin  EC 81 MG tablet Take by mouth.   atorvastatin  (LIPITOR) 10 MG tablet Take 1 tablet (10 mg total) by mouth daily.   B Complex Vitamins (VITAMIN B COMPLEX PO) Take 1 tablet by mouth daily.   Biotin 2500 MCG CAPS Take 2 chews by mouth daily.   Cholecalciferol (VITAMIN D) 2000 UNITS CAPS Take 2,000 Units by mouth daily.    CREON  12000-38000 units CPEP capsule Take 12,000 Units by mouth 3 (three) times daily before meals. Taking (4) with each meal (2) with 2 snacks daily   ezetimibe  (ZETIA ) 10 MG tablet Take 1 tablet (10 mg total) by mouth daily.   levothyroxine  (SYNTHROID ) 25 MCG tablet Take 25 mcg by mouth daily. (Patient taking differently: Take 50 mcg by mouth daily.)   MAGNESIUM PO Take 166 mg by mouth daily. Gummies   metoprolol  succinate (TOPROL -XL) 50 MG 24 hr tablet Take 1 tablet (50 mg total) by mouth daily.   Multiple Vitamin (MULTI-VITAMINS) TABS Take 1 tablet by mouth daily.   Multiple Vitamins-Minerals (HAIR SKIN NAILS) CAPS Take 1 capsule by mouth daily.   Omega-3 Fatty Acids (FISH OIL) 300 MG CAPS Take 2 capsules by mouth daily.   omeprazole  (PRILOSEC) 20 MG capsule Take 20 mg by mouth daily.   Probiotic Product (PROBIOTIC DAILY) CAPS Take 1 capsule by mouth daily.   RA KRILL OIL CAPS Take 1 capsule by mouth daily.    spironolactone  (ALDACTONE ) 25 MG tablet Take 1 tablet (25 mg total) by mouth daily.   "

## 2024-03-25 ENCOUNTER — Ambulatory Visit

## 2024-03-25 ENCOUNTER — Ambulatory Visit: Attending: Cardiology | Admitting: Cardiology

## 2024-03-25 ENCOUNTER — Encounter: Payer: Self-pay | Admitting: Cardiology

## 2024-03-25 VITALS — BP 162/90 | HR 62 | Ht 62.0 in | Wt 137.0 lb

## 2024-03-25 DIAGNOSIS — E782 Mixed hyperlipidemia: Secondary | ICD-10-CM | POA: Diagnosis present

## 2024-03-25 DIAGNOSIS — I10 Essential (primary) hypertension: Secondary | ICD-10-CM | POA: Insufficient documentation

## 2024-03-25 DIAGNOSIS — I7102 Dissection of abdominal aorta: Secondary | ICD-10-CM | POA: Diagnosis present

## 2024-03-25 DIAGNOSIS — R002 Palpitations: Secondary | ICD-10-CM | POA: Diagnosis present

## 2024-03-25 DIAGNOSIS — I2510 Atherosclerotic heart disease of native coronary artery without angina pectoris: Secondary | ICD-10-CM | POA: Diagnosis present

## 2024-03-25 MED ORDER — EZETIMIBE 10 MG PO TABS: 10.0000 mg | ORAL_TABLET | Freq: Every day | ORAL | 3 refills | Status: AC

## 2024-03-25 NOTE — Progress Notes (Unsigned)
 Enrolled patient for a 14 day Zio XT  monitor to be mailed to patients home

## 2024-03-25 NOTE — Patient Instructions (Addendum)
 Medication Instructions:  Your physician has recommended you make the following change in your medication:  START: Zetia  10 mg once daily  *If you need a refill on your cardiac medications before your next appointment, please call your pharmacy*  Please take your blood pressure twice daily for 1 week and send in a MyChart message. Please include heart rates. (One message at the end of the week).   HOW TO TAKE YOUR BLOOD PRESSURE: Rest 5 minutes before taking your blood pressure. Dont smoke or drink caffeinated beverages for at least 30 minutes before. Take your blood pressure before (not after) you eat. Sit comfortably with your back supported and both feet on the floor (dont cross your legs). Elevate your arm to heart level on a table or a desk. Use the proper sized cuff. It should fit smoothly and snugly around your bare upper arm. There should be enough room to slip a fingertip under the cuff. The bottom edge of the cuff should be 1 inch above the crease of the elbow. Ideally, take 3 measurements at one sitting and record the average.  Blood Pressure Log Date/Time Medications taken? (Y/N) Blood Pressure Heart Rate/Pulse                                                                                                                                                                                         Lab Work: In 3 months: Lipids - please come fasting  Location: [facility]  1st floor (beside pharmacy)  Address: [address] Hours: 8am-4:30p, no appointment is needed  If you have labs (blood work) drawn today and your tests are completely normal, you will receive your results only by: MyChart Message (if you have MyChart) OR A paper copy in the mail If you have any lab test that is abnormal or we need to change your treatment, we will call you to review the results.  Testing/Procedures: GEOFFRY HEWS- Long Term  Monitor Instructions  Your physician has requested you wear a ZIO patch monitor for 14 days.  This is a single patch monitor. [employer] supplies one patch monitor per enrollment. Additional stickers are not available. Please do not apply patch if you will be having a Nuclear Stress Test,  Echocardiogram, Cardiac CT, MRI, or Chest Xray during the period you would be wearing the  monitor. The patch cannot be worn during these tests. You cannot remove and re-apply the  ZIO XT patch monitor.  Your ZIO patch monitor will be mailed 3 day USPS to your address on file. It may take 3-5 days  to receive your  monitor after you have been enrolled.  Once you have received your monitor, please review the enclosed instructions. Your monitor  has already been registered assigning a specific monitor serial # to you.  Billing and Patient Assistance Program Information  We have supplied [employer] with any of your insurance information on file for billing purposes. [employer] offers a sliding scale Patient Assistance Program for patients that do not have  insurance, or whose insurance does not completely cover the cost of the ZIO monitor.  You must apply for the Patient Assistance Program to qualify for this discounted rate.  To apply, please call [employer] at [phone], select option 4, select option 2, ask to apply for  Patient Assistance Program. [place] will ask your household income, and how many people  are in your household. They will quote your out-of-pocket cost based on that information.  [employer] will also be able to set up a 67-month, interest-free payment plan if needed.  Applying the monitor   Shave hair from upper left chest.  Hold abrader disc by orange tab. Rub abrader in 40 strokes over the upper left chest as  indicated in your monitor instructions.  Clean area with 4 enclosed alcohol pads. Let dry.  Apply patch as indicated in monitor instructions. Patch will be placed under collarbone on left   side of chest with arrow pointing upward.  Rub patch adhesive wings for 2 minutes. Remove white label marked 1. Remove the white  label marked 2. Rub patch adhesive wings for 2 additional minutes.  While looking in a mirror, press and release button in center of patch. A small green light will  flash 3-4 times. This will be your only indicator that the monitor has been turned on.  Do not shower for the first 24 hours. You may shower after the first 24 hours.  Press the button if you feel a symptom. You will hear a small click. Record Date, Time and  Symptom in the Patient Logbook.  When you are ready to remove the patch, follow instructions on the last 2 pages of Patient  Logbook. Stick patch monitor onto the last page of Patient Logbook.  Place Patient Logbook in the blue and white box. Use locking tab on box and tape box closed  securely. The blue and white box has prepaid postage on it. Please place it in the mailbox as  soon as possible. Your physician should have your test results approximately 7 days after the  monitor has been mailed back to [facility].  Call [facility] Customer Care at [phone] if you have questions regarding  your ZIO XT patch monitor. Call them immediately if you see an orange light blinking on your  monitor.  If your monitor falls off in less than 4 days, contact our Monitor department at [phone].  If your monitor becomes loose or falls off after 4 days call [employer] at [phone] for  suggestions on securing your monitor   Follow-Up: At [facility], you and your health needs are our priority.  As part of our continuing mission to provide you with exceptional heart care, our providers are all part of one team.  This team includes your primary Cardiologist (physician) and Advanced Practice Providers or APPs (Physician Assistants and Nurse Practitioners) who all work together to provide you with the care you need, when you need  it.  Your next appointment:   6 month(s) please contact the office in April to get appointment set up  Provider:  Lonni LITTIE Nanas, MD    Other Instructions:

## 2024-03-29 ENCOUNTER — Encounter: Payer: Self-pay | Admitting: Cardiology
